# Patient Record
Sex: Male | Born: 1945 | Race: White | Hispanic: No | Marital: Married | Smoking: Former smoker
Health system: Southern US, Community
[De-identification: ages and names within clinical notes are randomized; demographics above are authoritative.]

## PROBLEM LIST (undated history)

## (undated) DIAGNOSIS — I639 Cerebral infarction, unspecified: Secondary | ICD-10-CM

## (undated) DIAGNOSIS — T8859XA Other complications of anesthesia, initial encounter: Secondary | ICD-10-CM

## (undated) DIAGNOSIS — E785 Hyperlipidemia, unspecified: Secondary | ICD-10-CM

## (undated) DIAGNOSIS — Z87898 Personal history of other specified conditions: Secondary | ICD-10-CM

## (undated) DIAGNOSIS — C801 Malignant (primary) neoplasm, unspecified: Secondary | ICD-10-CM

## (undated) DIAGNOSIS — G610 Guillain-Barre syndrome: Secondary | ICD-10-CM

## (undated) DIAGNOSIS — I10 Essential (primary) hypertension: Secondary | ICD-10-CM

## (undated) DIAGNOSIS — G4730 Sleep apnea, unspecified: Secondary | ICD-10-CM

## (undated) DIAGNOSIS — N401 Enlarged prostate with lower urinary tract symptoms: Secondary | ICD-10-CM

## (undated) DIAGNOSIS — N138 Other obstructive and reflux uropathy: Secondary | ICD-10-CM

## (undated) DIAGNOSIS — T4145XA Adverse effect of unspecified anesthetic, initial encounter: Secondary | ICD-10-CM

## (undated) DIAGNOSIS — R569 Unspecified convulsions: Secondary | ICD-10-CM

## (undated) DIAGNOSIS — M1990 Unspecified osteoarthritis, unspecified site: Secondary | ICD-10-CM

## (undated) HISTORY — PX: KNEE SURGERY: SHX244

## (undated) HISTORY — PX: HERNIA REPAIR: SHX51

## (undated) HISTORY — DX: Hyperlipidemia, unspecified: E78.5

## (undated) HISTORY — DX: Benign prostatic hyperplasia with lower urinary tract symptoms: N40.1

## (undated) HISTORY — DX: Cerebral infarction, unspecified: I63.9

## (undated) HISTORY — PX: TONSILLECTOMY: SUR1361

## (undated) HISTORY — DX: Essential (primary) hypertension: I10

## (undated) HISTORY — PX: BRAIN SURGERY: SHX531

## (undated) HISTORY — PX: MOLE REMOVAL: SHX2046

## (undated) HISTORY — PX: PILONIDAL CYST EXCISION: SHX744

## (undated) HISTORY — PX: INGUINAL HERNIA REPAIR: SUR1180

## (undated) HISTORY — DX: Other obstructive and reflux uropathy: N13.8

## (undated) HISTORY — DX: Guillain-Barre syndrome: G61.0

## (undated) HISTORY — DX: Unspecified convulsions: R56.9

---

## 1998-08-12 ENCOUNTER — Ambulatory Visit (HOSPITAL_COMMUNITY): Admission: RE | Admit: 1998-08-12 | Discharge: 1998-08-12 | Payer: Self-pay | Admitting: Family Medicine

## 1998-08-12 ENCOUNTER — Encounter: Payer: Self-pay | Admitting: Family Medicine

## 1998-09-01 ENCOUNTER — Ambulatory Visit (HOSPITAL_COMMUNITY): Admission: RE | Admit: 1998-09-01 | Discharge: 1998-09-01 | Payer: Self-pay | Admitting: Family Medicine

## 1998-09-01 ENCOUNTER — Encounter: Payer: Self-pay | Admitting: Family Medicine

## 2000-03-28 DIAGNOSIS — G610 Guillain-Barre syndrome: Secondary | ICD-10-CM

## 2000-03-28 HISTORY — DX: Guillain-Barre syndrome: G61.0

## 2004-03-28 HISTORY — PX: COLONOSCOPY: SHX174

## 2004-04-05 ENCOUNTER — Ambulatory Visit (HOSPITAL_COMMUNITY): Admission: RE | Admit: 2004-04-05 | Discharge: 2004-04-05 | Payer: Self-pay | Admitting: Internal Medicine

## 2004-09-21 ENCOUNTER — Ambulatory Visit (HOSPITAL_COMMUNITY): Admission: RE | Admit: 2004-09-21 | Discharge: 2004-09-21 | Payer: Self-pay | Admitting: Gastroenterology

## 2005-03-28 HISTORY — PX: CERVICAL FUSION: SHX112

## 2005-04-13 ENCOUNTER — Ambulatory Visit (HOSPITAL_COMMUNITY): Admission: RE | Admit: 2005-04-13 | Discharge: 2005-04-13 | Payer: Self-pay | Admitting: Neurosurgery

## 2005-05-24 ENCOUNTER — Inpatient Hospital Stay (HOSPITAL_COMMUNITY): Admission: RE | Admit: 2005-05-24 | Discharge: 2005-05-27 | Payer: Self-pay | Admitting: Neurosurgery

## 2008-03-28 HISTORY — PX: OTHER SURGICAL HISTORY: SHX169

## 2008-03-28 HISTORY — PX: BRAIN HEMATOMA EVACUATION: SHX573

## 2009-01-11 ENCOUNTER — Ambulatory Visit: Payer: Self-pay | Admitting: Cardiovascular Disease

## 2009-01-11 ENCOUNTER — Inpatient Hospital Stay (HOSPITAL_COMMUNITY): Admission: EM | Admit: 2009-01-11 | Discharge: 2009-01-15 | Payer: Self-pay | Admitting: Emergency Medicine

## 2009-01-13 ENCOUNTER — Ambulatory Visit: Payer: Self-pay | Admitting: Physical Medicine & Rehabilitation

## 2009-01-14 ENCOUNTER — Ambulatory Visit: Payer: Self-pay | Admitting: Surgery

## 2009-01-14 ENCOUNTER — Encounter (INDEPENDENT_AMBULATORY_CARE_PROVIDER_SITE_OTHER): Payer: Self-pay | Admitting: Neurology

## 2009-01-15 ENCOUNTER — Inpatient Hospital Stay (HOSPITAL_COMMUNITY)
Admission: RE | Admit: 2009-01-15 | Discharge: 2009-01-16 | Payer: Self-pay | Admitting: Physical Medicine & Rehabilitation

## 2009-01-16 ENCOUNTER — Inpatient Hospital Stay (HOSPITAL_COMMUNITY): Admission: AD | Admit: 2009-01-16 | Discharge: 2009-01-23 | Payer: Self-pay | Admitting: Neurosurgery

## 2009-01-16 ENCOUNTER — Ambulatory Visit: Payer: Self-pay | Admitting: Critical Care Medicine

## 2009-01-23 ENCOUNTER — Inpatient Hospital Stay (HOSPITAL_COMMUNITY)
Admission: RE | Admit: 2009-01-23 | Discharge: 2009-02-05 | Payer: Self-pay | Admitting: Physical Medicine & Rehabilitation

## 2009-02-10 ENCOUNTER — Encounter
Admission: RE | Admit: 2009-02-10 | Discharge: 2009-03-27 | Payer: Self-pay | Admitting: Physical Medicine & Rehabilitation

## 2009-02-25 ENCOUNTER — Ambulatory Visit (HOSPITAL_COMMUNITY): Admission: RE | Admit: 2009-02-25 | Discharge: 2009-02-25 | Payer: Self-pay | Admitting: Neurosurgery

## 2009-03-13 ENCOUNTER — Encounter
Admission: RE | Admit: 2009-03-13 | Discharge: 2009-03-19 | Payer: Self-pay | Admitting: Physical Medicine & Rehabilitation

## 2009-03-17 ENCOUNTER — Ambulatory Visit: Payer: Self-pay | Admitting: Physical Medicine & Rehabilitation

## 2009-03-29 ENCOUNTER — Emergency Department (HOSPITAL_COMMUNITY): Admission: EM | Admit: 2009-03-29 | Discharge: 2009-03-29 | Payer: Self-pay | Admitting: Emergency Medicine

## 2009-03-30 ENCOUNTER — Encounter
Admission: RE | Admit: 2009-03-30 | Discharge: 2009-04-07 | Payer: Self-pay | Admitting: Physical Medicine & Rehabilitation

## 2010-04-18 ENCOUNTER — Encounter: Payer: Self-pay | Admitting: Physical Medicine & Rehabilitation

## 2010-06-13 LAB — POCT I-STAT, CHEM 8
Calcium, Ion: 1.15 mmol/L (ref 1.12–1.32)
Chloride: 109 mEq/L (ref 96–112)
Creatinine, Ser: 1 mg/dL (ref 0.4–1.5)
Glucose, Bld: 124 mg/dL — ABNORMAL HIGH (ref 70–99)
Hemoglobin: 14.6 g/dL (ref 13.0–17.0)
Sodium: 142 mEq/L (ref 135–145)
TCO2: 25 mmol/L (ref 0–100)

## 2010-06-30 LAB — URINALYSIS, ROUTINE W REFLEX MICROSCOPIC
Bilirubin Urine: NEGATIVE
Glucose, UA: NEGATIVE mg/dL
Ketones, ur: NEGATIVE mg/dL
Specific Gravity, Urine: 1.015 (ref 1.005–1.030)
pH: 7 (ref 5.0–8.0)

## 2010-06-30 LAB — BASIC METABOLIC PANEL
BUN: 11 mg/dL (ref 6–23)
CO2: 26 mEq/L (ref 19–32)
CO2: 27 mEq/L (ref 19–32)
Calcium: 8.2 mg/dL — ABNORMAL LOW (ref 8.4–10.5)
Calcium: 8.8 mg/dL (ref 8.4–10.5)
Chloride: 101 mEq/L (ref 96–112)
Creatinine, Ser: 0.78 mg/dL (ref 0.4–1.5)
Creatinine, Ser: 0.94 mg/dL (ref 0.4–1.5)
GFR calc Af Amer: >60 mL/min (ref >60)
GFR calc non Af Amer: >60 mL/min (ref >60)
Glucose, Bld: 98 mg/dL (ref 70–99)
Sodium: 138 mEq/L (ref 135–145)

## 2010-06-30 LAB — URINE CULTURE: Colony Count: >=100000

## 2010-06-30 LAB — COMPREHENSIVE METABOLIC PANEL
Albumin: 3.1 g/dL — ABNORMAL LOW (ref 3.5–5.2)
CO2: 30 mEq/L (ref 19–32)
Chloride: 98 mEq/L (ref 96–112)
GFR calc non Af Amer: >60 mL/min (ref >60)
Glucose, Bld: 105 mg/dL — ABNORMAL HIGH (ref 70–99)
Sodium: 134 mEq/L — ABNORMAL LOW (ref 135–145)

## 2010-06-30 LAB — CBC
MCHC: 34.2 g/dL (ref 30.0–36.0)
Platelets: 475 10*3/uL — ABNORMAL HIGH (ref 150–400)
RDW: 12.1 % (ref 11.5–15.5)
WBC: 9.2 10*3/uL (ref 4.0–10.5)

## 2010-06-30 LAB — DIFFERENTIAL
Eosinophils Absolute: 0.2 10*3/uL (ref 0.0–0.7)
Eosinophils Relative: 2 % (ref 0–5)
Lymphocytes Relative: 15 % (ref 12–46)
Lymphs Abs: 1.4 10*3/uL (ref 0.7–4.0)
Monocytes Absolute: 0.7 10*3/uL (ref 0.1–1.0)
Monocytes Relative: 7 % (ref 3–12)

## 2010-07-01 LAB — URINE MICROSCOPIC-ADD ON

## 2010-07-01 LAB — CBC
HCT: 42.1 % (ref 39.0–52.0)
HCT: 42.8 % (ref 39.0–52.0)
HCT: 44 % (ref 39.0–52.0)
MCHC: 34.9 g/dL (ref 30.0–36.0)
MCHC: 35 g/dL (ref 30.0–36.0)
MCHC: 35.5 g/dL (ref 30.0–36.0)
MCV: 91 fL (ref 78.0–100.0)
MCV: 91.5 fL (ref 78.0–100.0)
MCV: 92.2 fL (ref 78.0–100.0)
Platelets: 180 10*3/uL (ref 150–400)
Platelets: 189 10*3/uL (ref 150–400)
Platelets: 197 10*3/uL (ref 150–400)
Platelets: 199 10*3/uL (ref 150–400)
Platelets: 217 10*3/uL (ref 150–400)
RDW: 11.7 % (ref 11.5–15.5)
RDW: 12.4 % (ref 11.5–15.5)
RDW: 12.5 % (ref 11.5–15.5)
WBC: 11.3 10*3/uL — ABNORMAL HIGH (ref 4.0–10.5)
WBC: 14.9 10*3/uL — ABNORMAL HIGH (ref 4.0–10.5)
WBC: 15.3 10*3/uL — ABNORMAL HIGH (ref 4.0–10.5)
WBC: 17.2 10*3/uL — ABNORMAL HIGH (ref 4.0–10.5)
WBC: 19.2 10*3/uL — ABNORMAL HIGH (ref 4.0–10.5)

## 2010-07-01 LAB — GLUCOSE, CAPILLARY
Glucose-Capillary: 116 mg/dL — ABNORMAL HIGH (ref 70–99)
Glucose-Capillary: 131 mg/dL — ABNORMAL HIGH (ref 70–99)
Glucose-Capillary: 145 mg/dL — ABNORMAL HIGH (ref 70–99)
Glucose-Capillary: 148 mg/dL — ABNORMAL HIGH (ref 70–99)
Glucose-Capillary: 167 mg/dL — ABNORMAL HIGH (ref 70–99)
Glucose-Capillary: 172 mg/dL — ABNORMAL HIGH (ref 70–99)

## 2010-07-01 LAB — DIFFERENTIAL
Basophils Absolute: 0 10*3/uL (ref 0.0–0.1)
Basophils Absolute: 0.1 10*3/uL (ref 0.0–0.1)
Basophils Relative: 0 % (ref 0–1)
Basophils Relative: 0 % (ref 0–1)
Eosinophils Absolute: 0 10*3/uL (ref 0.0–0.7)
Eosinophils Absolute: 0 10*3/uL (ref 0.0–0.7)
Eosinophils Relative: 0 % (ref 0–5)
Eosinophils Relative: 0 % (ref 0–5)
Monocytes Absolute: 1.6 10*3/uL — ABNORMAL HIGH (ref 0.1–1.0)
Monocytes Relative: 11 % (ref 3–12)
Neutrophils Relative %: 93 % — ABNORMAL HIGH (ref 43–77)

## 2010-07-01 LAB — BASIC METABOLIC PANEL
BUN: 14 mg/dL (ref 6–23)
BUN: 16 mg/dL (ref 6–23)
CO2: 21 mEq/L (ref 19–32)
CO2: 25 mEq/L (ref 19–32)
Chloride: 100 mEq/L (ref 96–112)
Chloride: 100 mEq/L (ref 96–112)
Creatinine, Ser: 0.91 mg/dL (ref 0.4–1.5)
Creatinine, Ser: 0.98 mg/dL (ref 0.4–1.5)
GFR calc Af Amer: >60 mL/min (ref >60)
Potassium: 3.2 mEq/L — ABNORMAL LOW (ref 3.5–5.1)

## 2010-07-01 LAB — URINALYSIS, ROUTINE W REFLEX MICROSCOPIC
Bilirubin Urine: NEGATIVE
Bilirubin Urine: NEGATIVE
Glucose, UA: NEGATIVE mg/dL
Ketones, ur: 15 mg/dL — AB
Ketones, ur: 15 mg/dL — AB
Leukocytes, UA: NEGATIVE
Nitrite: NEGATIVE
Protein, ur: 100 mg/dL — AB
Protein, ur: NEGATIVE mg/dL
Specific Gravity, Urine: 1.035 — ABNORMAL HIGH (ref 1.005–1.030)
Urobilinogen, UA: 1 mg/dL (ref 0.0–1.0)
pH: 5.5 (ref 5.0–8.0)

## 2010-07-01 LAB — POCT I-STAT 7, (LYTES, BLD GAS, ICA,H+H)
O2 Saturation: 100 %
Potassium: 4.2 mEq/L (ref 3.5–5.1)
Sodium: 127 mEq/L — ABNORMAL LOW (ref 135–145)
TCO2: 28 mmol/L (ref 0–100)
pCO2 arterial: 32.1 mmHg — ABNORMAL LOW (ref 35.0–45.0)

## 2010-07-01 LAB — COMPREHENSIVE METABOLIC PANEL
ALT: 28 U/L (ref 0–53)
AST: 27 U/L (ref 0–37)
AST: 33 U/L (ref 0–37)
Albumin: 2.5 g/dL — ABNORMAL LOW (ref 3.5–5.2)
Albumin: 3.5 g/dL (ref 3.5–5.2)
Albumin: 4.3 g/dL (ref 3.5–5.2)
Alkaline Phosphatase: 39 U/L (ref 39–117)
Alkaline Phosphatase: 48 U/L (ref 39–117)
BUN: 15 mg/dL (ref 6–23)
BUN: 21 mg/dL (ref 6–23)
BUN: 21 mg/dL (ref 6–23)
CO2: 25 mEq/L (ref 19–32)
Calcium: 8.7 mg/dL (ref 8.4–10.5)
Calcium: 9.1 mg/dL (ref 8.4–10.5)
Chloride: 100 mEq/L (ref 96–112)
Chloride: 107 mEq/L (ref 96–112)
Chloride: 90 mEq/L — ABNORMAL LOW (ref 96–112)
Chloride: 92 mEq/L — ABNORMAL LOW (ref 96–112)
Creatinine, Ser: 0.92 mg/dL (ref 0.4–1.5)
Creatinine, Ser: 1.1 mg/dL (ref 0.4–1.5)
GFR calc Af Amer: >60 mL/min (ref >60)
GFR calc non Af Amer: >60 mL/min (ref >60)
GFR calc non Af Amer: >60 mL/min (ref >60)
Potassium: 2.8 mEq/L — ABNORMAL LOW (ref 3.5–5.1)
Potassium: 3 mEq/L — ABNORMAL LOW (ref 3.5–5.1)
Sodium: 131 mEq/L — ABNORMAL LOW (ref 135–145)
Total Bilirubin: 0.6 mg/dL (ref 0.3–1.2)
Total Bilirubin: 0.8 mg/dL (ref 0.3–1.2)
Total Bilirubin: 1.1 mg/dL (ref 0.3–1.2)
Total Bilirubin: 1.3 mg/dL — ABNORMAL HIGH (ref 0.3–1.2)
Total Protein: 6.6 g/dL (ref 6.0–8.3)

## 2010-07-01 LAB — CROSSMATCH

## 2010-07-01 LAB — APTT
aPTT: 24 seconds (ref 24–37)
aPTT: 28 seconds (ref 24–37)

## 2010-07-01 LAB — BLOOD GAS, ARTERIAL
MECHVT: 550 mL
Patient temperature: 100.9
TCO2: 23.6 mmol/L (ref 0–100)
pH, Arterial: 7.449 (ref 7.350–7.450)

## 2010-07-01 LAB — PROTIME-INR
INR: 1 (ref 0.00–1.49)
INR: 1.03 (ref 0.00–1.49)
INR: 1.04 (ref 0.00–1.49)
Prothrombin Time: 13.1 seconds (ref 11.6–15.2)

## 2010-07-01 LAB — URINE CULTURE: Colony Count: NO GROWTH

## 2010-07-01 LAB — CARDIAC PANEL(CRET KIN+CKTOT+MB+TROPI)
Relative Index: 0.5 (ref 0.0–2.5)
Total CK: 951 U/L — ABNORMAL HIGH (ref 7–232)
Troponin I: 0.02 ng/mL (ref 0.00–0.06)

## 2010-07-01 LAB — MRSA PCR SCREENING
MRSA by PCR: NEGATIVE
MRSA by PCR: NEGATIVE

## 2010-08-13 NOTE — Op Note (Signed)
NAME:  LANGDON, CROSSON NO.:  [id number]   MEDICAL RECORD NO.:  [id number]          PATIENT TYPE:  INP   LOCATION:  3023                         FACILITY:  [facility]   PHYSICIAN:  Payton Doughty, M.D.      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   OPERATION PERFORMED:  C5-6 and C6-7 anterior cervical decompression and  fusion with a Reflex Hybrid plate.   SURGEON:  Payton Doughty, M.D.   PREP:  Sterile Betadine prep and scrub with alcohol wipe.   ASSISTANT:  Cristi Loron, M.D.   ANESTHESIA:  General endotracheal.   COMPLICATIONS:  None.   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  The  patient was taken to the operating room, smoothly anesthetized, intubated,  and placed supine on the operating table in the halter head traction.  Following shave, prep and drape in the usual sterile fashion, skin was  incised from the midline to the medial border of the sternocleidomastoid  muscle one fingerbreadth below the level of the carotid tubercle.  The  platysma was identified, elevated, divided and undermined.  Sternocleidomastoid was identified and medial dissection revealed the  carotid artery retracted laterally to the left and tracheal and esophagus  retracted laterally to the right.  This exposed the bones of the anterior  cervical spine.  Marker was placed and intraoperative x-ray was obtained to  confirm correctness of level.  Having confirmed correctness of the level,  the longus colli was taken down bilaterally and the Shadow Line self-  retaining retractor was placed.  The diskectomy was then carried out under  gross observation at 5-6 and 6-7.  The  operating microscope was then brought  in and we used microdissection technique to remove the remaining disk,  divide the posterior annulus, divide the posterior longitudinal ligament and  remove the bone spurs that were compressing the neural foramina much worse  on the right than on the left.  At both 5-6 and 6-7 there was significantly  more right-sided pathology than left and 6-7 was worse than 5-6.  Following  complete decompression, exploration of neural foramen, 7 mm bone grafts were  fashioned from patellar allograft and tapped into place.  A 34 mm Reflex  Hybrid plate was then placed with 12 mm screws, two in C5, two in C6 and two  in C7.  Intraoperative x-ray showed good placement of bone graft, plate and  screws.  The wound was irrigated, hemostasis assured, platysma and  subcutaneous tissues  reapproximated with 3-0 Vicryl in interrupted fashion.  Skin was closed with  4-0 Vicryl in running subcuticular fashion.  Benzoin and Steri-Strips were  placed and made occlusive with Telfa and OpSite.  The patient was then  transferred to the recovery room  in good condition.           ______________________________  Payton Doughty, M.D.     MWR/MEDQ  D:  05/24/2005  T:  05/25/2005  Job:  [id number]

## 2010-08-13 NOTE — H&P (Signed)
NAME:  CLEE, PANDIT NO.:  [id number]   MEDICAL RECORD NO.:  [id number]          PATIENT TYPE:  INP   LOCATION:  2899                         FACILITY:  [facility]   PHYSICIAN:  Payton Doughty, M.D.      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   SERVICE:  Neurosurgery.   A very nice now 65 year old right-handed white gentleman, had pain in his  neck out toward the right scapula. I saw him about 6 months ago but he had  developed Guillain-Barre and was looked after by Dr. Sandria Manly. His shoulder  feels better. He had dysesthesia in the right C6 and C7 distribution.  Starting in January, he had increased pain. MRI was obtained and it shows  spondylosis at 5-6 and 6-7 and he is now admitted for an anterior  decompression and fusion.   MEDICAL HISTORY:  Aside from the Guillain-Barre, his medical history is  benign. He uses an aspirin a day. Had a pilonidal cyst, a hernia operation,  knee operation in the remote past. He has no allergies.   SOCIAL HISTORY:  He does not smoke, is a social drinker, and self-employed  in the [employer].   FAMILY HISTORY:  Mom is 40 in fair health. Daddy is deceased and history is  not given.   REVIEW OF SYSTEMS:  Remarkable for wearing glasses, neck and shoulder pain.   PHYSICAL EXAMINATION:  HEENT:  Within normal limits. He has a reasonable  range of motion of his neck.  CHEST:  Clear.  CARDIAC:  Regular rate and rhythm.  ABDOMEN:  Nontender with no hepatosplenomegaly.  EXTREMITIES:  Without clubbing or cyanosis.  GENITOURINARY:  Deferred.  PERIPHERAL PULSES:  Good.  NEUROLOGIC:  He is awake, alert, and oriented. Pupils equal, round, and  reactive to light. Extraocular movements are intact, facial movement intact.  Palate elevates symmetrically. Tongue has mild fasciculations. Motor exam  shows 5/5 strength throughout the upper extremities save for slight weakness  in the right triceps. There is no reflex in the biceps or triceps.  Brachioradialis on the left is flicker. Lower extremities are  nonmyelopathic.   MRI shows significant spondylytic disease at 5-6, slightly less so at 6-7,  with neural foraminal involvement of the right side at both these levels.   CLINICAL IMPRESSION:  Cervical spondylosis with early myelopathy.   The plan is for an anterior decompression and fusion at C5-6, C6-7. The  risks and benefits of this approach have been discussed with him and he  wished to proceed.           ______________________________  Payton Doughty, M.D.     MWR/MEDQ  D:  05/24/2005  T:  05/24/2005  Job:  [id number]

## 2010-08-13 NOTE — Discharge Summary (Signed)
NAME:  Derek, Cox NO.:  [id number]   MEDICAL RECORD NO.:  [id number]           PATIENT TYPE:   LOCATION:                                 FACILITY:   PHYSICIAN:  Payton Doughty, M.D.           DATE OF BIRTH:   DATE OF ADMISSION:  05/24/2005  DATE OF DISCHARGE:  05/27/2005                                 DISCHARGE SUMMARY   ADMISSION DIAGNOSIS:  Spondylosis, C5-6, C6-7.   DISCHARGE DIAGNOSIS:  Spondylosis, C5-6, C6-7.   OPERATIVE PROCEDURE:  C5-6, C6-7 anterior cervical diskectomy and fusion  with Reflex hybrid plate.   COMPLICATIONS:  None.   DISCHARGE STATUS:  Alive and well.   OTHER DIAGNOSES:  1.  Urinary retention.  2.  Hypertension.   BODY OF TEXT:  A 65 year old right-handed white gentleman whose history and  physical is recounted on the chart.  He has cervical spondylosis, has a  neurologic exam compatible with a right C7 radiculopathy and is admitted.  He was admitted after ascertaining normal laboratory values and underwent an  anterior decompression and fusion at C5-6 and C6-7.  Postoperatively he did  reasonably well.  He developed some urinary retention.  A Foley catheter was  placed and after one day, he is now voiding normally.  His blood pressure  has also increased.  He was placed on Norvasc, initially 5 mg, then 10 mg.  Blood pressure is reasonable at this time.  On exam currently, his strength  is full, his incision is dry and well-healing.  He is being discharged home  in the care of his family, and follow-up will be in the [facility] office in two weeks for x-ray.           ______________________________  Payton Doughty, M.D.     MWR/MEDQ  D:  05/27/2005  T:  05/27/2005  Job:  [id number]

## 2010-11-16 ENCOUNTER — Other Ambulatory Visit: Payer: Self-pay | Admitting: Internal Medicine

## 2010-11-16 DIAGNOSIS — M5412 Radiculopathy, cervical region: Secondary | ICD-10-CM

## 2010-11-18 ENCOUNTER — Ambulatory Visit
Admission: RE | Admit: 2010-11-18 | Discharge: 2010-11-18 | Disposition: A | Discharge status: Home / Self Care | Payer: BC Managed Care – PPO | Source: Ambulatory Visit | Attending: Internal Medicine | Admitting: Internal Medicine

## 2010-11-18 DIAGNOSIS — M5412 Radiculopathy, cervical region: Secondary | ICD-10-CM

## 2010-11-19 ENCOUNTER — Other Ambulatory Visit: Payer: Self-pay | Admitting: Internal Medicine

## 2010-11-19 DIAGNOSIS — M542 Cervicalgia: Secondary | ICD-10-CM

## 2010-11-23 ENCOUNTER — Ambulatory Visit
Admission: RE | Admit: 2010-11-23 | Discharge: 2010-11-23 | Disposition: A | Discharge status: Home / Self Care | Payer: Medicare Other | Source: Ambulatory Visit | Attending: Internal Medicine | Admitting: Internal Medicine

## 2010-11-23 VITALS — BP 116/75 | HR 69

## 2010-11-23 DIAGNOSIS — M542 Cervicalgia: Secondary | ICD-10-CM

## 2010-11-23 MED ORDER — TRIAMCINOLONE ACETONIDE 40 MG/ML IJ SUSP (RADIOLOGY)
60.0000 mg | Freq: Once | INTRAMUSCULAR | Status: AC
  Administered 2010-11-23: 60 mg via EPIDURAL

## 2010-11-23 MED ORDER — IOHEXOL 300 MG/ML  SOLN
1.0000 mL | Freq: Once | INTRAMUSCULAR | Status: AC | PRN
  Administered 2010-11-23: 1 mL via EPIDURAL

## 2011-01-25 IMAGING — CT CT HEAD W/O CM
1 of 2 series · 13 of 30 positions shown, 17 images · non-contrast
Comparison: No similar prior study is available for comparison.

CLINICAL DATA: Right-sided weakness, lethargy

CT HEAD WITHOUT CONTRAST
TECHNIQUE: Contiguous axial images were obtained from the base of
the skull through the vertex without contrast.

[Series 2: brain · axial · 0.49mm/px · z∈[+122,+264]mm · 13 of 32 slices shown, 17 images]
[im 3/32  brain]
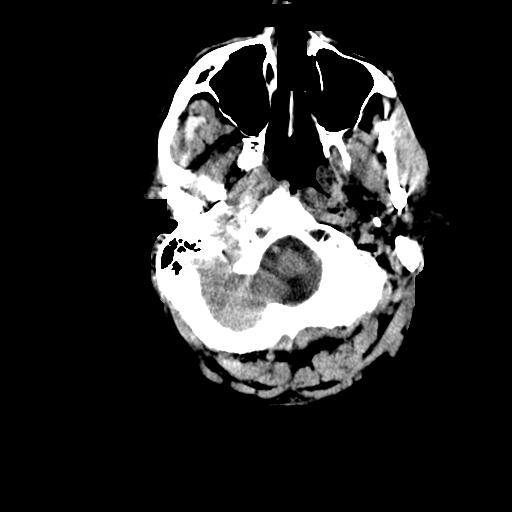
[im 3/32  bone]
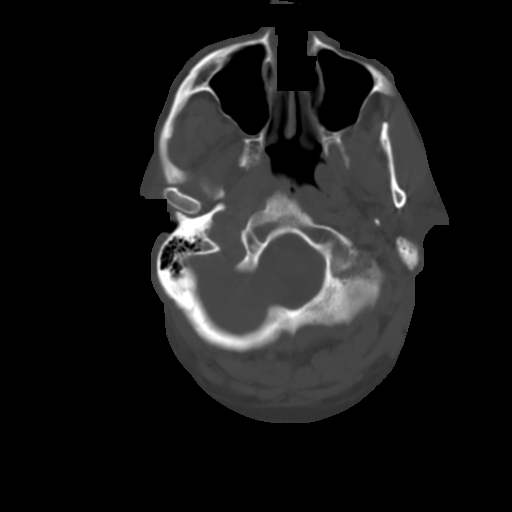
[im 5/32  brain]
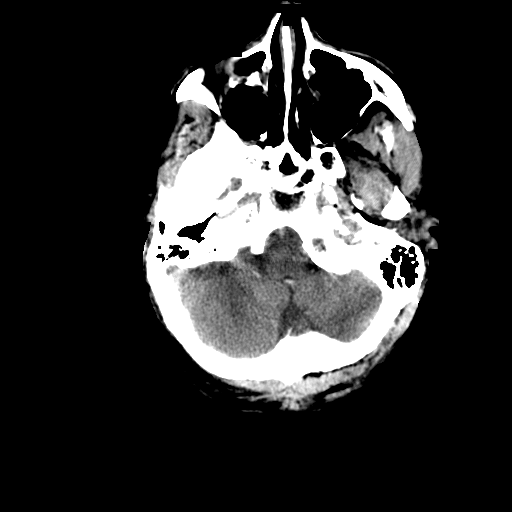
[im 7/32  brain]
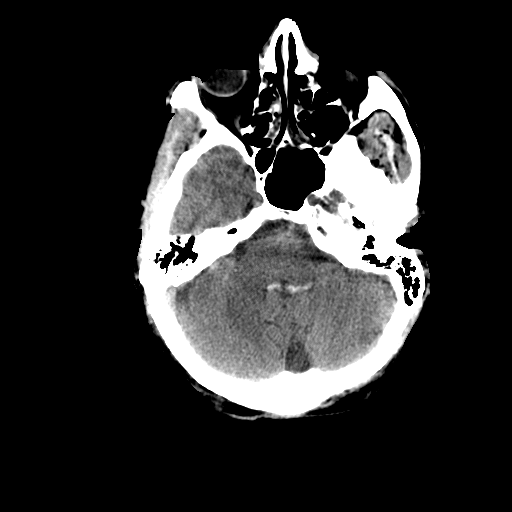
[im 9/32  brain]
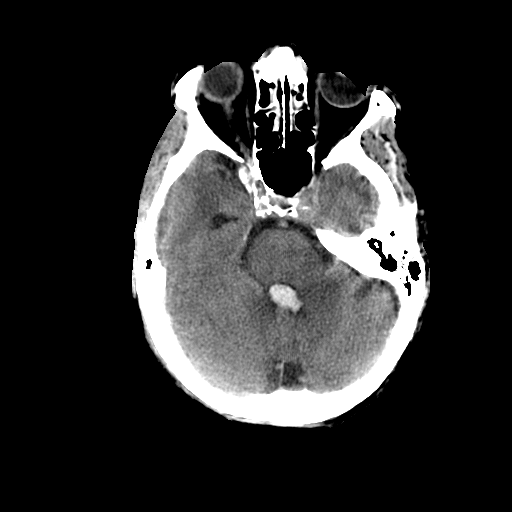
[im 12/32  brain]
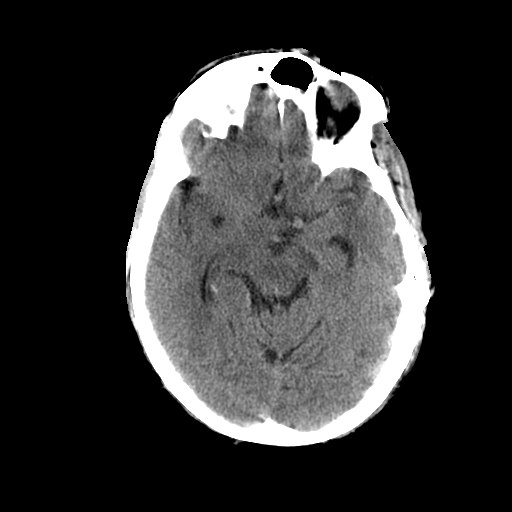
[im 12/32  bone]
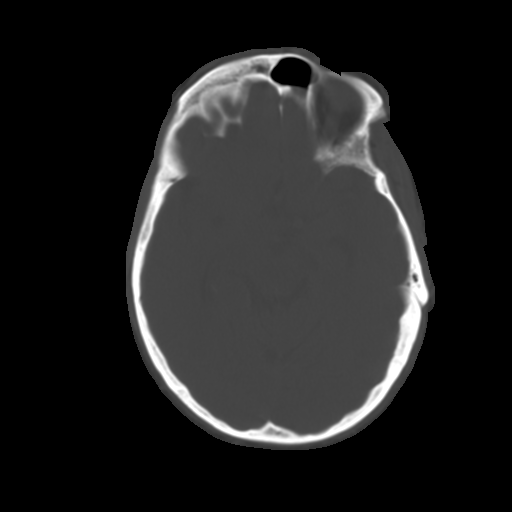
[im 14/32  brain]
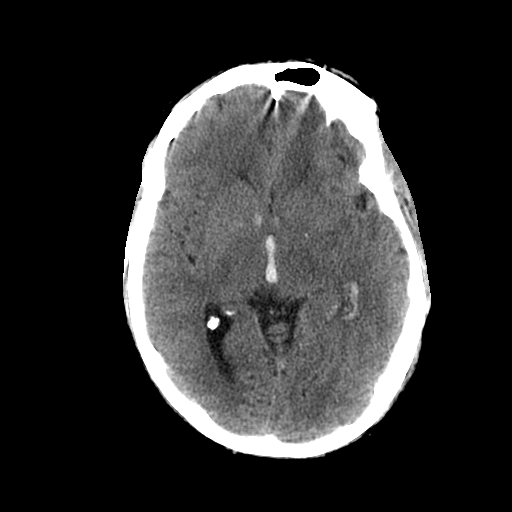
[im 16/32  brain]
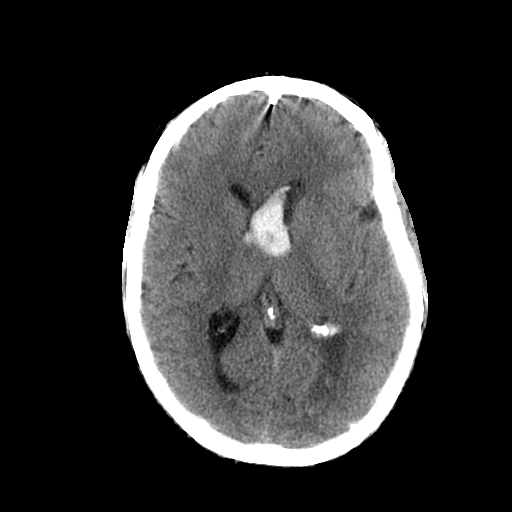
[im 18/32  brain]
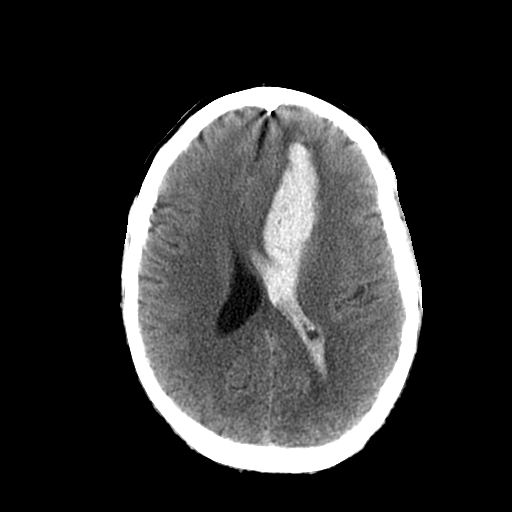
[im 20/32  brain]
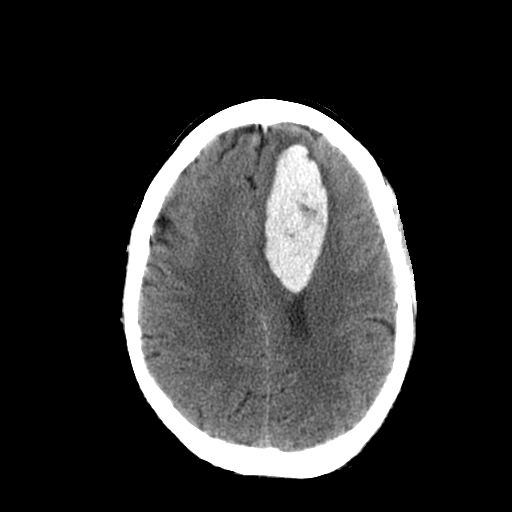
[im 20/32  bone]
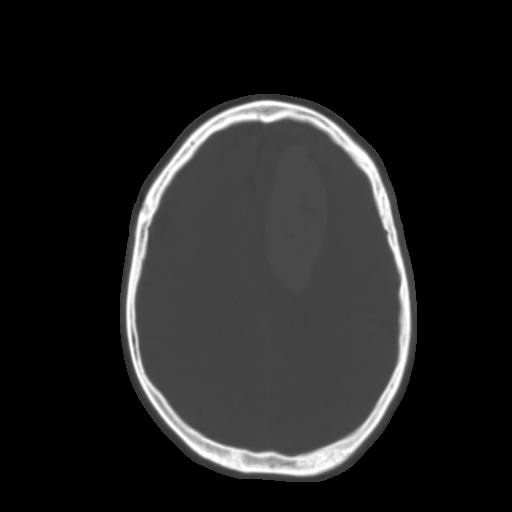
[im 23/32  brain]
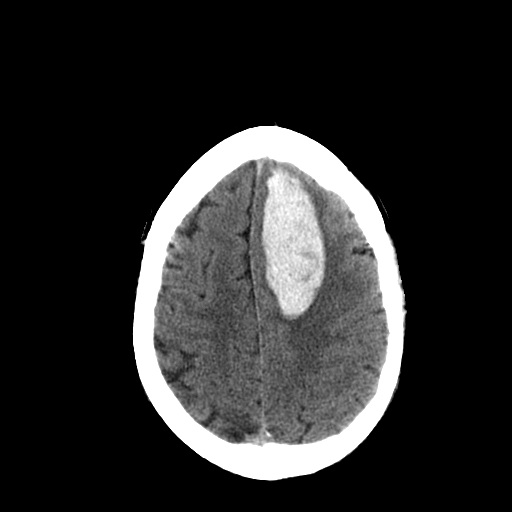
[im 25/32  brain]
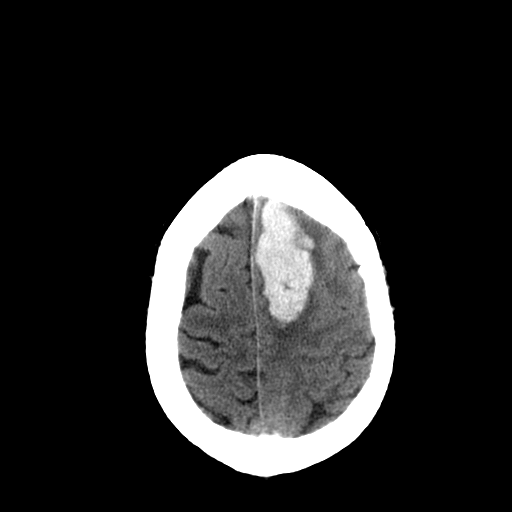
[im 27/32  brain]
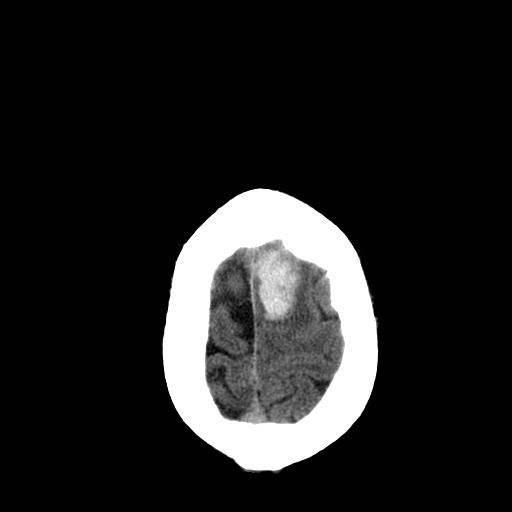
[im 29/32  brain]
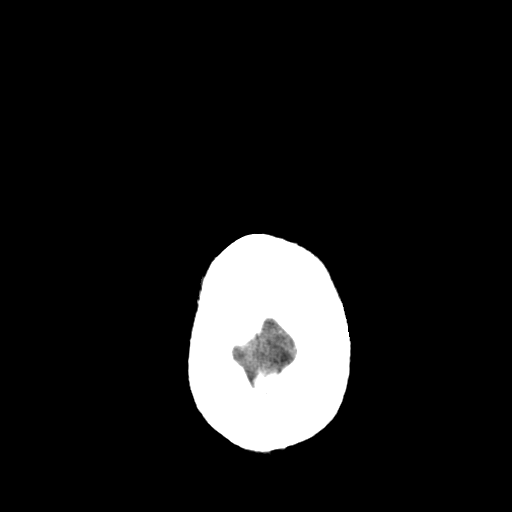
[im 29/32  bone]
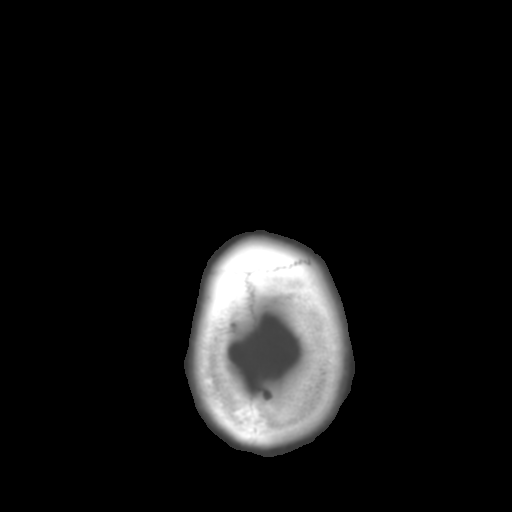

[13 of 30 positions shown; findings below may reference images not displayed]

FINDINGS: Large acute left intraparenchymal frontal hemorrhage
noted with intraventricular extension and 9 mm of rightward midline
shift.  Hemorrhage tracks to the level of the fourth ventricle.
Ethmoid sinusitis noted.  No skull fracture.
IMPRESSION: Large acute left frontal intraparenchymal and intraventricular
hemorrhage. Critical test results telephoned to Dr. Hersain by Dr.
Floky at the time of interpretation on 01/11/2009 at [DATE] p.m.

## 2011-01-25 IMAGING — CR DG CHEST 2V
2 series · 2 of 2 positions shown · non-contrast
Comparison: Chest radiograph performed 05/20/2005

CLINICAL DATA: Intracerebral hemorrhage; pain.

CHEST - 2 VIEW

[w chest pa]
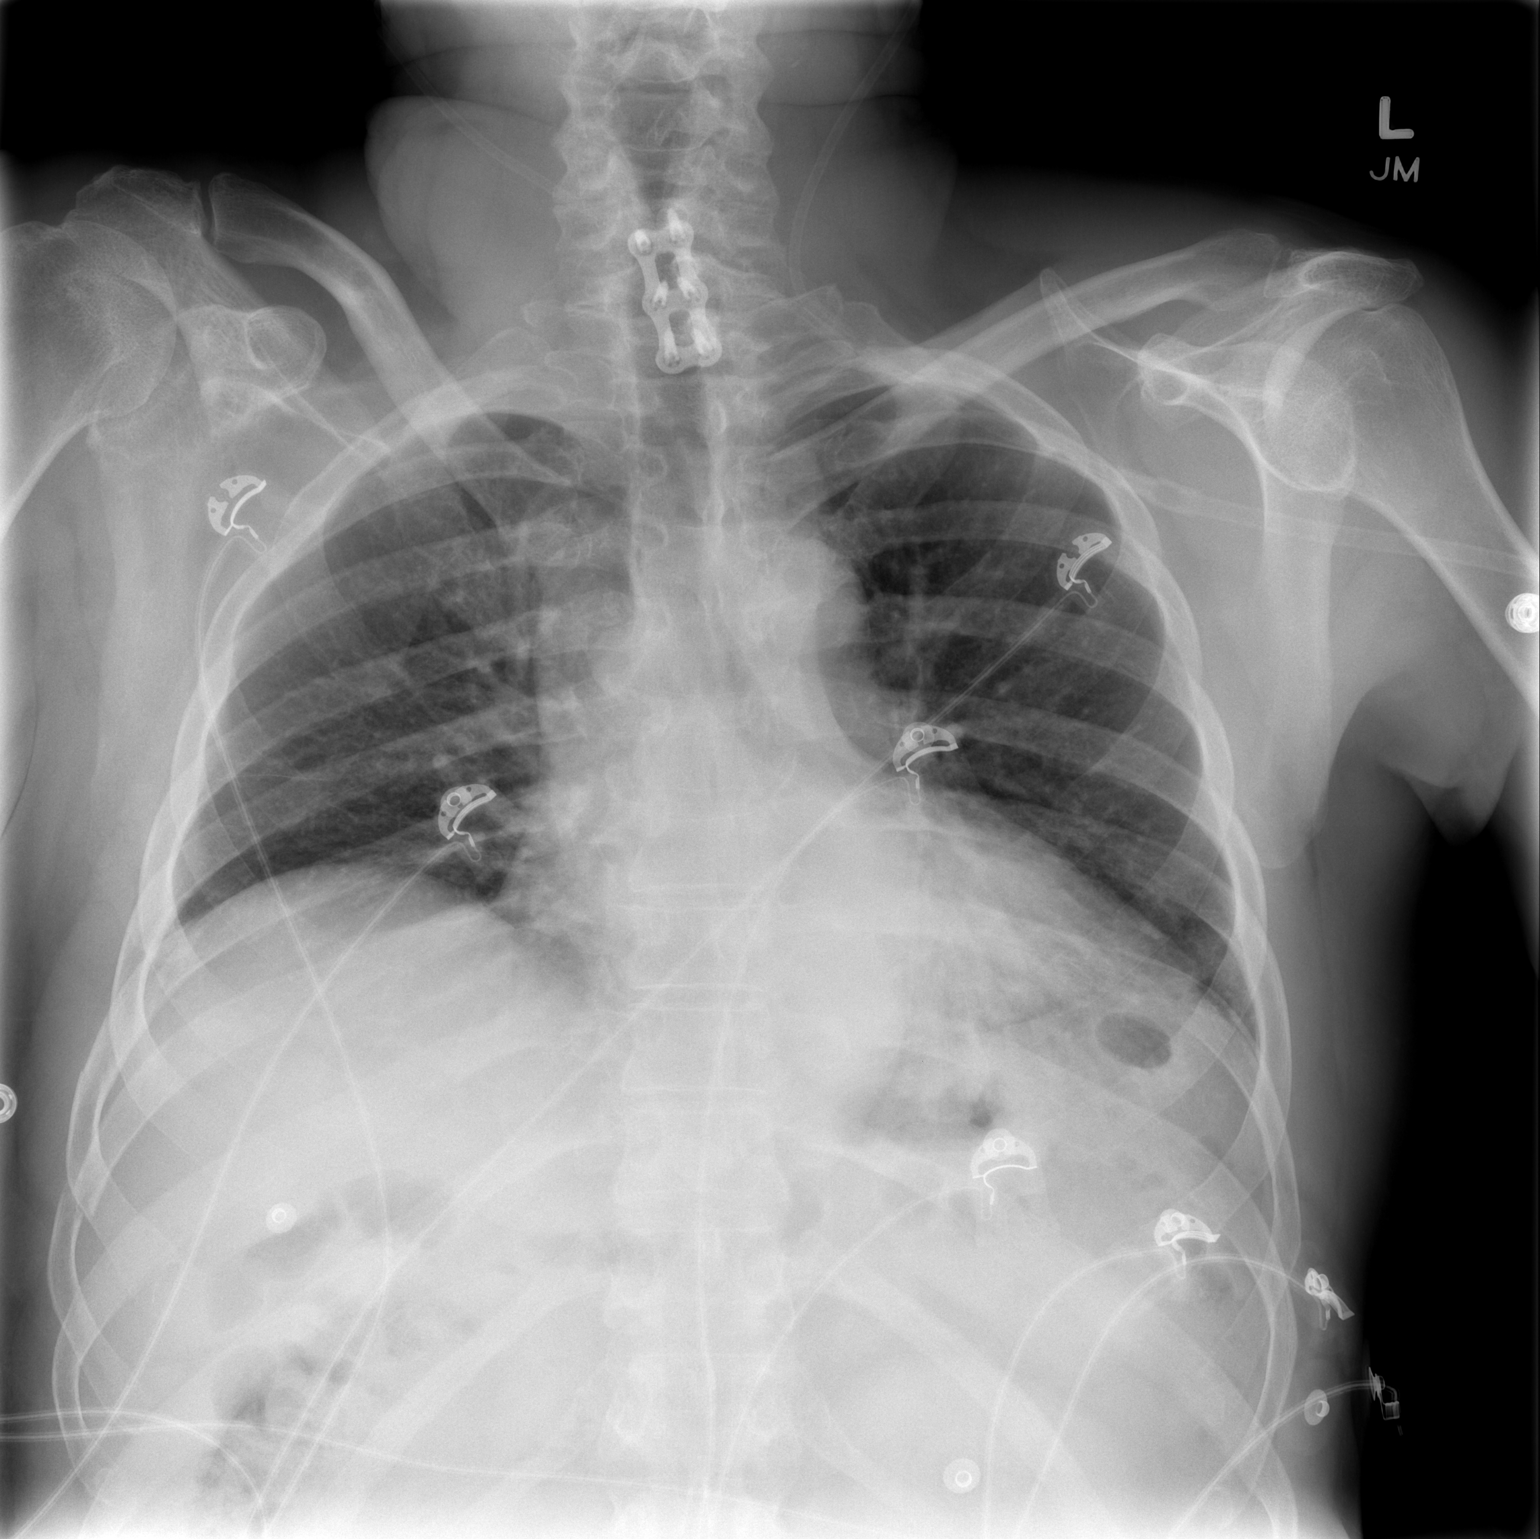

[w chest lat]
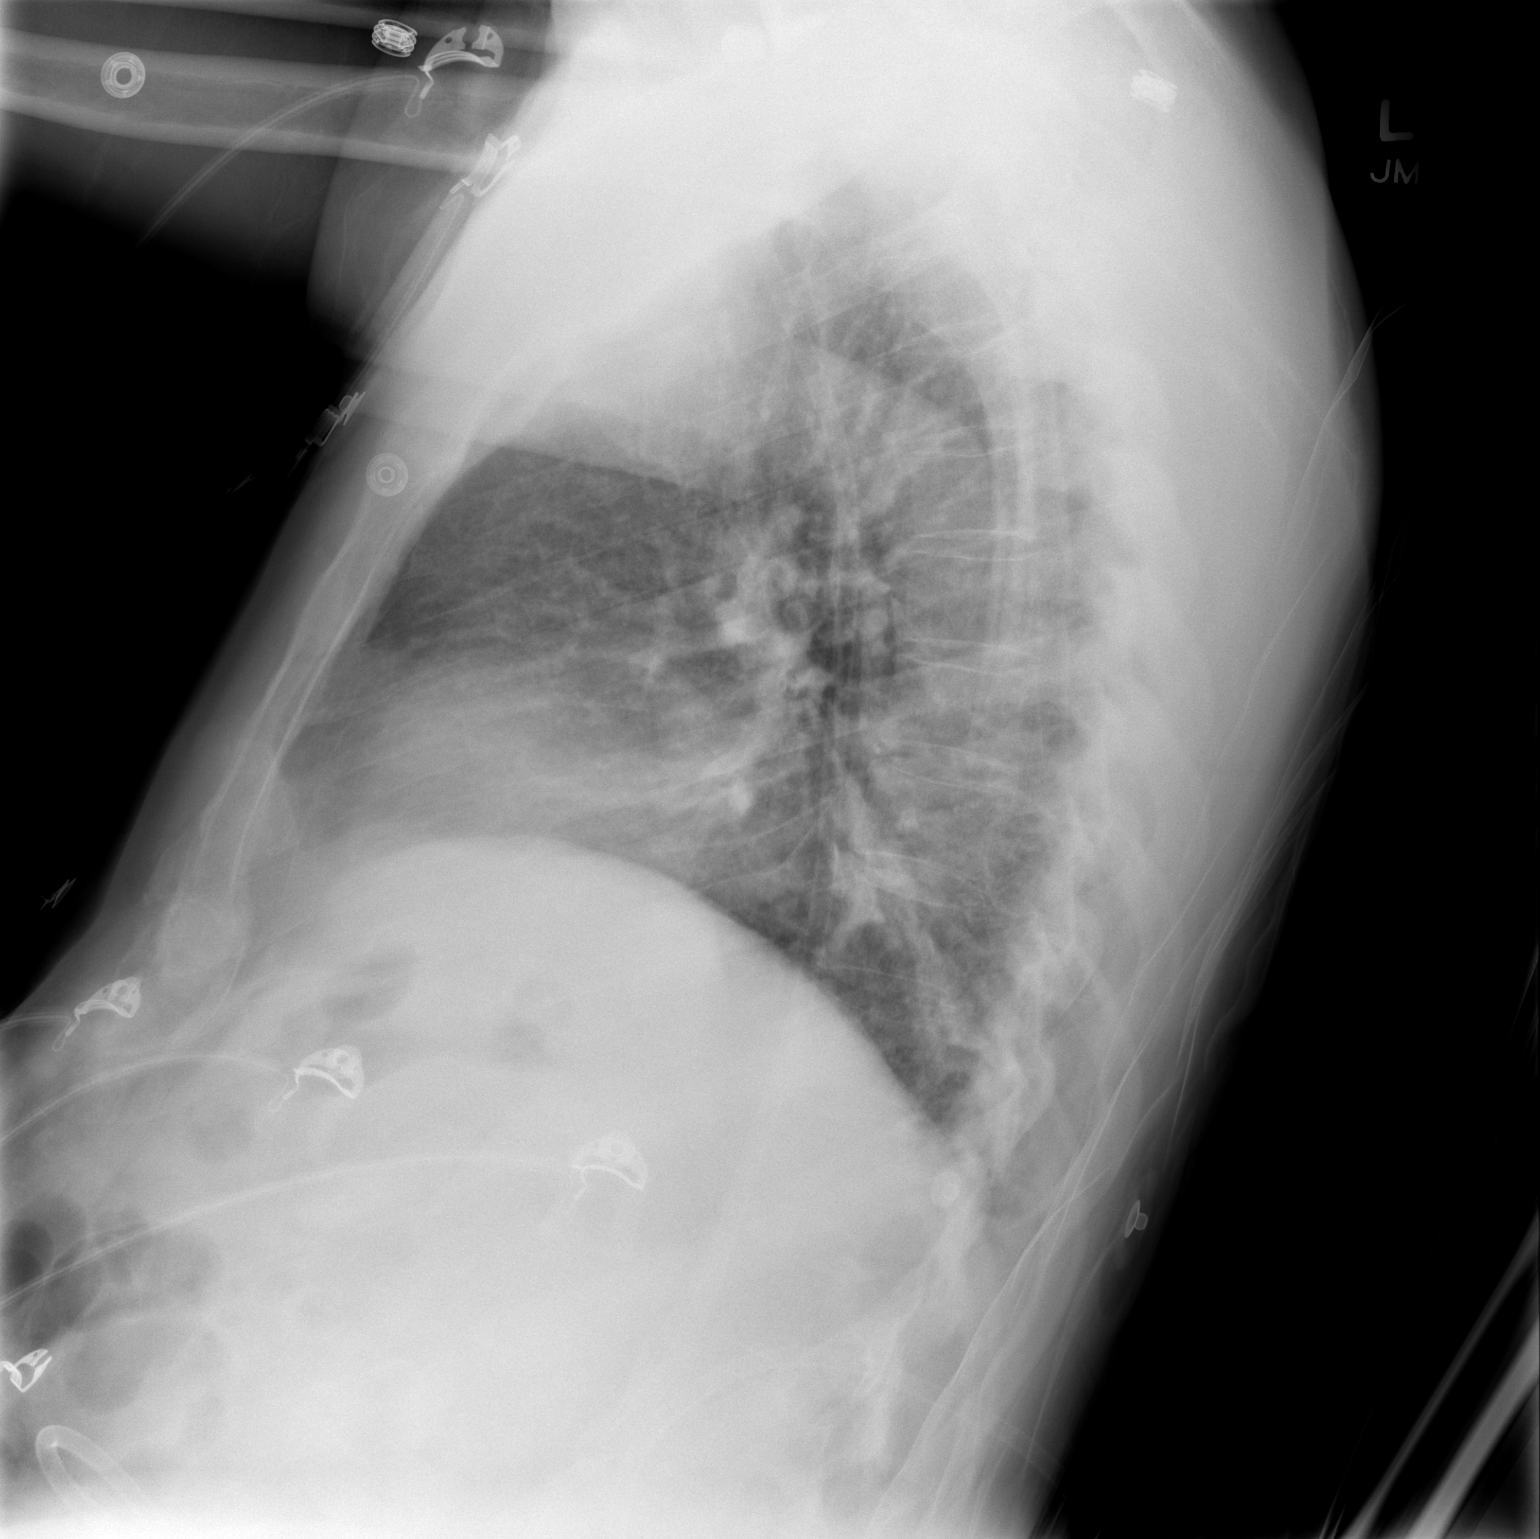

[2 of 2 positions shown; findings below may reference images not displayed]

FINDINGS: The lungs are hypoexpanded; mild bilateral atelectasis is
noted.  There is no evidence of focal opacification, pleural
effusion or pneumothorax.

The heart is mildly enlarged; the mediastinal contour remains
normal in appearance.  Note is made of mildly displaced right
lateral seventh and eighth rib fractures, new from the prior study.
The patient is status post anterior cervical spinal fusion along
the lower cervical spine.
IMPRESSION: 1.  Mildly displaced right lateral seventh and eighth rib
fractures, new from the prior study.
2.  Hypoexpanded lungs with mild bilateral atelectasis.

## 2011-04-25 DIAGNOSIS — D0460 Carcinoma in situ of skin of unspecified upper limb, including shoulder: Secondary | ICD-10-CM | POA: Diagnosis not present

## 2011-04-25 DIAGNOSIS — L259 Unspecified contact dermatitis, unspecified cause: Secondary | ICD-10-CM | POA: Diagnosis not present

## 2011-04-25 DIAGNOSIS — L918 Other hypertrophic disorders of the skin: Secondary | ICD-10-CM | POA: Diagnosis not present

## 2011-04-25 DIAGNOSIS — D692 Other nonthrombocytopenic purpura: Secondary | ICD-10-CM | POA: Diagnosis not present

## 2011-05-18 DIAGNOSIS — I10 Essential (primary) hypertension: Secondary | ICD-10-CM | POA: Diagnosis not present

## 2011-06-01 ENCOUNTER — Other Ambulatory Visit: Payer: Self-pay | Admitting: Internal Medicine

## 2011-06-01 DIAGNOSIS — M549 Dorsalgia, unspecified: Secondary | ICD-10-CM

## 2011-06-02 ENCOUNTER — Ambulatory Visit
Admission: RE | Admit: 2011-06-02 | Discharge: 2011-06-02 | Disposition: A | Discharge status: Home / Self Care | Payer: Medicare Other | Source: Ambulatory Visit | Attending: Internal Medicine | Admitting: Internal Medicine

## 2011-06-02 DIAGNOSIS — M549 Dorsalgia, unspecified: Secondary | ICD-10-CM

## 2011-06-02 DIAGNOSIS — M25519 Pain in unspecified shoulder: Secondary | ICD-10-CM | POA: Diagnosis not present

## 2011-06-02 DIAGNOSIS — M47812 Spondylosis without myelopathy or radiculopathy, cervical region: Secondary | ICD-10-CM | POA: Diagnosis not present

## 2011-06-02 DIAGNOSIS — M5020 Other cervical disc displacement, unspecified cervical region: Secondary | ICD-10-CM

## 2011-06-02 MED ORDER — TRIAMCINOLONE ACETONIDE 40 MG/ML IJ SUSP (RADIOLOGY)
60.0000 mg | Freq: Once | INTRAMUSCULAR | Status: AC
  Administered 2011-06-02: 60 mg via EPIDURAL

## 2011-06-02 MED ORDER — IOHEXOL 300 MG/ML  SOLN
1.0000 mL | Freq: Once | INTRAMUSCULAR | Status: AC | PRN
  Administered 2011-06-02: 1 mL via EPIDURAL

## 2011-06-02 NOTE — Discharge Instructions (Signed)

## 2011-06-28 ENCOUNTER — Other Ambulatory Visit: Payer: Self-pay | Admitting: Internal Medicine

## 2011-06-28 DIAGNOSIS — M542 Cervicalgia: Secondary | ICD-10-CM

## 2011-06-30 ENCOUNTER — Ambulatory Visit
Admission: RE | Admit: 2011-06-30 | Discharge: 2011-06-30 | Disposition: A | Discharge status: Home / Self Care | Payer: Medicare Other | Source: Ambulatory Visit | Attending: Internal Medicine | Admitting: Internal Medicine

## 2011-06-30 VITALS — BP 134/84 | HR 69

## 2011-06-30 DIAGNOSIS — M47812 Spondylosis without myelopathy or radiculopathy, cervical region: Secondary | ICD-10-CM | POA: Diagnosis not present

## 2011-06-30 DIAGNOSIS — M542 Cervicalgia: Secondary | ICD-10-CM

## 2011-06-30 MED ORDER — TRIAMCINOLONE ACETONIDE 40 MG/ML IJ SUSP (RADIOLOGY)
60.0000 mg | Freq: Once | INTRAMUSCULAR | Status: AC
  Administered 2011-06-30: 60 mg via EPIDURAL

## 2011-06-30 MED ORDER — IOHEXOL 300 MG/ML  SOLN
1.0000 mL | Freq: Once | INTRAMUSCULAR | Status: AC | PRN
  Administered 2011-06-30: 1 mL via EPIDURAL

## 2011-07-07 ENCOUNTER — Ambulatory Visit (INDEPENDENT_AMBULATORY_CARE_PROVIDER_SITE_OTHER): Payer: Medicare Other | Admitting: General Surgery

## 2011-07-07 ENCOUNTER — Ambulatory Visit (INDEPENDENT_AMBULATORY_CARE_PROVIDER_SITE_OTHER): Payer: Self-pay | Admitting: General Surgery

## 2011-07-07 ENCOUNTER — Encounter (INDEPENDENT_AMBULATORY_CARE_PROVIDER_SITE_OTHER): Payer: Self-pay | Admitting: General Surgery

## 2011-07-07 VITALS — BP 134/86 | HR 87 | Temp 97.7°F | Ht 69.0 in | Wt 168.8 lb

## 2011-07-07 DIAGNOSIS — K4090 Unilateral inguinal hernia, without obstruction or gangrene, not specified as recurrent: Secondary | ICD-10-CM | POA: Diagnosis not present

## 2011-07-07 NOTE — Progress Notes (Signed)
Patient ID: Derek Amon., male   DOB: [birth year], 66 y.o.   MRN: [id number]  Chief Complaint  Patient presents with  . Pre-op Exam    eval RIH    HPI Derek L Riven Mabile. is a 66 y.o. male.   HPI This patient is referred by Dr. Valentina Lucks for evaluation of a newly diagnosed right inguinal hernia. He first noticed this bulge approximately one month ago and noticed that it was around a golf ball size initially. Over the last few days he says that this has decreased in size but he does have some discomfort with palpation. Otherwise, he states that he has minimal discomfort and describes this as a "nagging" he denies any obstructive symptoms such as abdominal pain, nausea, vomiting, or obstipation. He does state that he had a prior hernia repair approximately 20 years ago and he thinks that mesh was included but he does not remember which side and the particular details.  Past Medical History  Diagnosis Date  . Hypertension   . Hyperlipidemia   . Seizures   . Stroke     Past Surgical History  Procedure Date  . Pilonidal cyst excision   . Knee surgery   . Tonsillectomy   . Mole removal     non melanoma  . Ich evacuation 2010  . Cervical fusion 2007    History reviewed. No pertinent family history.  Social History History  Substance Use Topics  . Smoking status: Former Smoker -- 0.5 packs/day for 20 years    Types: Cigarettes  . Smokeless tobacco: Former Neurosurgeon    Quit date: 07/07/1998  . Alcohol Use: 0.0 oz/week    1-2 Cans of beer per week     per week    Allergies  Allergen Reactions  . Oxycodone Itching    Current Outpatient Prescriptions  Medication Sig Dispense Refill  . amLODipine (NORVASC) 10 MG tablet Take 10 mg by mouth daily.      Marland Kitchen aspirin 81 MG tablet Take 81 mg by mouth daily.      Marland Kitchen levETIRAcetam (KEPPRA) 500 MG tablet Take 500 mg by mouth every 12 (twelve) hours.      Marland Kitchen lisinopril (PRINIVIL,ZESTRIL) 10 MG tablet Take 10 mg by mouth daily.      .  simvastatin (ZOCOR) 20 MG tablet Take 20 mg by mouth every evening.      . zolpidem (AMBIEN) 5 MG tablet Take 5 mg by mouth at bedtime as needed.        Review of Systems Review of Systems All other review of systems negative or noncontributory except as stated in the HPI  Blood pressure 134/86, pulse 87, temperature 97.7 F (36.5 C), temperature source Temporal, height 5\' 9"  (1.753 m), weight 168 lb 12.8 oz (76.567 kg), SpO2 97.00%.  Physical Exam Physical Exam Physical Exam  Vitals reviewed. Constitutional: He is oriented to person, place, and time. He appears well-developed and well-nourished. No distress.  HENT:  Head: Normocephalic and atraumatic.  Mouth/Throat: No oropharyngeal exudate.  Eyes: Conjunctivae and EOM are normal. Pupils are equal, round, and reactive to light. Right eye exhibits no discharge. Left eye exhibits no discharge. No scleral icterus.  Neck: Normal range of motion. No tracheal deviation present.  Cardiovascular: Normal rate, regular rhythm and normal heart sounds.   Pulmonary/Chest: Effort normal and breath sounds normal. No stridor. No respiratory distress. He has no wheezes. He has no rales. He exhibits no tenderness.  Abdominal: Soft. Bowel sounds are normal.  He exhibits no distension and no mass. There is no tenderness. There is no rebound and no guarding. Reducible RIH but tender with reduction.  No LIH or skin changes.  I do not see a scar from any prior procedures. Musculoskeletal: Normal range of motion. He exhibits no edema and no tenderness.  Neurological: He is alert and oriented to person, place, and time.  Skin: Skin is warm and dry. No rash noted. He is not diaphoretic. No erythema. No pallor.  Psychiatric: He has a normal mood and affect. His behavior is normal. Judgment and thought content normal.    Data Reviewed   Assessment    Reducible right inguinal hernia I do not see any evidence of strangulation or incarceration. The hernia is  reducible on exam today although he does have some tenderness with this. I discussed with him the options for watchful waiting versus laparoscopic or open repair and he would like to proceed with surgical repair of his right inguinal hernia. Given the fact that he has had a prior open inguinal hernia repair with mesh we have decided to proceed with laparoscopic repair although I'm not sure which side this repair was on it I could not see an obvious scar today in the clinic. I discussed with him the risks of the procedure including infection, bleeding, pain, scarring, recurrence, nerve injury, injury to testicle or vas deferens and chronic pain and expressed understanding and would like to proceed with laparoscopic right inguinal hernia repair with mesh    Plan    Will proceed with lap RIH with mesh possible open.       Lodema Pilot DAVID 07/07/2011, 12:54 PM

## 2011-08-01 ENCOUNTER — Encounter (HOSPITAL_COMMUNITY): Payer: Self-pay | Admitting: Respiratory Therapy

## 2011-08-09 ENCOUNTER — Encounter (HOSPITAL_COMMUNITY)
Admission: RE | Admit: 2011-08-09 | Discharge: 2011-08-09 | Disposition: A | Discharge status: Home / Self Care | Payer: Medicare Other | Source: Ambulatory Visit | Attending: General Surgery | Admitting: General Surgery

## 2011-08-09 ENCOUNTER — Encounter (HOSPITAL_COMMUNITY): Payer: Self-pay

## 2011-08-09 ENCOUNTER — Encounter (HOSPITAL_COMMUNITY)
Admission: RE | Admit: 2011-08-09 | Discharge: 2011-08-09 | Disposition: A | Discharge status: Home / Self Care | Payer: Medicare Other | Source: Ambulatory Visit | Attending: Anesthesiology | Admitting: Anesthesiology

## 2011-08-09 VITALS — BP 124/85 | HR 78 | Temp 97.0°F | Resp 20 | Ht 69.0 in | Wt 171.0 lb

## 2011-08-09 DIAGNOSIS — Z01818 Encounter for other preprocedural examination: Secondary | ICD-10-CM | POA: Diagnosis not present

## 2011-08-09 DIAGNOSIS — Z01812 Encounter for preprocedural laboratory examination: Secondary | ICD-10-CM | POA: Diagnosis not present

## 2011-08-09 DIAGNOSIS — Z01810 Encounter for preprocedural cardiovascular examination: Secondary | ICD-10-CM | POA: Diagnosis not present

## 2011-08-09 DIAGNOSIS — I10 Essential (primary) hypertension: Secondary | ICD-10-CM | POA: Diagnosis not present

## 2011-08-09 DIAGNOSIS — Z8673 Personal history of transient ischemic attack (TIA), and cerebral infarction without residual deficits: Secondary | ICD-10-CM | POA: Diagnosis not present

## 2011-08-09 DIAGNOSIS — Z01811 Encounter for preprocedural respiratory examination: Secondary | ICD-10-CM | POA: Diagnosis not present

## 2011-08-09 DIAGNOSIS — K4090 Unilateral inguinal hernia, without obstruction or gangrene, not specified as recurrent: Secondary | ICD-10-CM

## 2011-08-09 HISTORY — DX: Sleep apnea, unspecified: G47.30

## 2011-08-09 LAB — SURGICAL PCR SCREEN: MRSA, PCR: NEGATIVE

## 2011-08-09 LAB — CBC
Hemoglobin: 16.3 g/dL (ref 13.0–17.0)
MCH: 31.9 pg (ref 26.0–34.0)
MCHC: 35.7 g/dL (ref 30.0–36.0)
MCV: 89.4 fL (ref 78.0–100.0)
RBC: 5.11 MIL/uL (ref 4.22–5.81)

## 2011-08-09 LAB — BASIC METABOLIC PANEL
BUN: 13 mg/dL (ref 6–23)
CO2: 26 mEq/L (ref 19–32)
GFR calc non Af Amer: 75 mL/min — ABNORMAL LOW (ref >90)
Glucose, Bld: 98 mg/dL (ref 70–99)
Potassium: 4.1 mEq/L (ref 3.5–5.1)
Sodium: 141 mEq/L (ref 135–145)

## 2011-08-09 NOTE — Pre-Procedure Instructions (Signed)
20 Dione L Kourtney Montesinos.  08/09/2011   Your procedure is scheduled on:  08/11/2011  Report to [facility] at 8:55 AM.  Call this number if you have problems the morning of surgery: [phone]   Remember:   Do not eat food:After Midnight. [facility]  May have clear liquids: up to 4 Hours before arrival.  Nothing after 4:55a.m.   Clear liquids include soda, tea, black coffee, apple or grape juice, broth.  Take these medicines the morning of surgery with A SIP OF WATER: Norvasc, Keppra    Do not wear jewelry, make-up or nail polish.  Do not wear lotions, powders, or perfumes. You may wear deodorant.  Do not shave 48 hours prior to surgery. Men may shave face and neck.  Do not bring valuables to the hospital.  Contacts, dentures or bridgework may not be worn into surgery.  Leave suitcase in the car. After surgery it may be brought to your room.  For patients admitted to the hospital, checkout time is 11:00 AM the day of discharge.   Patients discharged the day of surgery will not be allowed to drive home.  Name and phone number of your driver: with WIFE  Special Instructions: CHG Shower Use Special Wash: 1/2 bottle night before surgery and 1/2 bottle morning of surgery.   Please read over the following fact sheets that you were given: Pain Booklet, Coughing and Deep Breathing, MRSA Information and Surgical Site Infection Prevention

## 2011-08-09 NOTE — Progress Notes (Signed)
Call to A. Zelenak,PAC, reviewed pt. History in reference to MD order for anesth. Review.  Will leave chart for A. Zelenak , PAC to review chart .

## 2011-08-10 MED ORDER — CEFAZOLIN SODIUM-DEXTROSE 2-3 GM-% IV SOLR
2.0000 g | INTRAVENOUS | Status: DC
  Filled 2011-08-10: qty 50

## 2011-08-10 NOTE — Consult Note (Signed)
Anesthesia Chart Review:  Patient is a 66 year old male scheduled for laparoscopic right IHR with mesh on 08/11/11.  History includes former smoker, OSA, HTN, seizures, hemorrhagic CVA with right sided weakness (left frontal intracranial hemorrhage 12/2008 s/p evacuation) s/p rehab, C5-7 fusion '07. PCP is Dr. Valentina Lucks.  EKG from 08/09/11 showed NSR.  Echo from 01/14/09 showed: 1. Left ventricle: The cavity size was normal. Systolic function was normal. The estimated ejection fraction was in the range of 55% to 60%. Wall motion was normal; there were no regional wall motion abnormalities. 2. Impressions: Chordal SAM (systolic anterior motion) but no LVOT gradient.  CXR from 08/09/11 showed: The lungs are clear. Mild cardiomegaly is stable. No  acute bony abnormality is seen. A lower anterior cervical spine fusion plate is present.   Labs acceptable.  Anticipate he can proceed as planned.  Shonna Chock, PA-C

## 2011-08-11 ENCOUNTER — Encounter (HOSPITAL_COMMUNITY): Payer: Self-pay | Admitting: *Deleted

## 2011-08-11 ENCOUNTER — Ambulatory Visit (HOSPITAL_COMMUNITY): Payer: Medicare Other | Admitting: Vascular Surgery

## 2011-08-11 ENCOUNTER — Encounter (HOSPITAL_COMMUNITY): Payer: Self-pay | Admitting: Vascular Surgery

## 2011-08-11 ENCOUNTER — Encounter (HOSPITAL_COMMUNITY)
Admission: RE | Disposition: A | Discharge status: Home / Self Care | Payer: Self-pay | Source: Ambulatory Visit | Attending: General Surgery

## 2011-08-11 ENCOUNTER — Ambulatory Visit (HOSPITAL_COMMUNITY)
Admission: RE | Admit: 2011-08-11 | Discharge: 2011-08-11 | Disposition: A | Discharge status: Home / Self Care | Payer: Medicare Other | Source: Ambulatory Visit | Attending: General Surgery | Admitting: General Surgery

## 2011-08-11 ENCOUNTER — Telehealth (INDEPENDENT_AMBULATORY_CARE_PROVIDER_SITE_OTHER): Payer: Self-pay | Admitting: Surgery

## 2011-08-11 DIAGNOSIS — K4090 Unilateral inguinal hernia, without obstruction or gangrene, not specified as recurrent: Secondary | ICD-10-CM

## 2011-08-11 DIAGNOSIS — I10 Essential (primary) hypertension: Secondary | ICD-10-CM | POA: Insufficient documentation

## 2011-08-11 DIAGNOSIS — Z01810 Encounter for preprocedural cardiovascular examination: Secondary | ICD-10-CM | POA: Diagnosis not present

## 2011-08-11 DIAGNOSIS — Z01812 Encounter for preprocedural laboratory examination: Secondary | ICD-10-CM | POA: Insufficient documentation

## 2011-08-11 DIAGNOSIS — Z01818 Encounter for other preprocedural examination: Secondary | ICD-10-CM | POA: Diagnosis not present

## 2011-08-11 DIAGNOSIS — Z8673 Personal history of transient ischemic attack (TIA), and cerebral infarction without residual deficits: Secondary | ICD-10-CM | POA: Insufficient documentation

## 2011-08-11 HISTORY — PX: INGUINAL HERNIA REPAIR: SHX194

## 2011-08-11 SURGERY — REPAIR, HERNIA, INGUINAL, LAPAROSCOPIC
Anesthesia: General | Site: Groin | Laterality: Right | Wound class: Clean

## 2011-08-11 MED ORDER — HYDROMORPHONE HCL PF 1 MG/ML IJ SOLN
0.2500 mg | INTRAMUSCULAR | Status: DC | PRN
  Administered 2011-08-11: 0.5 mg via INTRAVENOUS

## 2011-08-11 MED ORDER — OXYCODONE-ACETAMINOPHEN 7.5-325 MG PO TABS: 1.0000 | ORAL_TABLET | Freq: Four times a day (QID) | ORAL | Status: AC | PRN

## 2011-08-11 MED ORDER — KETOROLAC TROMETHAMINE 30 MG/ML IJ SOLN
INTRAMUSCULAR | Status: DC | PRN
  Administered 2011-08-11: 15 mg via INTRAVENOUS

## 2011-08-11 MED ORDER — ROCURONIUM BROMIDE 100 MG/10ML IV SOLN
INTRAVENOUS | Status: DC | PRN
  Administered 2011-08-11: 50 mg via INTRAVENOUS
  Administered 2011-08-11 (×2): 10 mg via INTRAVENOUS

## 2011-08-11 MED ORDER — ONDANSETRON HCL 4 MG/2ML IJ SOLN
INTRAMUSCULAR | Status: DC | PRN
  Administered 2011-08-11: 4 mg via INTRAVENOUS

## 2011-08-11 MED ORDER — KETOROLAC TROMETHAMINE 30 MG/ML IJ SOLN: 15.0000 mg | Freq: Once | INTRAMUSCULAR | Status: DC | PRN

## 2011-08-11 MED ORDER — BUPIVACAINE HCL (PF) 0.25 % IJ SOLN
INTRAMUSCULAR | Status: DC | PRN
  Administered 2011-08-11: 30 mL

## 2011-08-11 MED ORDER — LIDOCAINE HCL (CARDIAC) 20 MG/ML IV SOLN
INTRAVENOUS | Status: DC | PRN
  Administered 2011-08-11: 80 mg via INTRAVENOUS

## 2011-08-11 MED ORDER — PROMETHAZINE HCL 25 MG/ML IJ SOLN
6.2500 mg | INTRAMUSCULAR | Status: DC | PRN
Start: 2011-08-11 — End: 2011-08-11

## 2011-08-11 MED ORDER — 0.9 % SODIUM CHLORIDE (POUR BTL) OPTIME
TOPICAL | Status: DC | PRN
  Administered 2011-08-11: 1000 mL

## 2011-08-11 MED ORDER — NEOSTIGMINE METHYLSULFATE 1 MG/ML IJ SOLN
INTRAMUSCULAR | Status: DC | PRN
  Administered 2011-08-11: 3.5 mg via INTRAVENOUS

## 2011-08-11 MED ORDER — MIDAZOLAM HCL 5 MG/5ML IJ SOLN
INTRAMUSCULAR | Status: DC | PRN
  Administered 2011-08-11: 2 mg via INTRAVENOUS

## 2011-08-11 MED ORDER — LIDOCAINE-EPINEPHRINE 1 %-1:100000 IJ SOLN
INTRAMUSCULAR | Status: DC | PRN
  Administered 2011-08-11: 30 mL

## 2011-08-11 MED ORDER — FENTANYL CITRATE 0.05 MG/ML IJ SOLN
INTRAMUSCULAR | Status: DC | PRN
  Administered 2011-08-11 (×2): 50 ug via INTRAVENOUS
  Administered 2011-08-11: 100 ug via INTRAVENOUS
  Administered 2011-08-11: 50 ug via INTRAVENOUS

## 2011-08-11 MED ORDER — FIBRIN SEALANT COMPONENT 5 ML EX KIT
PACK | CUTANEOUS | Status: DC | PRN
  Administered 2011-08-11: 5 mL

## 2011-08-11 MED ORDER — DEXAMETHASONE SODIUM PHOSPHATE 4 MG/ML IJ SOLN
INTRAMUSCULAR | Status: DC | PRN
  Administered 2011-08-11: 4 mg via INTRAVENOUS

## 2011-08-11 MED ORDER — LACTATED RINGERS IV SOLN
INTRAVENOUS | Status: DC
  Administered 2011-08-11 (×2): via INTRAVENOUS

## 2011-08-11 MED ORDER — PROPOFOL 10 MG/ML IV EMUL
INTRAVENOUS | Status: DC | PRN
  Administered 2011-08-11: 150 mg via INTRAVENOUS

## 2011-08-11 MED ORDER — GLYCOPYRROLATE 0.2 MG/ML IJ SOLN
INTRAMUSCULAR | Status: DC | PRN
  Administered 2011-08-11: .7 mg via INTRAVENOUS

## 2011-08-11 MED ORDER — SODIUM CHLORIDE 0.9 % IR SOLN
Status: DC | PRN
  Administered 2011-08-11: 1000 mL

## 2011-08-11 SURGICAL SUPPLY — 50 items
APPLICATOR COTTON TIP 6IN STRL (MISCELLANEOUS) ×2 IMPLANT
APPLIER CLIP 5 13 M/L LIGAMAX5 (MISCELLANEOUS)
APPLIER CLIP ROT 10 11.4 M/L (STAPLE)
BENZOIN TINCTURE PRP APPL 2/3 (GAUZE/BANDAGES/DRESSINGS) IMPLANT
BLADE SURG 12 STRL SS (BLADE) ×2 IMPLANT
BLADE SURG ROTATE 9660 (MISCELLANEOUS) ×2 IMPLANT
CANISTER SUCTION 2500CC (MISCELLANEOUS) ×2 IMPLANT
CHLORAPREP W/TINT 26ML (MISCELLANEOUS) ×2 IMPLANT
CLIP APPLIE 5 13 M/L LIGAMAX5 (MISCELLANEOUS) IMPLANT
CLIP APPLIE ROT 10 11.4 M/L (STAPLE) IMPLANT
CLOTH BEACON ORANGE TIMEOUT ST (SAFETY) ×2 IMPLANT
COVER SURGICAL LIGHT HANDLE (MISCELLANEOUS) ×2 IMPLANT
DECANTER SPIKE VIAL GLASS SM (MISCELLANEOUS) ×2 IMPLANT
DERMABOND ADVANCED (GAUZE/BANDAGES/DRESSINGS) ×1
DERMABOND ADVANCED .7 DNX12 (GAUZE/BANDAGES/DRESSINGS) ×1 IMPLANT
DEVICE SECURE STRAP 25 ABSORB (INSTRUMENTS) ×2 IMPLANT
DISSECT BALLN SPACEMKR + OVL (BALLOONS) ×2
DISSECTOR BALLN SPACEMKR + OVL (BALLOONS) ×1 IMPLANT
DRAPE INCISE IOBAN 66X45 STRL (DRAPES) ×2 IMPLANT
DRAPE UTILITY 15X26 W/TAPE STR (DRAPE) ×4 IMPLANT
DRSG TEGADERM 4X4.75 (GAUZE/BANDAGES/DRESSINGS) IMPLANT
ELECT CAUTERY BLADE 6.4 (BLADE) ×2 IMPLANT
ELECT REM PT RETURN 9FT ADLT (ELECTROSURGICAL) ×2
ELECTRODE REM PT RTRN 9FT ADLT (ELECTROSURGICAL) ×1 IMPLANT
GAUZE SPONGE 2X2 8PLY STRL LF (GAUZE/BANDAGES/DRESSINGS) IMPLANT
GLOVE BIO SURGEON STRL SZ7.5 (GLOVE) ×6 IMPLANT
GLOVE BIOGEL PI IND STRL 7.5 (GLOVE) ×2 IMPLANT
GLOVE BIOGEL PI INDICATOR 7.5 (GLOVE) ×2
GLOVE SURG SS PI 7.5 STRL IVOR (GLOVE) ×4 IMPLANT
GOWN PREVENTION PLUS XLARGE (GOWN DISPOSABLE) ×4 IMPLANT
GOWN STRL NON-REIN LRG LVL3 (GOWN DISPOSABLE) ×2 IMPLANT
KIT BASIN OR (CUSTOM PROCEDURE TRAY) ×2 IMPLANT
KIT ROOM TURNOVER OR (KITS) ×2 IMPLANT
MARKER SKIN DUAL TIP RULER LAB (MISCELLANEOUS) ×2 IMPLANT
MESH ULTRAPRO 6X6 15CM15CM (Mesh General) ×2 IMPLANT
NS IRRIG 1000ML POUR BTL (IV SOLUTION) ×2 IMPLANT
PAD ARMBOARD 7.5X6 YLW CONV (MISCELLANEOUS) ×4 IMPLANT
PENCIL BUTTON HOLSTER BLD 10FT (ELECTRODE) ×2 IMPLANT
SCISSORS LAP 5X35 DISP (ENDOMECHANICALS) IMPLANT
SET IRRIG TUBING LAPAROSCOPIC (IRRIGATION / IRRIGATOR) ×2 IMPLANT
SPONGE GAUZE 2X2 STER 10/PKG (GAUZE/BANDAGES/DRESSINGS)
SUT MNCRL AB 4-0 PS2 18 (SUTURE) ×2 IMPLANT
TIP RIGID 35CM EVICEL (HEMOSTASIS) ×2 IMPLANT
TOWEL OR 17X24 6PK STRL BLUE (TOWEL DISPOSABLE) ×2 IMPLANT
TOWEL OR 17X26 10 PK STRL BLUE (TOWEL DISPOSABLE) ×2 IMPLANT
TRAY FOLEY CATH 14FR (SET/KITS/TRAYS/PACK) ×2 IMPLANT
TRAY LAPAROSCOPIC (CUSTOM PROCEDURE TRAY) ×2 IMPLANT
TROCAR XCEL BLADELESS 5X75MML (TROCAR) ×4 IMPLANT
TROCAR XCEL BLUNT TIP 100MML (ENDOMECHANICALS) IMPLANT
WATER STERILE IRR 1000ML POUR (IV SOLUTION) IMPLANT

## 2011-08-11 NOTE — Brief Op Note (Signed)
08/11/2011  12:10 PM  PATIENT:  Derek Cox.  66 y.o. male  PRE-OPERATIVE DIAGNOSIS:  right inguinal hernia   POST-OPERATIVE DIAGNOSIS:  right inguinal hernia   PROCEDURE:  Procedure(s) (LRB): LAPAROSCOPIC INGUINAL HERNIA (Right) INSERTION OF MESH (Right)  SURGEON:  Surgeon(s) and Role:    * Lodema Pilot, DO - Primary  PHYSICIAN ASSISTANT:   ASSISTANTS: none   ANESTHESIA:   general  EBL:  Total I/O In: 1000 [I.V.:1000] Out: -   BLOOD ADMINISTERED:none  DRAINS: none   LOCAL MEDICATIONS USED:  MARCAINE    and LIDOCAINE   SPECIMEN:  No Specimen  DISPOSITION OF SPECIMEN:  N/A  COUNTS:  YES  TOURNIQUET:  * No tourniquets in log *  DICTATION: .Other Dictation: Dictation Number [id number]  PLAN OF CARE: Discharge to home after PACU  PATIENT DISPOSITION:  PACU - hemodynamically stable.   Delay start of Pharmacological VTE agent (>24hrs) due to surgical blood loss or risk of bleeding: no

## 2011-08-11 NOTE — Anesthesia Postprocedure Evaluation (Signed)
  Anesthesia Post-op Note  Patient: Derek Cox.  Procedure(s) Performed: Procedure(s) (LRB): LAPAROSCOPIC INGUINAL HERNIA (Right) INSERTION OF MESH (Right)  Patient Location: PACU  Anesthesia Type: General  Level of Consciousness: awake and alert   Airway and Oxygen Therapy: Patient Spontanous Breathing  Post-op Pain: mild  Post-op Assessment: Post-op Vital signs reviewed, Patient's Cardiovascular Status Stable, Respiratory Function Stable, Patent Airway and No signs of Nausea or vomiting  Post-op Vital Signs: stable  Complications: No apparent anesthesia complications

## 2011-08-11 NOTE — Op Note (Signed)
NAME:  Derek Cox, Derek Cox NO.:  [id number]  MEDICAL RECORD NO.:  [id number]  LOCATION:  [facility]                         FACILITY:  [facility]  PHYSICIAN:  Lodema Pilot, MD       DATE OF BIRTH:  24-Jul-1945  DATE OF PROCEDURE:  08/11/2011 DATE OF DISCHARGE:  08/11/2011                              OPERATIVE REPORT   PROCEDURE:  Laparoscopic repair of right inguinal hernia with mesh.  PREOPERATIVE DIAGNOSIS:  Right inguinal hernia.  POSTOPERATIVE DIAGNOSIS:  Right inguinal hernia.  SURGEON:  Lodema Pilot, MD  ASSISTANT:  None.  ANESTHESIA:  General endotracheal tube anesthesia with 40 mL of 1% lidocaine with epinephrine and 0.25% Marcaine in a 50:50 mixture.  FLUIDS:  Please see anesthesia record.  ESTIMATED BLOOD LOSS:  Minimal.  DRAINS:  None.  SPECIMENS:  None.  COMPLICATIONS:  None apparent.  FINDINGS:  Cord lipoma laterally with moderate-sized direct inguinal hernia as well as indirect hernia repaired with a 6 inch x 6 inch UltraPro mesh.  COMPLICATIONS:  None apparent.  INDICATIONS FOR PROCEDURE:  Derek Cox is a 66 year old male with prior open left inguinal hernia repair and recently developed a bulge in his right groin, this is mildly symptomatic, but does cause some nagging discomfort in any of the reducible right inguinal hernia on exam.  OPERATIVE DETAILS:  Derek Cox was seen and evaluated in the preoperative area and risks and benefits of the procedure were again discussed in lay terms.  Informed consent was obtained.  Surgical site was marked prior to anesthetic administration.  Prophylactic antibiotics were given and he was taken to the operating room, placed on table in supine position, and general endotracheal tube anesthesia was obtained. A Foley catheter was placed and his left arm was tucked.  His abdomen was prepped and draped in the standard surgical fashion.  An infraumbilical incision was made in the skin and dissection carried  down to the abdominal wall fascia.  The fascia was sharply incised and the rectus muscle was identified.  The muscle was retracted anteriorly and this preperitoneal space was developed bluntly.  The Spacemaker balloon dissector was placed in this preperitoneal space and placed to the pubic tubercle and insufflated with about 27 pumps to the balloon.  Balloon was left in place for hemostasis and for dissection and the balloon was removed and the preperitoneal space was insufflated with 12 mmHg carbon dioxide gas.  The 2.5 mm midline trocars were placed under direct visualization taking care to avoid injury to the bladder and then the preperitoneal space was dissected starting laterally and dissected out to the anterior superior iliac spine.  This was carried medially to the cord structures.  Medial dissection was performed at the pubic tubercle, down Cooper's ligament.  At this point, he was noted to have a moderate- sized direct hernia defect.  The hernia sac was taken down and laid down posteriorly and Cooper's ligament was cleared approaching the iliac vessels and the spermatic cord was skeletonized and inspected for indirect hernia.  He had a cord lipoma laterally which was taken down, and he also had an indirect hernia sac which was bluntly dissected away from the cord  structures.  The hernia sac was taken down posteriorly towards the vas deferens trans medially and after both hernia sacs in the direct and indirect space were dissected.  A 6 inch x 6 inch UltraPro mesh was tailored to fit the space and placed in the preperitoneal space, was tacked medially to the pubic tubercle, and anteriorly in the midline.  It was stretched laterally and passed the spermatic cord structures and was tacked to the anterior abdominal wall with absorbable tack as well.  The mesh appeared to cover the indirect and direct hernia spaces. I placed a few additional tacks down Cooper's ligament since he had  a direct hernia, and the area of the hernia defect.  A few additional tacks were placed on the anterior abdominal wall as well, and exited the epigastric vessels and the mesh was laid flat over the floor and Evicel fibrin glue was used to secure the mesh posteriorly.  The hernia sac was identified and a cord lipoma.  These were noted to be posterior to the mesh and the preperitoneal space was partially entered.  There was a small rent in the peritoneum, dissecting the indirect hernia sac and the space was closed with single hemoclip. Then the preperitoneum was let down under direct visualization, and the preperitoneal space was filled with 20 mL of 1% lidocaine with epinephrine and 0.25% Marcaine in a 50:50 mixture.  The fascial incision was at the umbilicus was approximated with interrupted 0 Vicryl sutures in open fashion.  The wounds were injected with another 20 mL of 1% lidocaine with epinephrine and 0.25% Marcaine in a 50:50 mixture and the skin edges were approximated with 4-0 Monocryl subcuticular suture. Skin was washed and dried and Dermabond was applied.  All sponge, needle, and instrument counts were correct at the end of the case.  The patient tolerated the procedure well without apparent complication. After the drapes were let down, I noticed that he did have some pneumoperitoneum from the carbon dioxide gas and likely from the hole in the peritoneum, but this was not extensive and at this point, the drapes already been taken down and did not feel the need to reopen the incision to let out the carbon dioxide gas.  He was hemodynamically stable and ready for transfer to recovery room in stable condition.          ______________________________ Lodema Pilot, MD     BL/MEDQ  D:  08/11/2011  T:  08/11/2011  Job:  [id number]

## 2011-08-11 NOTE — H&P (Signed)
HPI This patient is referred by Dr. Valentina Lucks for evaluation of a newly diagnosed right inguinal hernia. He first noticed this bulge approximately one month ago and noticed that it was around a golf ball size initially. Over the last few days he says that this has decreased in size but he does have some discomfort with palpation. Otherwise, he states that he has minimal discomfort and describes this as a "nagging" he denies any obstructive symptoms such as abdominal pain, nausea, vomiting, or obstipation. He does state that he had a prior hernia repair approximately 20 years ago and he thinks that mesh was included but he does not remember which side and the particular details.  Past Medical History   Diagnosis  Date   .  Hypertension    .  Hyperlipidemia    .  Seizures    .  Stroke     Past Surgical History   Procedure  Date   .  Pilonidal cyst excision    .  Knee surgery    .  Tonsillectomy    .  Mole removal      non melanoma   .  Ich evacuation  2010   .  Cervical fusion  2007    History reviewed. No pertinent family history.  Social History  History   Substance Use Topics   .  Smoking status:  Former Smoker -- 0.5 packs/day for 20 years     Types:  Cigarettes   .  Smokeless tobacco:  Former Neurosurgeon     Quit date:  07/07/1998   .  Alcohol Use:  0.0 oz/week     1-2 Cans of beer per week      per week    Allergies   Allergen  Reactions   .  Oxycodone  Itching    Current Outpatient Prescriptions   Medication  Sig  Dispense  Refill   .  amLODipine (NORVASC) 10 MG tablet  Take 10 mg by mouth daily.     Marland Kitchen  aspirin 81 MG tablet  Take 81 mg by mouth daily.     Marland Kitchen  levETIRAcetam (KEPPRA) 500 MG tablet  Take 500 mg by mouth every 12 (twelve) hours.     Marland Kitchen  lisinopril (PRINIVIL,ZESTRIL) 10 MG tablet  Take 10 mg by mouth daily.     .  simvastatin (ZOCOR) 20 MG tablet  Take 20 mg by mouth every evening.     .  zolpidem (AMBIEN) 5 MG tablet  Take 5 mg by mouth at bedtime as needed.       Review of Systems  Review of Systems  All other review of systems negative or noncontributory except as stated in the HPI   Physical Exam  Physical Exam  Physical Exam  Vitals reviewed.  Constitutional: He is oriented to person, place, and time. He appears well-developed and well-nourished. No distress.  HENT:  Head: Normocephalic and atraumatic.  Mouth/Throat: No oropharyngeal exudate.  Eyes: Conjunctivae and EOM are normal. Pupils are equal, round, and reactive to light. Right eye exhibits no discharge. Left eye exhibits no discharge. No scleral icterus.  Neck: Normal range of motion. No tracheal deviation present.  Cardiovascular: Normal rate, regular rhythm and normal heart sounds.  Pulmonary/Chest: Effort normal and breath sounds normal. No stridor. No respiratory distress. He has no wheezes. He has no rales. He exhibits no tenderness.  Abdominal: Soft. Bowel sounds are normal. He exhibits no distension and no mass. There is no tenderness. There  is no rebound and no guarding. Reducible RIH but tender with reduction. No LIH or skin changes. I do not see a scar from any prior procedures. Musculoskeletal: Normal range of motion. He exhibits no edema and no tenderness.  Neurological: He is alert and oriented to person, place, and time.  Skin: Skin is warm and dry. No rash noted. He is not diaphoretic. No erythema. No pallor.  Psychiatric: He has a normal mood and affect. His behavior is normal. Judgment and thought content normal.  Data Reviewed  Assessment   Reducible right inguinal hernia  I do not see any evidence of strangulation or incarceration. The hernia is reducible on exam today although he does have some tenderness with this. I discussed with him the options for watchful waiting versus laparoscopic or open repair and he would like to proceed with surgical repair of his right inguinal hernia. Given the fact that he has had a prior open inguinal hernia repair with mesh we have  decided to proceed with laparoscopic repair although I'm not sure which side this repair was on it I could not see an obvious scar today in the clinic. I discussed with him the risks of the procedure including infection, bleeding, pain, scarring, recurrence, nerve injury, injury to testicle or vas deferens and chronic pain and expressed understanding and would like to proceed with laparoscopic right inguinal hernia repair with mesh. He denies any changes from prior history and exam.  We again discussed the above risks and he expressed understanding and desires to proceed with lap RIH with mesh.  Site marked with the patient prior to anesthesia.

## 2011-08-11 NOTE — Progress Notes (Signed)
Pt. Unable to void more than a few drops of urine.  Dr. Lindie Spruce notified and order for Straight cath. Given. In and Out Cath done with 750 clear urine obtained.  Pt. States he feels much better.  Dr. Lindie Spruce ok with pt. Going home after cath.  Instructed pt. To contact MD should trouble urine continue and made sure wife had number to call.  Wife and pt. Feels comfortable going home at this time.

## 2011-08-11 NOTE — Anesthesia Procedure Notes (Signed)
Procedure Name: Intubation Date/Time: 08/11/2011 10:11 AM Performed by: Maris Berger T Pre-anesthesia Checklist: Patient identified, Emergency Drugs available, Suction available and Patient being monitored Patient Re-evaluated:Patient Re-evaluated prior to inductionOxygen Delivery Method: Circle System Utilized Preoxygenation: Pre-oxygenation with 100% oxygen Intubation Type: IV induction Ventilation: Mask ventilation without difficulty and Oral airway inserted - appropriate to patient size Laryngoscope Size: Mac and 3 Grade View: Grade I Tube type: Oral Tube size: 7.5 mm Number of attempts: 1 Airway Equipment and Method: stylet and oral airway Placement Confirmation: ETT inserted through vocal cords under direct vision,  positive ETCO2 and breath sounds checked- equal and bilateral Secured at: 21 cm Tube secured with: Tape Dental Injury: Teeth and Oropharynx as per pre-operative assessment

## 2011-08-11 NOTE — Transfer of Care (Signed)
Immediate Anesthesia Transfer of Care Note  Patient: Derek Cox.  Procedure(s) Performed: Procedure(s) (LRB): LAPAROSCOPIC INGUINAL HERNIA (Right) INSERTION OF MESH (Right)  Patient Location: PACU  Anesthesia Type: General  Level of Consciousness: alert  and oriented  Airway & Oxygen Therapy: Patient Spontanous Breathing and Patient connected to face mask oxygen  Post-op Assessment: Report given to PACU RN  Post vital signs: Reviewed and stable  Complications: No apparent anesthesia complications

## 2011-08-11 NOTE — Telephone Encounter (Signed)
The patient's wife called. Patient apparently had difficulty voiding in recovery room. Had I of "a wine bottles worth of urine". Was sent home.  Now 6-1/2 hours later, patient is a very uncomfortable and cannot urinate. I recommended that they've have a catheter placed. I called the charge nurse at the [facility] long emergency department. They are anticipating the patient coming in for a Foley leg bag. I would assume that we would keep the catheter in over the weekend and then remove it on Monday, May 20

## 2011-08-11 NOTE — Anesthesia Preprocedure Evaluation (Addendum)
Anesthesia Evaluation  Patient identified by MRN, date of birth, ID band Patient awake    Reviewed: Allergy & Precautions, H&P , NPO status , Patient's Chart, lab work & pertinent test results  Airway Mallampati: II TM Distance: >3 FB Neck ROM: Full    Dental No notable dental hx.    Pulmonary sleep apnea , former smoker breath sounds clear to auscultation  Pulmonary exam normal       Cardiovascular hypertension, Pt. on medications Rhythm:Regular Rate:Normal     Neuro/Psych Seizures -, Well Controlled,  CVA, No Residual Symptoms negative psych ROS   GI/Hepatic negative GI ROS, Neg liver ROS,   Endo/Other  negative endocrine ROS  Renal/GU negative Renal ROS  negative genitourinary   Musculoskeletal negative musculoskeletal ROS (+)   Abdominal   Peds negative pediatric ROS (+)  Hematology negative hematology ROS (+)   Anesthesia Other Findings   Reproductive/Obstetrics negative OB ROS                           Anesthesia Physical Anesthesia Plan  ASA: III  Anesthesia Plan: General   Post-op Pain Management:    Induction: Intravenous  Airway Management Planned: Oral ETT  Additional Equipment:   Intra-op Plan:   Post-operative Plan: Extubation in OR  Informed Consent: I have reviewed the patients History and Physical, chart, labs and discussed the procedure including the risks, benefits and alternatives for the proposed anesthesia with the patient or authorized representative who has indicated his/her understanding and acceptance.   Dental advisory given  Plan Discussed with: CRNA  Anesthesia Plan Comments:         Anesthesia Quick Evaluation

## 2011-08-12 ENCOUNTER — Encounter (HOSPITAL_COMMUNITY): Payer: Self-pay | Admitting: Emergency Medicine

## 2011-08-12 ENCOUNTER — Emergency Department (HOSPITAL_COMMUNITY)
Admission: EM | Admit: 2011-08-12 | Discharge: 2011-08-12 | Disposition: A | Discharge status: Home / Self Care | Payer: Medicare Other | Attending: Emergency Medicine | Admitting: Emergency Medicine

## 2011-08-12 DIAGNOSIS — Z79899 Other long term (current) drug therapy: Secondary | ICD-10-CM | POA: Insufficient documentation

## 2011-08-12 DIAGNOSIS — I10 Essential (primary) hypertension: Secondary | ICD-10-CM | POA: Insufficient documentation

## 2011-08-12 DIAGNOSIS — R339 Retention of urine, unspecified: Secondary | ICD-10-CM | POA: Diagnosis not present

## 2011-08-12 DIAGNOSIS — Z8673 Personal history of transient ischemic attack (TIA), and cerebral infarction without residual deficits: Secondary | ICD-10-CM | POA: Diagnosis not present

## 2011-08-12 DIAGNOSIS — E785 Hyperlipidemia, unspecified: Secondary | ICD-10-CM | POA: Diagnosis not present

## 2011-08-12 DIAGNOSIS — G4730 Sleep apnea, unspecified: Secondary | ICD-10-CM | POA: Insufficient documentation

## 2011-08-12 LAB — POCT I-STAT, CHEM 8
Calcium, Ion: 1.12 mmol/L (ref 1.12–1.32)
Chloride: 101 mEq/L (ref 96–112)
Glucose, Bld: 171 mg/dL — ABNORMAL HIGH (ref 70–99)
HCT: 39 % (ref 39.0–52.0)
TCO2: 24 mmol/L (ref 0–100)

## 2011-08-12 MED ORDER — TAMSULOSIN HCL 0.4 MG PO CAPS: 0.4000 mg | ORAL_CAPSULE | Freq: Every day | ORAL | Status: DC

## 2011-08-12 NOTE — Discharge Instructions (Signed)
Acute Urinary Retention, Male You have been seen by a caregiver today because of your inability to urinate (pass your water). This is a common problem in elderly males. As men age their prostates become larger and block the flow of urine from the bladder. This is usually a problem that has come on gradually. It is often first noticed by having to get up at night to urinate. This is because as the prostate enlarges it is more difficult to empty the bladder completely. Treatment may involve a one time catheterization to empty the bladder. This is putting in a tube to drain your urine. Then you and your personal caregiver can decide at your earliest convenience how to handle this problem in the future. It may also be a problem that may not recur for years. Sometimes this problem can be caused by medications. In this case, all that is often necessary is to discontinue the offending agent. If you are to leave the foley catheter (a long, narrow, hollow tube) in and go home with a drainage system, you will need to discuss the best course of action with your caregiver. While the catheter is in, maintain a good intake of fluids. Keep the drainage bag emptied and lower than your catheter. This is so contaminated (infected) urine will not be flowing back into your bladder. This could lead to a urinary tract infection. Only take over-the-counter or prescription medicines for pain, discomfort, or fever as directed by your caregiver.  SEEK IMMEDIATE MEDICAL CARE IF:  You develop chills, fever, or show signs of generalized illness that occurs prior to seeing your caregiver. Document Released: 06/20/2000 Document Revised: 03/03/2011 Document Reviewed: 03/05/2008 ExitCare Patient Information 2012 [employer].  RESOURCE GUIDE  Dental Problems  Patients with Medicaid: [facility]                     [facility] [address]                                           [address] Phone:  [phone]                                                   Phone:  [phone]  If unable to pay or uninsured, contact:  [facility] or [facility] to become qualified for the adult dental clinic.  Chronic Pain Problems Contact [facility]  [phone] Patients need to be referred by their primary care doctor.  Insufficient Money for Medicine Contact [employer]:  call "211" or [facility] 271-5999.  No Primary Care Doctor Call [facility]  832-8000 Other agencies that provide inexpensive medical care    [facility]  [phone]    [facility]  [phone]    [facility]  [phone]    [facility] Clinic  [phone]    [facility]  [phone]    [facility]  [phone]  Psychological Services [facility]  [phone] [facility]  [phone] [facility]   800 853-5163 (emergency services 641-4993)  Abuse/Neglect [place] Child Abuse Hotline (336) 641-3795 [place] Child Abuse Hotline 800-378-5315 (After   Hours)  Emergency Shelter [facility] (336) 271-5985  [facility] (336) 275-9566 [facility] (704) 372-4663  MRSA Hotline #:   832-7006    [place] [facility]  [facility] [place]  [employer]                           [facility] [address] [place]                     [address]         [address]  [place]                                               [place]                              [place] Phone:  [phone]                                  Phone:  [phone]                   Phone:  [phone]  [facility] Phone:  [phone]  [place] Child Abuse Hotline (336) 342-1394 (336) 342-3537 (After Hours)  

## 2011-08-12 NOTE — ED Notes (Signed)
PT. REPORTS URINARY RETENTION , RIGHT INGUINAL HERNIA SURGERY YESTERDAY MORNING DY DR. Emilee Hero , LAST URINARY CATHETERIZATION 5 PM YESTERDAY AFTERNOON .

## 2011-08-12 NOTE — ED Provider Notes (Addendum)
History     CSN: [id number]  Arrival date & time 08/12/11  0001   First MD Initiated Contact with Patient 08/12/11 0041      Chief Complaint  Patient presents with  . Urinary Retention    (Consider location/radiation/quality/duration/timing/severity/associated sxs/prior treatment) HPI  65yoM pw urinary retention. Pt underwent general anesthesia yesterday for R groin hernia repair. States that he experienced urinary retention immediately after surgery requiring cath with > 700cc of urine obtained. Only urinated small drops after that time. Has not urinated since 530PM yesterday. C/O mounting lower abdominal discomfort, relieved after foley placement in ED. Denies N/V/f/c. H/O similar after anesthesia in the past. Denies h/o prostate enlargement.   ED Notes, ED Provider Notes from 08/12/11 0000 to 08/12/11 00:08:28       Nada Libman, RN 08/12/2011 00:06      PT. REPORTS URINARY RETENTION , RIGHT INGUINAL HERNIA SURGERY YESTERDAY MORNING DY DR. Emilee Hero , LAST URINARY CATHETERIZATION 5 PM YESTERDAY AFTERNOON      Past Medical History  Diagnosis Date  . Hyperlipidemia   . Hypertension     echo done - 10/10  . 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 Seizures     after stroke , upon self d/c'ing Dilantin, since on Keppra, no repeat of seizure   . Stroke     2010, treated at [facility] for evacuation of ICH, then rehabed at [facility], no residual deficits   . Inguinal hernia     Past Surgical History  Procedure Date  . Pilonidal cyst excision   . Knee surgery   . Tonsillectomy   . Mole removal     non melanoma  . Ich evacuation 2010  . Cervical fusion 2007  . Hernia repair     inguinal hernia - repair, [facility]- 20 yrs ago    . Inguinal hernia repair     Family History  Problem Relation Age of Onset  . Anesthesia problems Neg Hx     History  Substance Use Topics  . Smoking status: Former Smoker -- 0.5 packs/day for 20 years    Types: Cigarettes      Quit date: 08/08/1996  . Smokeless tobacco: Former Neurosurgeon    Quit date: 07/07/1998  . Alcohol Use: 0.0 oz/week    6-7 Glasses of wine, 1-2 Cans of beer per week     one glass of wine (or +) every night       Review of Systems  All other systems reviewed and are negative.   except as noted HPI   Allergies  Dilantin and Oxycodone  Home Medications   Current Outpatient Rx  Name Route Sig Dispense Refill  . AMLODIPINE BESYLATE 10 MG PO TABS Oral Take 10 mg by mouth every morning.     . ASPIRIN 81 MG PO TABS Oral Take 81 mg by mouth daily.    Marland Kitchen VITAMIN D3 2000 UNITS PO TABS Oral Take 2,000 Units by mouth daily.    Marland Kitchen LEVETIRACETAM 500 MG PO TABS Oral Take 500 mg by mouth every 12 (twelve) hours.    Marland Kitchen LISINOPRIL 10 MG PO TABS Oral Take 10 mg by mouth daily.    Marland Kitchen OVER THE COUNTER MEDICATION Oral Take 1 capsule by mouth daily. Mega Red    . OXYCODONE-ACETAMINOPHEN 7.5-325 MG PO TABS Oral Take 1 tablet by mouth every 6 (six) hours as needed for pain. 40 tablet 0  . SIMVASTATIN 20  MG PO TABS Oral Take 20 mg by mouth every morning.     Marland Kitchen TAMSULOSIN HCL 0.4 MG PO CAPS Oral Take 1 capsule (0.4 mg total) by mouth daily. 10 capsule 0    BP 149/86  Pulse 110  Temp(Src) 98 F (36.7 C) (Oral)  Resp 22  SpO2 94%  Physical Exam  Nursing note and vitals reviewed. Constitutional: He is oriented to person, place, and time. He appears well-developed and well-nourished. No distress.  HENT:  Head: Atraumatic.  Mouth/Throat: Oropharynx is clear and moist.  Eyes: Conjunctivae are normal. Pupils are equal, round, and reactive to light.  Neck: Neck supple.  Cardiovascular: Normal rate, regular rhythm, normal heart sounds and intact distal pulses.  Exam reveals no gallop and no friction rub.   No murmur heard. Pulmonary/Chest: Effort normal. No respiratory distress. He has no wheezes. He has no rales.  Abdominal: Soft. Bowel sounds are normal. There is tenderness. There is no rebound and no  guarding.       Min lower abd ttp Surgical wounds c/d/i  Genitourinary:       Foley in place 700cc clear urine in bag  Musculoskeletal: Normal range of motion. He exhibits no edema and no tenderness.  Neurological: He is alert and oriented to person, place, and time.  Skin: Skin is warm and dry.  Psychiatric: He has a normal mood and affect.    ED Course  Procedures (including critical care time)  Labs Reviewed  POCT I-STAT, CHEM 8 - Abnormal; Notable for the following:    Glucose, Bld 171 (*)    All other components within normal limits   No results found.   1. Urinary retention      MDM  Urinary retention after anesthesia. Improved with foley placement. Cr approx baseline compared to 5/14. I do not suspect UTI. No h/o BPH but will send home with foley/leg bag, flomax, and urology follow up for voiding trial. Given precautions for return.   HR improved to 90 after foley placement.       Forbes Cellar, MD 08/12/11 2956  Forbes Cellar, MD 08/12/11 2130

## 2011-08-12 NOTE — ED Notes (Signed)
14 French Foley Catheter inserted using sterile technique by J. Kerryann Allaire, EMT. j immediate return of urine and still running.

## 2011-08-12 NOTE — ED Notes (Signed)
NO IV OR URINARY CATHETER AT ARRIVAL.

## 2011-08-12 NOTE — ED Notes (Signed)
Leg bag applied and instructions given to pt and wife.

## 2011-08-15 DIAGNOSIS — R339 Retention of urine, unspecified: Secondary | ICD-10-CM | POA: Diagnosis not present

## 2011-08-26 ENCOUNTER — Telehealth (INDEPENDENT_AMBULATORY_CARE_PROVIDER_SITE_OTHER): Payer: Self-pay

## 2011-08-26 NOTE — Telephone Encounter (Signed)
Called patient - no answer left voice mail to call our office to discuss referral to [facility] for Urinary Retention.

## 2011-09-02 ENCOUNTER — Telehealth (INDEPENDENT_AMBULATORY_CARE_PROVIDER_SITE_OTHER): Payer: Self-pay

## 2011-09-02 NOTE — Telephone Encounter (Signed)
Called patient for reminder follow up appointment on 09/09/11 w/Dr. Biagio Quint.  No answer, left voicemail to call our office.

## 2011-09-05 ENCOUNTER — Telehealth (INDEPENDENT_AMBULATORY_CARE_PROVIDER_SITE_OTHER): Payer: Self-pay

## 2011-09-05 NOTE — Telephone Encounter (Signed)
Called patient-- no answer, left message on voicemail to call our office and ask for Derek Cox.

## 2011-09-09 ENCOUNTER — Encounter (INDEPENDENT_AMBULATORY_CARE_PROVIDER_SITE_OTHER): Payer: Self-pay | Admitting: General Surgery

## 2011-09-09 ENCOUNTER — Ambulatory Visit (INDEPENDENT_AMBULATORY_CARE_PROVIDER_SITE_OTHER): Payer: Medicare Other | Admitting: General Surgery

## 2011-09-09 VITALS — BP 124/76 | HR 71 | Temp 97.8°F | Resp 12 | Ht 70.0 in | Wt 171.4 lb

## 2011-09-09 DIAGNOSIS — Z5189 Encounter for other specified aftercare: Secondary | ICD-10-CM

## 2011-09-09 DIAGNOSIS — Z4889 Encounter for other specified surgical aftercare: Secondary | ICD-10-CM

## 2011-09-09 NOTE — Progress Notes (Signed)
Subjective:     Patient ID: Derek Conger., male   DOB: [birth year], 66 y.o.   MRN: [id number]  HPI This patient follows up in approximately one month status post laparoscopic right inguinal hernia repair with mesh. He had some postoperative urinary retention and has followed up with his urologist for this and this has since resolved. He is doing very well and has no residual bulge or discomfort in the groin. He has no complaints. His bowels are functioning normal his appetite is normal. He is returning to regular activities.  Review of Systems     Objective:   Physical Exam No distress and nontoxic-appearing His incisions are well-healed without signs of infection. He is no evidence of recurrent hernia and no bulge with Valsalva. He does have a small umbilical hernia which is not appreciated previously but there is no tenderness on exam and this is easily reducible.    Assessment:     Status post laparoscopic right inguinal hernia repair with mesh-doing well Small umbilical hernia-reducible  He is doing very well from this procedure. There is no evidence of recurrence or postoperative complications. His urinary retention has improved. He does have a small reducible umbilical hernia which is asymptomatic and his reducible and is not interested in any repair at this time.    Plan:     He can increase his activity as tolerated and he can followup with me in appearance basis. If he is interested in repair as a local hernia than I would be happy to address this issue.

## 2011-11-25 DIAGNOSIS — Z1331 Encounter for screening for depression: Secondary | ICD-10-CM | POA: Diagnosis not present

## 2011-11-25 DIAGNOSIS — R7301 Impaired fasting glucose: Secondary | ICD-10-CM | POA: Diagnosis not present

## 2011-11-25 DIAGNOSIS — R569 Unspecified convulsions: Secondary | ICD-10-CM | POA: Diagnosis not present

## 2011-11-25 DIAGNOSIS — G479 Sleep disorder, unspecified: Secondary | ICD-10-CM | POA: Diagnosis not present

## 2011-11-25 DIAGNOSIS — E7800 Pure hypercholesterolemia, unspecified: Secondary | ICD-10-CM | POA: Diagnosis not present

## 2011-11-25 DIAGNOSIS — I10 Essential (primary) hypertension: Secondary | ICD-10-CM | POA: Diagnosis not present

## 2011-11-25 DIAGNOSIS — M5412 Radiculopathy, cervical region: Secondary | ICD-10-CM | POA: Diagnosis not present

## 2011-11-25 DIAGNOSIS — Z0000 Encounter for general adult medical examination without abnormal findings: Secondary | ICD-10-CM | POA: Diagnosis not present

## 2012-03-05 DIAGNOSIS — L821 Other seborrheic keratosis: Secondary | ICD-10-CM | POA: Diagnosis not present

## 2012-03-05 DIAGNOSIS — Z85828 Personal history of other malignant neoplasm of skin: Secondary | ICD-10-CM | POA: Diagnosis not present

## 2012-03-05 DIAGNOSIS — L719 Rosacea, unspecified: Secondary | ICD-10-CM | POA: Diagnosis not present

## 2012-03-05 DIAGNOSIS — D239 Other benign neoplasm of skin, unspecified: Secondary | ICD-10-CM | POA: Diagnosis not present

## 2012-03-05 DIAGNOSIS — L570 Actinic keratosis: Secondary | ICD-10-CM | POA: Diagnosis not present

## 2012-05-28 DIAGNOSIS — G479 Sleep disorder, unspecified: Secondary | ICD-10-CM | POA: Diagnosis not present

## 2012-05-28 DIAGNOSIS — E7800 Pure hypercholesterolemia, unspecified: Secondary | ICD-10-CM | POA: Diagnosis not present

## 2012-05-28 DIAGNOSIS — I10 Essential (primary) hypertension: Secondary | ICD-10-CM | POA: Diagnosis not present

## 2012-05-28 DIAGNOSIS — R7301 Impaired fasting glucose: Secondary | ICD-10-CM | POA: Diagnosis not present

## 2012-05-29 DIAGNOSIS — M5412 Radiculopathy, cervical region: Secondary | ICD-10-CM | POA: Diagnosis not present

## 2012-05-29 DIAGNOSIS — M542 Cervicalgia: Secondary | ICD-10-CM | POA: Diagnosis not present

## 2012-06-05 ENCOUNTER — Other Ambulatory Visit: Payer: Self-pay | Admitting: Neurosurgery

## 2012-06-05 DIAGNOSIS — M542 Cervicalgia: Secondary | ICD-10-CM

## 2012-06-10 ENCOUNTER — Ambulatory Visit
Admission: RE | Admit: 2012-06-10 | Discharge: 2012-06-10 | Disposition: A | Discharge status: Home / Self Care | Payer: Medicare Other | Source: Ambulatory Visit | Attending: Neurosurgery | Admitting: Neurosurgery

## 2012-06-10 DIAGNOSIS — M5020 Other cervical disc displacement, unspecified cervical region: Secondary | ICD-10-CM | POA: Diagnosis not present

## 2012-06-10 DIAGNOSIS — M4310 Spondylolisthesis, site unspecified: Secondary | ICD-10-CM | POA: Diagnosis not present

## 2012-06-10 DIAGNOSIS — M542 Cervicalgia: Secondary | ICD-10-CM

## 2012-06-10 DIAGNOSIS — M4802 Spinal stenosis, cervical region: Secondary | ICD-10-CM | POA: Diagnosis not present

## 2012-06-10 DIAGNOSIS — M47812 Spondylosis without myelopathy or radiculopathy, cervical region: Secondary | ICD-10-CM | POA: Diagnosis not present

## 2012-06-27 DIAGNOSIS — M542 Cervicalgia: Secondary | ICD-10-CM | POA: Diagnosis not present

## 2012-07-12 DIAGNOSIS — M5412 Radiculopathy, cervical region: Secondary | ICD-10-CM | POA: Diagnosis not present

## 2012-07-12 DIAGNOSIS — M542 Cervicalgia: Secondary | ICD-10-CM | POA: Diagnosis not present

## 2012-08-08 ENCOUNTER — Other Ambulatory Visit: Payer: Self-pay | Admitting: Neurosurgery

## 2012-08-08 DIAGNOSIS — M542 Cervicalgia: Secondary | ICD-10-CM

## 2012-08-10 ENCOUNTER — Ambulatory Visit
Admission: RE | Admit: 2012-08-10 | Discharge: 2012-08-10 | Disposition: A | Discharge status: Home / Self Care | Payer: Medicare Other | Source: Ambulatory Visit | Attending: Neurosurgery | Admitting: Neurosurgery

## 2012-08-10 VITALS — BP 155/92 | HR 65

## 2012-08-10 DIAGNOSIS — M47812 Spondylosis without myelopathy or radiculopathy, cervical region: Secondary | ICD-10-CM | POA: Diagnosis not present

## 2012-08-10 DIAGNOSIS — M5020 Other cervical disc displacement, unspecified cervical region: Secondary | ICD-10-CM | POA: Diagnosis not present

## 2012-08-10 DIAGNOSIS — M542 Cervicalgia: Secondary | ICD-10-CM

## 2012-08-10 MED ORDER — METHYLPREDNISOLONE ACETATE 40 MG/ML INJ SUSP (RADIOLOG
120.0000 mg | Freq: Once | INTRAMUSCULAR | Status: AC
  Administered 2012-08-10: 120 mg via EPIDURAL

## 2012-08-10 MED ORDER — IOHEXOL 180 MG/ML  SOLN
1.0000 mL | Freq: Once | INTRAMUSCULAR | Status: AC | PRN
  Administered 2012-08-10: 1 mL via EPIDURAL

## 2012-10-30 DIAGNOSIS — M5412 Radiculopathy, cervical region: Secondary | ICD-10-CM | POA: Diagnosis not present

## 2012-10-30 DIAGNOSIS — M542 Cervicalgia: Secondary | ICD-10-CM | POA: Diagnosis not present

## 2012-10-31 ENCOUNTER — Other Ambulatory Visit: Payer: Self-pay | Admitting: Neurosurgery

## 2012-11-07 ENCOUNTER — Encounter (HOSPITAL_COMMUNITY): Payer: Self-pay | Admitting: Pharmacy Technician

## 2012-11-08 ENCOUNTER — Encounter (HOSPITAL_COMMUNITY)
Admission: RE | Admit: 2012-11-08 | Discharge: 2012-11-08 | Disposition: A | Discharge status: Home / Self Care | Payer: Medicare Other | Source: Ambulatory Visit | Attending: Anesthesiology | Admitting: Anesthesiology

## 2012-11-08 ENCOUNTER — Encounter (HOSPITAL_COMMUNITY): Payer: Self-pay

## 2012-11-08 ENCOUNTER — Encounter (HOSPITAL_COMMUNITY)
Admission: RE | Admit: 2012-11-08 | Discharge: 2012-11-08 | Disposition: A | Discharge status: Home / Self Care | Payer: Medicare Other | Source: Ambulatory Visit | Attending: Neurosurgery | Admitting: Neurosurgery

## 2012-11-08 DIAGNOSIS — Z01812 Encounter for preprocedural laboratory examination: Secondary | ICD-10-CM | POA: Insufficient documentation

## 2012-11-08 DIAGNOSIS — Z01818 Encounter for other preprocedural examination: Secondary | ICD-10-CM | POA: Insufficient documentation

## 2012-11-08 DIAGNOSIS — Z01810 Encounter for preprocedural cardiovascular examination: Secondary | ICD-10-CM | POA: Diagnosis not present

## 2012-11-08 HISTORY — DX: Personal history of other specified conditions: Z87.898

## 2012-11-08 HISTORY — DX: Adverse effect of unspecified anesthetic, initial encounter: T41.45XA

## 2012-11-08 HISTORY — DX: Other complications of anesthesia, initial encounter: T88.59XA

## 2012-11-08 LAB — BASIC METABOLIC PANEL
CO2: 25 mEq/L (ref 19–32)
Calcium: 9.8 mg/dL (ref 8.4–10.5)
Creatinine, Ser: 1.07 mg/dL (ref 0.50–1.35)
GFR calc non Af Amer: 69 mL/min — ABNORMAL LOW (ref >90)
Glucose, Bld: 102 mg/dL — ABNORMAL HIGH (ref 70–99)

## 2012-11-08 LAB — CBC
MCH: 31.4 pg (ref 26.0–34.0)
MCHC: 35 g/dL (ref 30.0–36.0)
MCV: 89.6 fL (ref 78.0–100.0)
Platelets: 215 10*3/uL (ref 150–400)
RDW: 12.5 % (ref 11.5–15.5)

## 2012-11-08 LAB — SURGICAL PCR SCREEN: MRSA, PCR: NEGATIVE

## 2012-11-08 NOTE — Pre-Procedure Instructions (Signed)
Derek Cox  11/08/2012   Your procedure is scheduled on:  Thursday, August 21  Report to [facility] at 0930 AM.  Call this number if you have problems the morning of surgery: [phone]   Remember:   Do not eat food or drink liquids after midnight.Wednesday night   Take these medicines the morning of surgery with A SIP OF WATER: Keppra, Norvasc   Do not wear jewelry, make-up or nail polish.  Do not wear lotions, powders, or perfumes. You may wear deodorant.  Do not shave 48 hours prior to surgery. Men may shave face and neck.  Do not bring valuables to the hospital.  [facility] is not responsible   for any belongings or valuables.  Contacts, dentures or bridgework may not be worn into surgery.  Leave suitcase in the car. After surgery it may be brought to your room.  For patients admitted to the hospital, checkout time is 11:00 AM the day of  discharge.   Patients discharged the day of surgery will not be allowed to drive  home.  Name and phone number of your driver: Spouse  Special Instructions: Shower using CHG 2 nights before surgery and the night before surgery.  If you shower the day of surgery use CHG.  Use special wash - you have one bottle of CHG for all showers.  You should use approximately 1/3 of the bottle for each shower.   Please read over the following fact sheets that you were given: Pain Booklet, Coughing and Deep Breathing and Surgical Site Infection Prevention

## 2012-11-12 DIAGNOSIS — L821 Other seborrheic keratosis: Secondary | ICD-10-CM | POA: Diagnosis not present

## 2012-11-12 DIAGNOSIS — L570 Actinic keratosis: Secondary | ICD-10-CM | POA: Diagnosis not present

## 2012-11-14 MED ORDER — CEFAZOLIN SODIUM-DEXTROSE 2-3 GM-% IV SOLR
2.0000 g | INTRAVENOUS | Status: AC
  Administered 2012-11-15: 2 g via INTRAVENOUS
  Filled 2012-11-14: qty 50

## 2012-11-15 ENCOUNTER — Inpatient Hospital Stay (HOSPITAL_COMMUNITY)
Admission: RE | Admit: 2012-11-15 | Discharge: 2012-11-16 | DRG: 472 | Disposition: A | Discharge status: Home / Self Care | Payer: Medicare Other | Source: Ambulatory Visit | Attending: Neurosurgery | Admitting: Neurosurgery

## 2012-11-15 ENCOUNTER — Encounter (HOSPITAL_COMMUNITY): Payer: Self-pay | Admitting: *Deleted

## 2012-11-15 ENCOUNTER — Inpatient Hospital Stay (HOSPITAL_COMMUNITY): Payer: Medicare Other | Admitting: Anesthesiology

## 2012-11-15 ENCOUNTER — Inpatient Hospital Stay (HOSPITAL_COMMUNITY): Payer: Medicare Other

## 2012-11-15 ENCOUNTER — Encounter (HOSPITAL_COMMUNITY): Payer: Self-pay | Admitting: Anesthesiology

## 2012-11-15 ENCOUNTER — Encounter (HOSPITAL_COMMUNITY)
Admission: RE | Disposition: A | Discharge status: Home / Self Care | Payer: Self-pay | Source: Ambulatory Visit | Attending: Neurosurgery

## 2012-11-15 DIAGNOSIS — M5412 Radiculopathy, cervical region: Secondary | ICD-10-CM | POA: Diagnosis not present

## 2012-11-15 DIAGNOSIS — G4730 Sleep apnea, unspecified: Secondary | ICD-10-CM | POA: Diagnosis present

## 2012-11-15 DIAGNOSIS — I10 Essential (primary) hypertension: Secondary | ICD-10-CM | POA: Diagnosis present

## 2012-11-15 DIAGNOSIS — R339 Retention of urine, unspecified: Secondary | ICD-10-CM | POA: Diagnosis not present

## 2012-11-15 DIAGNOSIS — Z79899 Other long term (current) drug therapy: Secondary | ICD-10-CM

## 2012-11-15 DIAGNOSIS — Z87891 Personal history of nicotine dependence: Secondary | ICD-10-CM

## 2012-11-15 DIAGNOSIS — E785 Hyperlipidemia, unspecified: Secondary | ICD-10-CM | POA: Diagnosis present

## 2012-11-15 DIAGNOSIS — Z8673 Personal history of transient ischemic attack (TIA), and cerebral infarction without residual deficits: Secondary | ICD-10-CM | POA: Diagnosis not present

## 2012-11-15 DIAGNOSIS — M542 Cervicalgia: Secondary | ICD-10-CM | POA: Diagnosis not present

## 2012-11-15 DIAGNOSIS — Z981 Arthrodesis status: Secondary | ICD-10-CM

## 2012-11-15 DIAGNOSIS — Z7982 Long term (current) use of aspirin: Secondary | ICD-10-CM | POA: Diagnosis not present

## 2012-11-15 DIAGNOSIS — M5090 Cervical disc disorder, unspecified, unspecified cervical region: Secondary | ICD-10-CM | POA: Diagnosis not present

## 2012-11-15 DIAGNOSIS — M4712 Other spondylosis with myelopathy, cervical region: Secondary | ICD-10-CM | POA: Diagnosis not present

## 2012-11-15 DIAGNOSIS — M5020 Other cervical disc displacement, unspecified cervical region: Secondary | ICD-10-CM | POA: Diagnosis not present

## 2012-11-15 DIAGNOSIS — M4802 Spinal stenosis, cervical region: Secondary | ICD-10-CM | POA: Diagnosis not present

## 2012-11-15 DIAGNOSIS — M5000 Cervical disc disorder with myelopathy, unspecified cervical region: Secondary | ICD-10-CM | POA: Diagnosis not present

## 2012-11-15 HISTORY — PX: ANTERIOR CERVICAL DECOMP/DISCECTOMY FUSION: SHX1161

## 2012-11-15 SURGERY — ANTERIOR CERVICAL DECOMPRESSION/DISCECTOMY FUSION 1 LEVEL/HARDWARE REMOVAL
Anesthesia: General | Site: Spine Cervical | Wound class: Clean

## 2012-11-15 MED ORDER — ALUM & MAG HYDROXIDE-SIMETH 200-200-20 MG/5ML PO SUSP: 30.0000 mL | Freq: Four times a day (QID) | ORAL | Status: DC | PRN

## 2012-11-15 MED ORDER — DEXAMETHASONE 4 MG PO TABS
4.0000 mg | ORAL_TABLET | Freq: Four times a day (QID) | ORAL | Status: AC
  Administered 2012-11-15 – 2012-11-16 (×3): 4 mg via ORAL
  Filled 2012-11-15 (×3): qty 1

## 2012-11-15 MED ORDER — OXYCODONE HCL 5 MG PO TABS: 5.0000 mg | ORAL_TABLET | Freq: Once | ORAL | Status: DC | PRN

## 2012-11-15 MED ORDER — ONDANSETRON HCL 4 MG/2ML IJ SOLN
INTRAMUSCULAR | Status: DC | PRN
  Administered 2012-11-15: 4 mg via INTRAVENOUS

## 2012-11-15 MED ORDER — MIDAZOLAM HCL 2 MG/2ML IJ SOLN: 0.5000 mg | Freq: Once | INTRAMUSCULAR | Status: DC | PRN

## 2012-11-15 MED ORDER — ACETAMINOPHEN 650 MG RE SUPP: 650.0000 mg | RECTAL | Status: DC | PRN

## 2012-11-15 MED ORDER — LACTATED RINGERS IV SOLN: INTRAVENOUS | Status: DC

## 2012-11-15 MED ORDER — OXYCODONE HCL 5 MG/5ML PO SOLN: 5.0000 mg | Freq: Once | ORAL | Status: DC | PRN

## 2012-11-15 MED ORDER — THROMBIN 5000 UNITS EX SOLR
CUTANEOUS | Status: DC | PRN
  Administered 2012-11-15 (×2): 5000 [IU] via TOPICAL

## 2012-11-15 MED ORDER — MENTHOL 3 MG MT LOZG: 1.0000 | LOZENGE | OROMUCOSAL | Status: DC | PRN

## 2012-11-15 MED ORDER — BACITRACIN ZINC 500 UNIT/GM EX OINT
TOPICAL_OINTMENT | CUTANEOUS | Status: DC | PRN
  Administered 2012-11-15: 1 via TOPICAL

## 2012-11-15 MED ORDER — ROCURONIUM BROMIDE 100 MG/10ML IV SOLN
INTRAVENOUS | Status: DC | PRN
  Administered 2012-11-15: 50 mg via INTRAVENOUS
  Administered 2012-11-15: 10 mg via INTRAVENOUS

## 2012-11-15 MED ORDER — HYDROMORPHONE HCL PF 1 MG/ML IJ SOLN
INTRAMUSCULAR | Status: AC
  Filled 2012-11-15: qty 1

## 2012-11-15 MED ORDER — MORPHINE SULFATE 2 MG/ML IJ SOLN
1.0000 mg | INTRAMUSCULAR | Status: DC | PRN
  Administered 2012-11-15: 2 mg via INTRAVENOUS
  Administered 2012-11-16: 4 mg via INTRAVENOUS
  Filled 2012-11-15: qty 1
  Filled 2012-11-15: qty 2

## 2012-11-15 MED ORDER — OXYCODONE-ACETAMINOPHEN 5-325 MG PO TABS: 1.0000 | ORAL_TABLET | ORAL | Status: DC | PRN

## 2012-11-15 MED ORDER — LEVETIRACETAM 500 MG PO TABS
500.0000 mg | ORAL_TABLET | Freq: Two times a day (BID) | ORAL | Status: DC
  Administered 2012-11-15: 500 mg via ORAL
  Filled 2012-11-15 (×3): qty 1

## 2012-11-15 MED ORDER — HYDROMORPHONE HCL PF 1 MG/ML IJ SOLN
0.2500 mg | INTRAMUSCULAR | Status: DC | PRN
  Administered 2012-11-15 (×2): 0.5 mg via INTRAVENOUS

## 2012-11-15 MED ORDER — PROPOFOL 10 MG/ML IV BOLUS
INTRAVENOUS | Status: DC | PRN
  Administered 2012-11-15: 160 mg via INTRAVENOUS

## 2012-11-15 MED ORDER — MEPERIDINE HCL 25 MG/ML IJ SOLN: 6.2500 mg | INTRAMUSCULAR | Status: DC | PRN

## 2012-11-15 MED ORDER — 0.9 % SODIUM CHLORIDE (POUR BTL) OPTIME
TOPICAL | Status: DC | PRN
  Administered 2012-11-15: 1000 mL

## 2012-11-15 MED ORDER — BUPIVACAINE-EPINEPHRINE 0.5% -1:200000 IJ SOLN
INTRAMUSCULAR | Status: DC | PRN
  Administered 2012-11-15: 10 mL

## 2012-11-15 MED ORDER — NEOSTIGMINE METHYLSULFATE 1 MG/ML IJ SOLN
INTRAMUSCULAR | Status: DC | PRN
  Administered 2012-11-15: 3 mg via INTRAVENOUS

## 2012-11-15 MED ORDER — DEXAMETHASONE SODIUM PHOSPHATE 4 MG/ML IJ SOLN: 4.0000 mg | Freq: Four times a day (QID) | INTRAMUSCULAR | Status: AC

## 2012-11-15 MED ORDER — MIDAZOLAM HCL 5 MG/5ML IJ SOLN
INTRAMUSCULAR | Status: DC | PRN
  Administered 2012-11-15: 2 mg via INTRAVENOUS

## 2012-11-15 MED ORDER — AMLODIPINE BESYLATE 10 MG PO TABS
10.0000 mg | ORAL_TABLET | Freq: Every morning | ORAL | Status: DC
  Filled 2012-11-15: qty 1

## 2012-11-15 MED ORDER — ACETAMINOPHEN 325 MG PO TABS: 650.0000 mg | ORAL_TABLET | ORAL | Status: DC | PRN

## 2012-11-15 MED ORDER — LACTATED RINGERS IV SOLN
INTRAVENOUS | Status: DC | PRN
  Administered 2012-11-15 (×2): via INTRAVENOUS

## 2012-11-15 MED ORDER — CEFAZOLIN SODIUM-DEXTROSE 2-3 GM-% IV SOLR
2.0000 g | Freq: Three times a day (TID) | INTRAVENOUS | Status: AC
  Administered 2012-11-15 – 2012-11-16 (×2): 2 g via INTRAVENOUS
  Filled 2012-11-15 (×3): qty 50

## 2012-11-15 MED ORDER — HYDROCODONE-ACETAMINOPHEN 5-325 MG PO TABS: 1.0000 | ORAL_TABLET | ORAL | Status: DC | PRN

## 2012-11-15 MED ORDER — ONDANSETRON HCL 4 MG/2ML IJ SOLN: 4.0000 mg | INTRAMUSCULAR | Status: DC | PRN

## 2012-11-15 MED ORDER — PHENOL 1.4 % MT LIQD: 1.0000 | OROMUCOSAL | Status: DC | PRN

## 2012-11-15 MED ORDER — DOCUSATE SODIUM 100 MG PO CAPS
100.0000 mg | ORAL_CAPSULE | Freq: Two times a day (BID) | ORAL | Status: DC
  Administered 2012-11-15: 100 mg via ORAL
  Filled 2012-11-15: qty 1

## 2012-11-15 MED ORDER — LISINOPRIL 10 MG PO TABS
10.0000 mg | ORAL_TABLET | Freq: Every day | ORAL | Status: DC
  Administered 2012-11-15: 10 mg via ORAL
  Filled 2012-11-15 (×2): qty 1

## 2012-11-15 MED ORDER — PANTOPRAZOLE SODIUM 40 MG IV SOLR
40.0000 mg | Freq: Every day | INTRAVENOUS | Status: DC
  Administered 2012-11-15: 40 mg via INTRAVENOUS
  Filled 2012-11-15 (×2): qty 40

## 2012-11-15 MED ORDER — TAMSULOSIN HCL 0.4 MG PO CAPS
0.4000 mg | ORAL_CAPSULE | Freq: Every day | ORAL | Status: DC
  Administered 2012-11-15: 0.4 mg via ORAL
  Filled 2012-11-15 (×2): qty 1

## 2012-11-15 MED ORDER — LIDOCAINE HCL (CARDIAC) 20 MG/ML IV SOLN
INTRAVENOUS | Status: DC | PRN
  Administered 2012-11-15: 70 mg via INTRAVENOUS

## 2012-11-15 MED ORDER — SIMVASTATIN 20 MG PO TABS
20.0000 mg | ORAL_TABLET | Freq: Every morning | ORAL | Status: DC
  Filled 2012-11-15: qty 1

## 2012-11-15 MED ORDER — GLYCOPYRROLATE 0.2 MG/ML IJ SOLN
INTRAMUSCULAR | Status: DC | PRN
  Administered 2012-11-15: .6 mg via INTRAVENOUS

## 2012-11-15 MED ORDER — DEXAMETHASONE SODIUM PHOSPHATE 4 MG/ML IJ SOLN
INTRAMUSCULAR | Status: DC | PRN
  Administered 2012-11-15: 10 mg via INTRAVENOUS

## 2012-11-15 MED ORDER — DIAZEPAM 5 MG PO TABS: 5.0000 mg | ORAL_TABLET | Freq: Four times a day (QID) | ORAL | Status: DC | PRN

## 2012-11-15 MED ORDER — HEMOSTATIC AGENTS (NO CHARGE) OPTIME
TOPICAL | Status: DC | PRN
  Administered 2012-11-15: 1 via TOPICAL

## 2012-11-15 MED ORDER — FENTANYL CITRATE 0.05 MG/ML IJ SOLN
INTRAMUSCULAR | Status: DC | PRN
  Administered 2012-11-15: 250 ug via INTRAVENOUS
  Administered 2012-11-15: 100 ug via INTRAVENOUS

## 2012-11-15 MED ORDER — PROMETHAZINE HCL 25 MG/ML IJ SOLN: 6.2500 mg | INTRAMUSCULAR | Status: DC | PRN

## 2012-11-15 MED ORDER — SODIUM CHLORIDE 0.9 % IR SOLN
Status: DC | PRN
  Administered 2012-11-15: 15:00:00

## 2012-11-15 SURGICAL SUPPLY — 60 items
BAG DECANTER FOR FLEXI CONT (MISCELLANEOUS) ×2 IMPLANT
BENZOIN TINCTURE PRP APPL 2/3 (GAUZE/BANDAGES/DRESSINGS) ×2 IMPLANT
BIT DRILL NEURO 2X3.1 SFT TUCH (MISCELLANEOUS) ×1 IMPLANT
BLADE SURG 15 STRL LF DISP TIS (BLADE) IMPLANT
BLADE SURG 15 STRL SS (BLADE)
BLADE ULTRA TIP 2M (BLADE) ×2 IMPLANT
BRUSH SCRUB EZ PLAIN DRY (MISCELLANEOUS) ×2 IMPLANT
BUR BARREL STRAIGHT FLUTE 4.0 (BURR) ×2 IMPLANT
BUR MATCHSTICK NEURO 3.0 LAGG (BURR) ×2 IMPLANT
CANISTER SUCTION 2500CC (MISCELLANEOUS) ×2 IMPLANT
CLOTH BEACON ORANGE TIMEOUT ST (SAFETY) ×2 IMPLANT
CONT SPEC 4OZ CLIKSEAL STRL BL (MISCELLANEOUS) ×2 IMPLANT
COVER MAYO STAND STRL (DRAPES) ×2 IMPLANT
DRAPE LAPAROTOMY 100X72 PEDS (DRAPES) ×2 IMPLANT
DRAPE MICROSCOPE LEICA (MISCELLANEOUS) IMPLANT
DRAPE POUCH INSTRU U-SHP 10X18 (DRAPES) ×2 IMPLANT
DRAPE SURG 17X23 STRL (DRAPES) ×4 IMPLANT
DRILL NEURO 2X3.1 SOFT TOUCH (MISCELLANEOUS) ×2
ELECT REM PT RETURN 9FT ADLT (ELECTROSURGICAL) ×2
ELECTRODE REM PT RTRN 9FT ADLT (ELECTROSURGICAL) ×1 IMPLANT
GAUZE SPONGE 4X4 16PLY XRAY LF (GAUZE/BANDAGES/DRESSINGS) IMPLANT
GLOVE BIO SURGEON STRL SZ8.5 (GLOVE) ×2 IMPLANT
GLOVE BIOGEL PI IND STRL 7.5 (GLOVE) ×1 IMPLANT
GLOVE BIOGEL PI INDICATOR 7.5 (GLOVE) ×1
GLOVE EXAM NITRILE LRG STRL (GLOVE) IMPLANT
GLOVE EXAM NITRILE MD LF STRL (GLOVE) IMPLANT
GLOVE EXAM NITRILE XL STR (GLOVE) IMPLANT
GLOVE EXAM NITRILE XS STR PU (GLOVE) IMPLANT
GLOVE SS BIOGEL STRL SZ 8 (GLOVE) ×1 IMPLANT
GLOVE SUPERSENSE BIOGEL SZ 8 (GLOVE) ×1
GLOVE SURG SS PI 7.0 STRL IVOR (GLOVE) ×6 IMPLANT
GOWN BRE IMP SLV AUR LG STRL (GOWN DISPOSABLE) IMPLANT
GOWN BRE IMP SLV AUR XL STRL (GOWN DISPOSABLE) ×4 IMPLANT
GRAFT CORT CANC 14X8.25X11 5D (Bone Implant) IMPLANT
KIT BASIN OR (CUSTOM PROCEDURE TRAY) ×2 IMPLANT
KIT ROOM TURNOVER OR (KITS) ×2 IMPLANT
MARKER SKIN DUAL TIP RULER LAB (MISCELLANEOUS) ×2 IMPLANT
NEEDLE HYPO 22GX1.5 SAFETY (NEEDLE) ×2 IMPLANT
NEEDLE SPNL 18GX3.5 QUINCKE PK (NEEDLE) ×2 IMPLANT
NS IRRIG 1000ML POUR BTL (IV SOLUTION) ×2 IMPLANT
PACK LAMINECTOMY NEURO (CUSTOM PROCEDURE TRAY) ×2 IMPLANT
PIN DISTRACTION 14MM (PIN) ×4 IMPLANT
PLATE ACP R HYBIRD 14MM LVL1 (Plate) ×2 IMPLANT
PUTTY ABX ACTIFUSE 1.5ML (Putty) ×2 IMPLANT
RUBBERBAND STERILE (MISCELLANEOUS) IMPLANT
SCREW R HYBIRD VA 4.0X14MM (Screw) ×8 IMPLANT
SPACER ADV ACF LORODTIC 8MM (Bone Implant) ×2 IMPLANT
SPONGE GAUZE 4X4 12PLY (GAUZE/BANDAGES/DRESSINGS) ×2 IMPLANT
SPONGE INTESTINAL PEANUT (DISPOSABLE) ×2 IMPLANT
SPONGE SURGIFOAM ABS GEL SZ50 (HEMOSTASIS) ×2 IMPLANT
STRIP CLOSURE SKIN 1/2X4 (GAUZE/BANDAGES/DRESSINGS) ×2 IMPLANT
SUT VIC AB 0 CT1 27 (SUTURE) ×1
SUT VIC AB 0 CT1 27XBRD ANTBC (SUTURE) ×1 IMPLANT
SUT VIC AB 3-0 SH 8-18 (SUTURE) ×2 IMPLANT
SYR 20ML ECCENTRIC (SYRINGE) ×2 IMPLANT
TAPE CLOTH SURG 4X10 WHT LF (GAUZE/BANDAGES/DRESSINGS) ×2 IMPLANT
TOWEL OR 17X24 6PK STRL BLUE (TOWEL DISPOSABLE) ×2 IMPLANT
TOWEL OR 17X26 10 PK STRL BLUE (TOWEL DISPOSABLE) ×2 IMPLANT
TUBE CONNECTING 12X1/4 (SUCTIONS) ×4 IMPLANT
WATER STERILE IRR 1000ML POUR (IV SOLUTION) ×2 IMPLANT

## 2012-11-15 NOTE — Anesthesia Postprocedure Evaluation (Signed)
  Anesthesia Post-op Note  Patient: Derek Cox  Procedure(s) Performed: Procedure(s): ANTERIOR CERVICAL DECOMPRESSION/DISCECTOMY FUSION 1 LEVEL,CERVICAL FOUR-FIVE (N/A)  Patient Location: PACU  Anesthesia Type:General  Level of Consciousness: awake, alert , oriented and patient cooperative  Airway and Oxygen Therapy: Patient Spontanous Breathing and Patient connected to nasal cannula oxygen  Post-op Pain: mild  Post-op Assessment: Post-op Vital signs reviewed, Patient's Cardiovascular Status Stable, Respiratory Function Stable, Patent Airway, No signs of Nausea or vomiting and Pain level controlled  Post-op Vital Signs: Reviewed and stable  Complications: No apparent anesthesia complications

## 2012-11-15 NOTE — Anesthesia Procedure Notes (Signed)
Procedure Name: Intubation Date/Time: 11/15/2012 2:00 PM Performed by: Gwenyth Allegra Pre-anesthesia Checklist: Patient identified, Timeout performed, Emergency Drugs available, Suction available and Patient being monitored Patient Re-evaluated:Patient Re-evaluated prior to inductionOxygen Delivery Method: Circle system utilized Preoxygenation: Pre-oxygenation with 100% oxygen Intubation Type: IV induction Ventilation: Mask ventilation without difficulty and Oral airway inserted - appropriate to patient size Laryngoscope Size: Mac and 4 Grade View: Grade I Number of attempts: 1 Airway Equipment and Method: Stylet Secured at: 22 cm Tube secured with: Tape Dental Injury: Teeth and Oropharynx as per pre-operative assessment

## 2012-11-15 NOTE — Anesthesia Preprocedure Evaluation (Addendum)
Anesthesia Evaluation  Patient identified by MRN, date of birth, ID band Patient awake    Reviewed: Allergy & Precautions, H&P , NPO status , Patient's Chart, lab work & pertinent test results  History of Anesthesia Complications Negative for: history of anesthetic complications  Airway Mallampati: II TM Distance: >3 FB Neck ROM: Full    Dental  (+) Dental Advisory Given   Pulmonary sleep apnea (very mild by sleep study, not requiring CPAP) , former smoker,  breath sounds clear to auscultation  Pulmonary exam normal       Cardiovascular hypertension, Pt. 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 CVA, No Residual Symptoms negative psych ROS   GI/Hepatic negative GI ROS, Neg liver ROS,   Endo/Other  negative endocrine ROS  Renal/GU negative Renal ROS     Musculoskeletal   Abdominal   Peds  Hematology   Anesthesia Other Findings   Reproductive/Obstetrics                          Anesthesia Physical Anesthesia Plan  ASA: III  Anesthesia Plan: General   Post-op Pain Management:    Induction: Intravenous  Airway Management Planned: Oral ETT  Additional Equipment:   Intra-op Plan:   Post-operative Plan: Extubation in OR  Informed Consent: I have reviewed the patients History and Physical, chart, labs and discussed the procedure including the risks, benefits and alternatives for the proposed anesthesia with the patient or authorized representative who has indicated his/her understanding and acceptance.   Dental advisory given  Plan Discussed with: CRNA and Surgeon  Anesthesia Plan Comments: (Plan routine monitors, GETA)        Anesthesia Quick Evaluation

## 2012-11-15 NOTE — Transfer of Care (Signed)
Immediate Anesthesia Transfer of Care Note  Patient: Derek Cox  Procedure(s) Performed: Procedure(s): ANTERIOR CERVICAL DECOMPRESSION/DISCECTOMY FUSION 1 LEVEL,CERVICAL FOUR-FIVE (N/A)  Patient Location: PACU  Anesthesia Type:General  Level of Consciousness: awake and alert   Airway & Oxygen Therapy: Patient Spontanous Breathing and Patient connected to nasal cannula oxygen  Post-op Assessment: Report given to PACU RN and Post -op Vital signs reviewed and stable  Post vital signs: Reviewed and stable  Complications: No apparent anesthesia complications

## 2012-11-15 NOTE — Op Note (Signed)
Brief history: The patient is a 67 year old white male who was previously had a C5-6 and C6-7 anterior cervicectomy fusion and plating by another physician. He has done well for years but has developed recurrent neck and right shoulder pain. He has failed medical management and was worked up with a cervical MRI. This demonstrated a herniated disc at C4-5. I discussed the various treatment options with the patient including surgery. She has weighed the risks, benefits, and alternatives surgery and decided proceed with a C45 anterior cervix discectomy fusion and plating.  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 Delma Officer  Asst.: Aliene Beams  Anesthesia: Gen. endotracheal  Estimated blood loss: 50 cc  Drains: None  Complications: None  Description of procedure: The patient was brought to the operating room by the anesthesia team. General endotracheal anesthesia was induced. A roll was placed under the patient's shoulders to keep the neck in the neutral position. The patient's anterior cervical region was then prepared with Betadine scrub and Betadine solution. Sterile drapes were applied.  The area to be incised was then injected with Marcaine with epinephrine solution. I then used a scalpel to make a transverse incision in the patient's left anterior neck. I used the Metzenbaum scissors to divide the platysmal muscle and then to dissect medial to the sternocleidomastoid muscle, jugular vein, and carotid artery, carefully through the scar tissue from the prior operation. I carefully  dissected down towards the anterior cervical spine identifying the esophagus and retracting it medially. Then using Kitner swabs to clear soft tissue from the anterior cervical spine, exposing old anterior cervical plate.  I then used electrocautery to detach the medial border of the longus colli muscle bilaterally from the C4-5 intervertebral disc spaces. I then inserted the Caspar self-retaining retractor underneath the longus colli muscle bilaterally to provide exposure.  We then incised the intervertebral disc at C4-5. We then performed a partial intervertebral discectomy with a pituitary forceps and the Karlin curettes. I then inserted distraction screws into the vertebral bodies at 4 and C5. We then distracted the interspace. We then used the high-speed drill to decorticate the vertebral endplates at C4-5, to drill away the remainder of the intervertebral disc, to drill away some posterior spondylosis, and to thin out the posterior longitudinal ligament. I then incised ligament with the arachnoid knife. We then removed the ligament with a Kerrison punches undercutting the vertebral endplates and decompressing the thecal sac. We then performed foraminotomies about the bilateral 5 nerve roots. This completed the decompression at this level.   We now turned our to attention to the interbody fusion. We used the trial spacers to determine the appropriate size for the interbody prosthesis. We then pre-filled prosthesis with a combination of local morcellized autograft bone that we obtained during decompression as well as Actifuse bone graft extender. We then inserted the prosthesis into the distracted interspace at C4-5. We then removed the distraction screws. There was a good snug fit of the prosthesis in the interspace.   Having completed the fusion we now turned attention to the anterior spinal instrumentation. We used the high-speed drill to drill away some anterior spondylosis at the disc spaces so  that the plate lay down  flat. We selected the appropriate length titanium anterior cervical plate. We laid it along the anterior aspect of the vertebral bodies from C4-5 abutting against the old plate. We then drilled 14 mm holes at C4 and C5. We then secured the plate to the vertebral bodies by placing two 14 mm self-tapping screws at C4 and C5. We then obtained intraoperative radiograph. The demonstrating good position of the instrumentation. We tightened the screws into the locking cam. This completed the instrumentation  We then obtained hemostasis using bipolar electrocautery. We irrigated the wound out with bacitracin solution. We then removed the retractor. We inspected the esophagus for any damage. There was none apparent. We then reapproximated patient's platysmal muscle with interrupted 3-0 Vicryl suture. We then reapproximated the subcutaneous tissue with interrupted 3-0 Vicryl suture. The skin was reapproximated with Steri-Strips and benzoin. The wound was then covered with bacitracin ointment. A sterile dressing was applied. The drapes were removed. Patient was subsequently extubated by the anesthesia team and transported to the post anesthesia care unit in stable condition. All sponge instrument and needle counts were reportedly correct at the end of this case.

## 2012-11-15 NOTE — H&P (Signed)
Subjective: The patient is a 67 year old white male who's had prior neck surgery by another physician. He has done well for years but has developed recurrent neck and shoulder pain consistent with a cervical radiculopathy. He has failed medical management and was worked up with a cervical MRI. This demonstrated a herniated disc at C4-5. I discussed the various treatment options with the patient including surgery. He has weighed the risks, benefits, and alternatives surgery and decided to proceed with an exploration of the cervical fusion, removal results cervical plate and X9-1 anterior cervical discectomy, fusion, and plating.   Past Medical History  Diagnosis Date  . Hyperlipidemia   . Hypertension     echo done - 10/10  . 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 Seizures     after stroke , upon self d/c'ing Dilantin, since on Keppra, no repeat of seizure   . Stroke     2010, treated at [facility] for evacuation of ICH, then rehabed at [facility], no residual deficits   . Inguinal hernia   . Complication of anesthesia     dysuria  . History of dysuria     post surgical    Past Surgical History  Procedure Laterality Date  . Pilonidal cyst excision    . Knee surgery    . Tonsillectomy    . Mole removal      non melanoma  . Ich evacuation  2010  . Cervical fusion  2007  . Hernia repair      inguinal hernia - repair, [facility]- 20 yrs ago    . Inguinal hernia repair    . Inguinal hernia repair  08/11/2011    Procedure: LAPAROSCOPIC INGUINAL HERNIA;  Surgeon: Lodema Pilot, DO;  Location: [facility] OR;  Service: General;  Laterality: Right;  . Brain surgery    . Brain hematoma evacutation Left 2010    Allergies  Allergen Reactions  . Dilantin [Phenytoin] Other (See Comments)    "felt weird"    History  Substance Use Topics  . Smoking status: Former Smoker -- 0.50 packs/day for 20 years    Types: Cigarettes    Quit date: 08/08/1996  . Smokeless tobacco: Former Neurosurgeon    Quit  date: 07/07/1998  . Alcohol Use: 0.0 oz/week    6-7 Glasses of wine, 1-2 Cans of beer per week     Comment: one glass of wine (or +) every night     Family History  Problem Relation Age of Onset  . Anesthesia problems Neg Hx    Prior to Admission medications   Medication Sig Start Date End Date Taking? Authorizing Provider  amLODipine (NORVASC) 10 MG tablet Take 10 mg by mouth every morning.    Yes Historical Provider, MD  aspirin 325 MG tablet Take 325 mg by mouth daily.   Yes Historical Provider, MD  Cholecalciferol (VITAMIN D3) 2000 UNITS TABS Take 2,000 Units by mouth daily.   Yes Historical Provider, MD  diazepam (VALIUM) 5 MG tablet Take 5 mg by mouth at bedtime as needed for anxiety or sleep.   Yes Historical Provider, MD  levETIRAcetam (KEPPRA) 500 MG tablet Take 500 mg by mouth every 12 (twelve) hours.   Yes Historical Provider, MD  lisinopril (PRINIVIL,ZESTRIL) 10 MG tablet Take 10 mg by mouth daily.   Yes Historical Provider, MD  simvastatin (ZOCOR) 20 MG tablet Take 20 mg by mouth every morning.    Yes Historical Provider, MD  Review of Systems  Positive ROS: As above  All other systems have been reviewed and were otherwise negative with the exception of those mentioned in the HPI and as above.  Objective: Vital signs in last 24 hours: Temp:  [98.2 F (36.8 C)] 98.2 F (36.8 C) (08/21 0943) Pulse Rate:  [76] 76 (08/21 0943) Resp:  [20] 20 (08/21 0943) BP: (137)/(84) 137/84 mmHg (08/21 0943) SpO2:  [98 %] 98 % (08/21 0943)  General Appearance: Alert, cooperative, no distress, appears stated age Head: Normocephalic, without obvious abnormality, atraumatic Eyes: PERRL, conjunctiva/corneas clear, EOM's intact, fundi benign, both eyes      Ears: Normal TM's and external ear canals, both ears Throat: Lips, mucosa, and tongue normal; teeth and gums normal Neck: Supple, symmetrical, trachea midline, no adenopathy; thyroid: No enlargement/tenderness/nodules; no carotid  bruit or JVD Back: Symmetric, no curvature, ROM normal, no CVA tenderness Lungs: Clear to auscultation bilaterally, respirations unlabored Heart: Regular rate and rhythm, S1 and S2 normal, no murmur, rub or gallop Abdomen: Soft, non-tender, bowel sounds active all four quadrants, no masses, no organomegaly Extremities: Extremities normal, atraumatic, no cyanosis or edema Pulses: 2+ and symmetric all extremities Skin: Skin color, texture, turgor normal, no rashes or lesions  NEUROLOGIC:   Mental status: alert and oriented, no aphasia, good attention span, Fund of knowledge/ memory ok Motor Exam - grossly normal Sensory Exam - grossly normal Reflexes:  Coordination - grossly normal Gait - grossly normal Balance - grossly normal Cranial Nerves: I: smell Not tested  II: visual acuity  OS: Normal    OD: Normal   II: visual fields Full to confrontation  II: pupils Equal, round, reactive to light  III,VII: ptosis None  III,IV,VI: extraocular muscles  Full ROM  V: mastication Normal  V: facial light touch sensation  Normal  V,VII: corneal reflex  Present  VII: facial muscle function - upper  Normal  VII: facial muscle function - lower Normal  VIII: hearing Not tested  IX: soft palate elevation  Normal  IX,X: gag reflex Present  XI: trapezius strength  5/5  XI: sternocleidomastoid strength 5/5  XI: neck flexion strength  5/5  XII: tongue strength  Normal    Data Review Lab Results  Component Value Date   WBC 6.6 11/08/2012   HGB 16.0 11/08/2012   HCT 45.7 11/08/2012   MCV 89.6 11/08/2012   PLT 215 11/08/2012   Lab Results  Component Value Date   NA 140 11/08/2012   K 4.4 11/08/2012   CL 103 11/08/2012   CO2 25 11/08/2012   BUN 16 11/08/2012   CREATININE 1.07 11/08/2012   GLUCOSE 102* 11/08/2012   Lab Results  Component Value Date   INR 1.04 01/16/2009    Assessment/Plan: 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 I reviewed his MR scan with them and pointed out the abnormalities. We have discussed the various treatment options including surgery. I've discussed the surgical treatment option of a C4-5 anterior cervical discectomy, fusion, plating with an expiration results cervical fusion and likely removal of the old cervical plate. I described the surgery to them. I've shown him surgical models. We have discussed the risks, benefits, alternatives, and likelihood of achieving our goals with surgery. I have answered all the patient, and his wife's, questions. He has decided proceed with surgery.   Cristi Loron 11/15/2012 12:59 PM

## 2012-11-15 NOTE — Progress Notes (Signed)
Patient ID: Derek Cox, male   DOB: 1946/03/06, 67 y.o.   MRN: [id number] Subjective:  The patient is somnolent but easily arousable. He looks well. He is in no apparent distress.  Objective: Vital signs in last 24 hours: Temp:  [98.2 F (36.8 C)-99.1 F (37.3 C)] 99.1 F (37.3 C) (08/21 1620) Pulse Rate:  [76-81] 81 (08/21 1622) Resp:  [17-20] 17 (08/21 1622) BP: (137-148)/(84-89) 148/89 mmHg (08/21 1621) SpO2:  [98 %] 98 % (08/21 0943)  Intake/Output from previous day:   Intake/Output this shift: Total I/O In: 1800 [I.V.:1800] Out: 100 [Blood:100]  Physical exam the patient is Glasgow Coma Scale 13. He is moving all 4 extremities well. His deltoid strength is 5 over 5 bilaterally. His dressing is clean and dry. There is no evidence of hematoma or shift.  Lab Results: No results found for this basename: WBC, HGB, HCT, PLT,  in the last 72 hours BMET No results found for this basename: NA, K, CL, CO2, GLUCOSE, BUN, CREATININE, CALCIUM,  in the last 72 hours  Studies/Results: Dg Cervical Spine 1 View  11/15/2012   *RADIOLOGY REPORT*  Clinical Data: Cervical fusion  DG CERVICAL SPINE - 1 VIEW  Comparison: MR 06/10/2012  Findings: Single lateral intraoperative radiograph demonstrates interval changes of instrumented ACDF C4-5.  Graft markers project in the C4-5 interspace.  Previous anterior fusion hardware C5-C7 is noted.  IMPRESSION:  1.  Interval ACDF C4-5.   Original Report Authenticated By: D. Andria Rhein, MD    Assessment/Plan: The patient is doing well.  LOS: 0 days     Bubba Vanbenschoten D 11/15/2012, 4:34 PM

## 2012-11-16 ENCOUNTER — Encounter (HOSPITAL_COMMUNITY): Payer: Self-pay | Admitting: Neurosurgery

## 2012-11-16 MED ORDER — DIAZEPAM 5 MG PO TABS: 5.0000 mg | ORAL_TABLET | Freq: Four times a day (QID) | ORAL | Status: DC | PRN

## 2012-11-16 MED ORDER — TAMSULOSIN HCL 0.4 MG PO CAPS: 0.4000 mg | ORAL_CAPSULE | Freq: Every day | ORAL | Status: DC

## 2012-11-16 MED ORDER — OXYCODONE-ACETAMINOPHEN 10-325 MG PO TABS: 1.0000 | ORAL_TABLET | ORAL | Status: DC | PRN

## 2012-11-16 MED ORDER — DSS 100 MG PO CAPS: 100.0000 mg | ORAL_CAPSULE | Freq: Two times a day (BID) | ORAL | Status: DC

## 2012-11-16 NOTE — Progress Notes (Signed)
Pt being D/C'd home with Foley cath per MD order. Pt and wife taught how to care for Foley. Pt will follow up with [facility] next week for Foley removal. Pt and wife given D/C instructions with Rx's, verbal understanding given. Pt D/C'd home via walking with wife @ 1035 per MD order. Rema Fendt, RN

## 2012-11-16 NOTE — Discharge Summary (Signed)
  Physician Discharge Summary  Patient ID: Derek Cox MRN: [id number] DOB/AGE: September 03, 1945 67 y.o.  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 *   Discharged Condition: good  Hospital Course: I performed a C4-5 anterior cervical discectomy, fusion, and plating on the patient on May 20 114. The surgery went well.  The patient's postoperative course was remarkable only for urinary retention. He has had the Foley catheter replaced. He has a history of urinary retention and has been discharged with a Foley catheter  With his last surgery. We are going to attempt another removal of the Foley catheter. If the patient cannot urinate fairly normally we will replace the catheter and have him followup with his urologist.  Regarding his neck surgery the patient is doing well his arm pain is gone. He wants to go home. The patient was given oral and written discharge instructions all his questions were answered.  Consults: None Significant Diagnostic Studies: None Treatments: C4-5 intracervical discectomy, fusion, and plating. Discharge Exam: Blood pressure 130/79, pulse 94, temperature 98.2 F (36.8 C), temperature source Oral, resp. rate 18, SpO2 96.00%. The patient is alert and pleasant. He looks well. He is in no apparent distress. His dressing is clean and dry. There is no evidence of hematoma or shift. His strength is normal in all 4 extremities.  Disposition: Home     Medication List         amLODipine 10 MG tablet  Commonly known as:  NORVASC  Take 10 mg by mouth every morning.     aspirin 325 MG tablet  Take 325 mg by mouth daily.     diazepam 5 MG tablet  Commonly known as:  VALIUM  Take 5 mg by mouth at bedtime as needed for anxiety or sleep.     diazepam 5 MG tablet  Commonly known  as:  VALIUM  Take 1 tablet (5 mg total) by mouth every 6 (six) hours as needed.     DSS 100 MG Caps  Take 100 mg by mouth 2 (two) times daily.     levETIRAcetam 500 MG tablet  Commonly known as:  KEPPRA  Take 500 mg by mouth every 12 (twelve) hours.     lisinopril 10 MG tablet  Commonly known as:  PRINIVIL,ZESTRIL  Take 10 mg by mouth daily.     oxyCODONE-acetaminophen 10-325 MG per tablet  Commonly known as:  PERCOCET  Take 1 tablet by mouth every 4 (four) hours as needed for pain.     simvastatin 20 MG tablet  Commonly known as:  ZOCOR  Take 20 mg by mouth every morning.     tamsulosin 0.4 MG Caps capsule  Commonly known as:  FLOMAX  Take 1 capsule (0.4 mg total) by mouth daily after breakfast.     Vitamin D3 2000 UNITS Tabs  Take 2,000 Units by mouth daily.         SignedCristi Loron 11/16/2012, 7:42 AM

## 2012-11-16 NOTE — Progress Notes (Signed)
UR COMPLETED  

## 2012-11-20 DIAGNOSIS — R339 Retention of urine, unspecified: Secondary | ICD-10-CM | POA: Diagnosis not present

## 2012-11-22 DIAGNOSIS — R339 Retention of urine, unspecified: Secondary | ICD-10-CM | POA: Diagnosis not present

## 2012-11-28 DIAGNOSIS — Z1331 Encounter for screening for depression: Secondary | ICD-10-CM | POA: Diagnosis not present

## 2012-11-28 DIAGNOSIS — Z0000 Encounter for general adult medical examination without abnormal findings: Secondary | ICD-10-CM | POA: Diagnosis not present

## 2012-11-28 DIAGNOSIS — I10 Essential (primary) hypertension: Secondary | ICD-10-CM | POA: Diagnosis not present

## 2012-11-28 DIAGNOSIS — M542 Cervicalgia: Secondary | ICD-10-CM | POA: Diagnosis not present

## 2012-12-20 DIAGNOSIS — R39198 Other difficulties with micturition: Secondary | ICD-10-CM | POA: Diagnosis not present

## 2012-12-20 DIAGNOSIS — R339 Retention of urine, unspecified: Secondary | ICD-10-CM | POA: Diagnosis not present

## 2012-12-20 DIAGNOSIS — R351 Nocturia: Secondary | ICD-10-CM | POA: Diagnosis not present

## 2012-12-20 DIAGNOSIS — N400 Enlarged prostate without lower urinary tract symptoms: Secondary | ICD-10-CM | POA: Diagnosis not present

## 2013-01-31 ENCOUNTER — Other Ambulatory Visit: Payer: Self-pay

## 2013-02-25 DIAGNOSIS — J40 Bronchitis, not specified as acute or chronic: Secondary | ICD-10-CM | POA: Diagnosis not present

## 2013-03-01 DIAGNOSIS — N400 Enlarged prostate without lower urinary tract symptoms: Secondary | ICD-10-CM | POA: Diagnosis not present

## 2013-03-01 DIAGNOSIS — R39198 Other difficulties with micturition: Secondary | ICD-10-CM | POA: Diagnosis not present

## 2013-03-01 DIAGNOSIS — N529 Male erectile dysfunction, unspecified: Secondary | ICD-10-CM | POA: Diagnosis not present

## 2013-03-25 DIAGNOSIS — L719 Rosacea, unspecified: Secondary | ICD-10-CM | POA: Diagnosis not present

## 2013-03-25 DIAGNOSIS — D239 Other benign neoplasm of skin, unspecified: Secondary | ICD-10-CM | POA: Diagnosis not present

## 2013-03-25 DIAGNOSIS — L570 Actinic keratosis: Secondary | ICD-10-CM | POA: Diagnosis not present

## 2013-03-25 DIAGNOSIS — Z85828 Personal history of other malignant neoplasm of skin: Secondary | ICD-10-CM | POA: Diagnosis not present

## 2013-03-25 DIAGNOSIS — L821 Other seborrheic keratosis: Secondary | ICD-10-CM | POA: Diagnosis not present

## 2013-03-25 DIAGNOSIS — C44611 Basal cell carcinoma of skin of unspecified upper limb, including shoulder: Secondary | ICD-10-CM | POA: Diagnosis not present

## 2013-03-25 DIAGNOSIS — D485 Neoplasm of uncertain behavior of skin: Secondary | ICD-10-CM | POA: Diagnosis not present

## 2013-04-01 DIAGNOSIS — C44611 Basal cell carcinoma of skin of unspecified upper limb, including shoulder: Secondary | ICD-10-CM | POA: Diagnosis not present

## 2013-04-10 DIAGNOSIS — L570 Actinic keratosis: Secondary | ICD-10-CM | POA: Diagnosis not present

## 2013-05-28 DIAGNOSIS — N529 Male erectile dysfunction, unspecified: Secondary | ICD-10-CM | POA: Diagnosis not present

## 2013-05-28 DIAGNOSIS — I10 Essential (primary) hypertension: Secondary | ICD-10-CM | POA: Diagnosis not present

## 2013-06-10 DIAGNOSIS — L570 Actinic keratosis: Secondary | ICD-10-CM | POA: Diagnosis not present

## 2013-06-10 DIAGNOSIS — L819 Disorder of pigmentation, unspecified: Secondary | ICD-10-CM | POA: Diagnosis not present

## 2013-06-10 DIAGNOSIS — D239 Other benign neoplasm of skin, unspecified: Secondary | ICD-10-CM | POA: Diagnosis not present

## 2013-06-10 DIAGNOSIS — D485 Neoplasm of uncertain behavior of skin: Secondary | ICD-10-CM | POA: Diagnosis not present

## 2013-06-10 DIAGNOSIS — Z85828 Personal history of other malignant neoplasm of skin: Secondary | ICD-10-CM | POA: Diagnosis not present

## 2013-06-11 DIAGNOSIS — L570 Actinic keratosis: Secondary | ICD-10-CM | POA: Diagnosis not present

## 2013-09-16 DIAGNOSIS — J209 Acute bronchitis, unspecified: Secondary | ICD-10-CM | POA: Diagnosis not present

## 2013-11-27 DIAGNOSIS — N529 Male erectile dysfunction, unspecified: Secondary | ICD-10-CM | POA: Diagnosis not present

## 2013-11-27 DIAGNOSIS — N400 Enlarged prostate without lower urinary tract symptoms: Secondary | ICD-10-CM | POA: Diagnosis not present

## 2013-12-05 DIAGNOSIS — Z0000 Encounter for general adult medical examination without abnormal findings: Secondary | ICD-10-CM | POA: Diagnosis not present

## 2013-12-05 DIAGNOSIS — I10 Essential (primary) hypertension: Secondary | ICD-10-CM | POA: Diagnosis not present

## 2013-12-05 DIAGNOSIS — E7800 Pure hypercholesterolemia, unspecified: Secondary | ICD-10-CM | POA: Diagnosis not present

## 2013-12-05 DIAGNOSIS — G4700 Insomnia, unspecified: Secondary | ICD-10-CM | POA: Diagnosis not present

## 2014-02-27 ENCOUNTER — Ambulatory Visit: Payer: Self-pay | Admitting: Podiatry

## 2014-03-12 DIAGNOSIS — M7989 Other specified soft tissue disorders: Secondary | ICD-10-CM | POA: Diagnosis not present

## 2014-04-28 DIAGNOSIS — N529 Male erectile dysfunction, unspecified: Secondary | ICD-10-CM | POA: Diagnosis not present

## 2014-04-28 DIAGNOSIS — N400 Enlarged prostate without lower urinary tract symptoms: Secondary | ICD-10-CM | POA: Diagnosis not present

## 2014-04-30 DIAGNOSIS — J069 Acute upper respiratory infection, unspecified: Secondary | ICD-10-CM | POA: Diagnosis not present

## 2014-05-07 DIAGNOSIS — J209 Acute bronchitis, unspecified: Secondary | ICD-10-CM | POA: Diagnosis not present

## 2014-05-07 DIAGNOSIS — R05 Cough: Secondary | ICD-10-CM | POA: Diagnosis not present

## 2014-06-09 DIAGNOSIS — Z125 Encounter for screening for malignant neoplasm of prostate: Secondary | ICD-10-CM | POA: Diagnosis not present

## 2014-06-09 DIAGNOSIS — Z1211 Encounter for screening for malignant neoplasm of colon: Secondary | ICD-10-CM | POA: Diagnosis not present

## 2014-06-09 DIAGNOSIS — I10 Essential (primary) hypertension: Secondary | ICD-10-CM | POA: Diagnosis not present

## 2014-06-19 DIAGNOSIS — Z85828 Personal history of other malignant neoplasm of skin: Secondary | ICD-10-CM | POA: Diagnosis not present

## 2014-06-19 DIAGNOSIS — D225 Melanocytic nevi of trunk: Secondary | ICD-10-CM | POA: Diagnosis not present

## 2014-06-19 DIAGNOSIS — L570 Actinic keratosis: Secondary | ICD-10-CM | POA: Diagnosis not present

## 2014-06-19 DIAGNOSIS — L821 Other seborrheic keratosis: Secondary | ICD-10-CM | POA: Diagnosis not present

## 2014-06-19 DIAGNOSIS — L719 Rosacea, unspecified: Secondary | ICD-10-CM | POA: Diagnosis not present

## 2014-07-09 DIAGNOSIS — L570 Actinic keratosis: Secondary | ICD-10-CM | POA: Diagnosis not present

## 2014-07-24 DIAGNOSIS — M65842 Other synovitis and tenosynovitis, left hand: Secondary | ICD-10-CM | POA: Diagnosis not present

## 2014-08-21 DIAGNOSIS — M65842 Other synovitis and tenosynovitis, left hand: Secondary | ICD-10-CM | POA: Diagnosis not present

## 2014-09-08 DIAGNOSIS — L570 Actinic keratosis: Secondary | ICD-10-CM | POA: Diagnosis not present

## 2014-11-04 ENCOUNTER — Ambulatory Visit: Payer: BLUE CROSS/BLUE SHIELD | Admitting: Family Medicine

## 2014-11-25 ENCOUNTER — Ambulatory Visit: Payer: BLUE CROSS/BLUE SHIELD | Admitting: Family Medicine

## 2014-12-02 ENCOUNTER — Encounter: Payer: Self-pay | Admitting: Family Medicine

## 2014-12-02 ENCOUNTER — Ambulatory Visit (INDEPENDENT_AMBULATORY_CARE_PROVIDER_SITE_OTHER): Payer: Medicare Other | Admitting: Family Medicine

## 2014-12-02 VITALS — BP 122/72 | HR 77 | Temp 98.0°F | Ht 69.0 in | Wt 173.0 lb

## 2014-12-02 DIAGNOSIS — I10 Essential (primary) hypertension: Secondary | ICD-10-CM | POA: Diagnosis not present

## 2014-12-02 DIAGNOSIS — Z8673 Personal history of transient ischemic attack (TIA), and cerebral infarction without residual deficits: Secondary | ICD-10-CM | POA: Insufficient documentation

## 2014-12-02 DIAGNOSIS — Z0000 Encounter for general adult medical examination without abnormal findings: Secondary | ICD-10-CM

## 2014-12-02 DIAGNOSIS — E785 Hyperlipidemia, unspecified: Secondary | ICD-10-CM | POA: Insufficient documentation

## 2014-12-02 DIAGNOSIS — G4700 Insomnia, unspecified: Secondary | ICD-10-CM | POA: Insufficient documentation

## 2014-12-02 DIAGNOSIS — N401 Enlarged prostate with lower urinary tract symptoms: Secondary | ICD-10-CM | POA: Diagnosis not present

## 2014-12-02 DIAGNOSIS — Z8669 Personal history of other diseases of the nervous system and sense organs: Secondary | ICD-10-CM | POA: Insufficient documentation

## 2014-12-02 DIAGNOSIS — N138 Other obstructive and reflux uropathy: Secondary | ICD-10-CM | POA: Insufficient documentation

## 2014-12-02 HISTORY — DX: Personal history of transient ischemic attack (TIA), and cerebral infarction without residual deficits: Z86.73

## 2014-12-02 MED ORDER — ALPRAZOLAM 0.5 MG PO TABS: 0.5000 mg | ORAL_TABLET | Freq: Every evening | ORAL | Status: DC | PRN

## 2014-12-02 NOTE — Progress Notes (Signed)
Pre visit review using our clinic review tool, if applicable. No additional management support is needed unless otherwise documented below in the visit History of Guillain-Barre Syndrome.

## 2014-12-02 NOTE — Progress Notes (Signed)
   Subjective:    Patient ID: Derek Cox, male    DOB: 18-Jan-1946, 69 y.o.   MRN: [id number]  HPI 69 yr old male with his wife to establish with [place] after transfering from Dr. Nehemiah Settle. Currently he is doing well and has no complaints. He has a significant hx of a hemorrhagic stroke in 2010, from which he has made a complete recovery. As a result of the stroke, he underwent a craniotomy to evacuate blood. He underwent extensive PT and now has no lasting effects. 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 He no longer sees a neurologist. He will be due for a cpx in December. His BP i stable and his lipids have been controlled. He does have some BPH sx of urinary frequency, a slow stream, and some nocturia. He sees Dr. Diona Fanti for this and he tried Flomax. This did not help. He has so far not wanted to try any hormonal agents for this. His last colonoscopy was in 2006.   Review of Systems  Constitutional: Negative.   Respiratory: Negative.   Cardiovascular: Negative.   Gastrointestinal: Negative.   Genitourinary: Positive for urgency, frequency and difficulty urinating. Negative for dysuria.  Neurological: Negative.   Psychiatric/Behavioral: Negative.        Objective:   Physical Exam  Constitutional: He is oriented to person, place, and time. He appears well-developed and well-nourished.  Neck: No thyromegaly present.  Cardiovascular: Normal rate, regular rhythm, normal heart sounds and intact distal pulses.   Pulmonary/Chest: Effort normal and breath sounds normal.  Abdominal: Soft. Bowel sounds are normal. He exhibits no distension and no mass. There is no tenderness. There is no rebound and no guarding.  Lymphadenopathy:    He has no cervical adenopathy.  Neurological: He is alert and oriented to person, place, and time. No cranial nerve deficit. He exhibits normal  muscle tone. Coordination normal.          Assessment & Plan:  Introductory visit for this man who is doing well despite a significant neurologic history. Get medical records sent over. We will refer him for a colonoscopy. I suggested he try saw palmetto OTC for the BPH issue.

## 2014-12-03 ENCOUNTER — Encounter: Payer: Self-pay | Admitting: Family Medicine

## 2014-12-03 DIAGNOSIS — Z8669 Personal history of other diseases of the nervous system and sense organs: Secondary | ICD-10-CM | POA: Insufficient documentation

## 2014-12-03 HISTORY — DX: Personal history of other diseases of the nervous system and sense organs: Z86.69

## 2014-12-03 NOTE — Progress Notes (Signed)
   Subjective:    Patient ID: Derek Cox, male    DOB: Jan 26, 1946, 69 y.o.   MRN: [id number]  HPI    Review of Systems     Objective:   Physical Exam        Assessment & Plan:

## 2015-01-12 DIAGNOSIS — Z411 Encounter for cosmetic surgery: Secondary | ICD-10-CM | POA: Diagnosis not present

## 2015-01-12 DIAGNOSIS — L821 Other seborrheic keratosis: Secondary | ICD-10-CM | POA: Diagnosis not present

## 2015-02-04 ENCOUNTER — Encounter: Payer: Medicare Other | Admitting: Family Medicine

## 2015-02-05 ENCOUNTER — Ambulatory Visit (INDEPENDENT_AMBULATORY_CARE_PROVIDER_SITE_OTHER): Payer: Medicare Other | Admitting: Adult Health

## 2015-02-05 ENCOUNTER — Encounter: Payer: Self-pay | Admitting: Adult Health

## 2015-02-05 VITALS — BP 124/84 | HR 83 | Temp 97.7°F | Ht 69.0 in | Wt 175.9 lb

## 2015-02-05 DIAGNOSIS — J018 Other acute sinusitis: Secondary | ICD-10-CM

## 2015-02-05 MED ORDER — HYDROCODONE-HOMATROPINE 5-1.5 MG/5ML PO SYRP: 5.0000 mL | ORAL_SOLUTION | Freq: Four times a day (QID) | ORAL | Status: DC | PRN

## 2015-02-05 MED ORDER — DOXYCYCLINE HYCLATE 100 MG PO CAPS: 100.0000 mg | ORAL_CAPSULE | Freq: Two times a day (BID) | ORAL | Status: DC

## 2015-02-05 NOTE — Progress Notes (Signed)
  Subjective:     Derek Cox is a 69 y.o. male who presents for evaluation of sinus pain. Symptoms include: congestion, cough, nasal congestion, sinus pressure and sore throat. Onset of symptoms was 3 day ago. Symptoms have been gradually worsening since that time. Past history is significant for no history of pneumonia or bronchitis. Patient is a non-smoker.  The following portions of the patient's history were reviewed and updated as appropriate: allergies, current medications, past family history, past medical history, past social history, past surgical history and problem list.  Review of Systems Pertinent items are noted in HPI.   Objective:    BP 124/84 mmHg  Pulse 83  Temp(Src) 97.7 F (36.5 C) (Oral)  Ht 5\' 9"  (1.753 m)  Wt 175 lb 14.4 oz (79.788 kg)  BMI 25.96 kg/m2  SpO2 95%  General Appearance:    Alert, cooperative, no distress, appears stated age  Head:    Normocephalic, without obvious abnormality, atraumatic  Eyes:    PERRL, conjunctiva/corneas clear, EOM's intact, fundi    benign, both eyes       Ears:    Normal TM's and external ear canals, both ears  Nose:   Nares normal, septum midline, mucosa normal, no drainage    or sinus tenderness  Throat:   Lips, mucosa, and tongue normal; teeth and gums normal  Neck:   Supple, symmetrical, trachea midline, no adenopathy;       thyroid:  No enlargement/tenderness/nodules; no carotid   bruit or JVD  Back:     Symmetric, no curvature, ROM normal, no CVA tenderness  Lungs:     Clear to auscultation bilaterally, respirations unlabored  Chest wall:    No tenderness or deformity  Heart:    Regular rate and rhythm, S1 and S2 normal, no murmur, rub   or gallop  Abdomen:     Soft, non-tender, bowel sounds active all four quadrants,    no masses, no organomegaly  Genitalia:    Normal male without lesion, discharge or tenderness  Rectal:    Normal tone, normal prostate, no masses or tenderness;   guaiac negative stool   Extremities:   Extremities normal, atraumatic, no cyanosis or edema  Pulses:   2+ and symmetric all extremities  Skin:   Skin color, texture, turgor normal, no rashes or lesions  Lymph nodes:   Cervical, supraclavicular, and axillary nodes normal  Neurologic:   CNII-XII intact. Normal strength, sensation and reflexes      throughout      Assessment:    Acute bacterial sinusitis.    Plan:    Nasal saline sprays. Doxycycline per medication orders.

## 2015-02-05 NOTE — Patient Instructions (Addendum)
It was great meeting you today!  I have sent in a prescription for doxycyline 100mg , take this twice a day for 7 days.   Use the cough syrup as needed at night time, as this will make you sleepy  Use Mucinex during the day  Flonase and/or normal saline nasal spray will help with the sinus congestion.   Follow up if no improvement in 2-3 days or if symptoms worsen  Sinusitis, Adult Sinusitis is redness, soreness, and inflammation of the paranasal sinuses. Paranasal sinuses are air pockets within the bones of your face. They are located beneath your eyes, in the middle of your forehead, and above your eyes. In healthy paranasal sinuses, mucus is able to drain out, and air is able to circulate through them by way of your nose. However, when your paranasal sinuses are inflamed, mucus and air can become trapped. This can allow bacteria and other germs to grow and cause infection. Sinusitis can develop quickly and last only a short time (acute) or continue over a long period (chronic). Sinusitis that lasts for more than 12 weeks is considered chronic. CAUSES Causes of sinusitis include:  Allergies.  Structural abnormalities, such as displacement of the cartilage that separates your nostrils (deviated septum), which can decrease the air flow through your nose and sinuses and affect sinus drainage.  Functional abnormalities, such as when the small hairs (cilia) that line your sinuses and help remove mucus do not work properly or are not present. SIGNS AND SYMPTOMS Symptoms of acute and chronic sinusitis are the same. The primary symptoms are pain and pressure around the affected sinuses. Other symptoms include:  Upper toothache.  Earache.  Headache.  Bad breath.  Decreased sense of smell and taste.  A cough, which worsens when you are lying flat.  Fatigue.  Fever.  Thick drainage from your nose, which often is green and may contain pus (purulent).  Swelling and warmth over the  affected sinuses. DIAGNOSIS Your health care provider will perform a physical exam. During your exam, your health care provider may perform any of the following to help determine if you have acute sinusitis or chronic sinusitis:  Look in your nose for signs of abnormal growths in your nostrils (nasal polyps).  Tap over the affected sinus to check for signs of infection.  View the inside of your sinuses using an imaging device that has a light attached (endoscope). If your health care provider suspects that you have chronic sinusitis, one or more of the following tests may be recommended:  Allergy tests.  Nasal culture. A sample of mucus is taken from your nose, sent to a lab, and screened for bacteria.  Nasal cytology. A sample of mucus is taken from your nose and examined by your health care provider to determine if your sinusitis is related to an allergy. TREATMENT Most cases of acute sinusitis are related to a viral infection and will resolve on their own within 10 days. Sometimes, medicines are prescribed to help relieve symptoms of both acute and chronic sinusitis. These may include pain medicines, decongestants, nasal steroid sprays, or saline sprays. However, for sinusitis related to a bacterial infection, your health care provider will prescribe antibiotic medicines. These are medicines that will help kill the bacteria causing the infection. Rarely, sinusitis is caused by a fungal infection. In these cases, your health care provider will prescribe antifungal medicine. For some cases of chronic sinusitis, surgery is needed. Generally, these are cases in which sinusitis recurs more than 3  times per year, despite other treatments. HOME CARE INSTRUCTIONS  Drink plenty of water. Water helps thin the mucus so your sinuses can drain more easily.  Use a humidifier.  Inhale steam 3-4 times a day (for example, sit in the bathroom with the shower running).  Apply a warm, moist washcloth to  your face 3-4 times a day, or as directed by your health care provider.  Use saline nasal sprays to help moisten and clean your sinuses.  Take medicines only as directed by your health care provider.  If you were prescribed either an antibiotic or antifungal medicine, finish it all even if you start to feel better. SEEK IMMEDIATE MEDICAL CARE IF:  You have increasing pain or severe headaches.  You have nausea, vomiting, or drowsiness.  You have swelling around your face.  You have vision problems.  You have a stiff neck.  You have difficulty breathing.   This information is not intended to replace advice given to you by your health care provider. Make sure you discuss any questions you have with your health care provider.   Document Released: 03/14/2005 Document Revised: 04/04/2014 Document Reviewed: 03/29/2011 Elsevier Interactive Patient Education 2016 [place] sinusitis patient instructions here.

## 2015-02-05 NOTE — Progress Notes (Signed)
Pre visit review using our clinic review tool, if applicable. No additional management support is needed unless otherwise documented below in the visit note. 

## 2015-02-10 ENCOUNTER — Encounter: Payer: Self-pay | Admitting: Family Medicine

## 2015-02-10 ENCOUNTER — Ambulatory Visit (INDEPENDENT_AMBULATORY_CARE_PROVIDER_SITE_OTHER): Payer: Medicare Other | Admitting: Family Medicine

## 2015-02-10 VITALS — BP 138/81 | HR 69 | Temp 98.1°F | Ht 69.0 in | Wt 174.0 lb

## 2015-02-10 DIAGNOSIS — N138 Other obstructive and reflux uropathy: Secondary | ICD-10-CM

## 2015-02-10 DIAGNOSIS — M109 Gout, unspecified: Secondary | ICD-10-CM

## 2015-02-10 DIAGNOSIS — N529 Male erectile dysfunction, unspecified: Secondary | ICD-10-CM

## 2015-02-10 DIAGNOSIS — N401 Enlarged prostate with lower urinary tract symptoms: Secondary | ICD-10-CM | POA: Diagnosis not present

## 2015-02-10 DIAGNOSIS — Z0000 Encounter for general adult medical examination without abnormal findings: Secondary | ICD-10-CM

## 2015-02-10 DIAGNOSIS — I10 Essential (primary) hypertension: Secondary | ICD-10-CM

## 2015-02-10 DIAGNOSIS — M10042 Idiopathic gout, left hand: Secondary | ICD-10-CM

## 2015-02-10 LAB — CBC WITH DIFFERENTIAL/PLATELET
BASOS ABS: 0 10*3/uL (ref 0.0–0.1)
Basophils Relative: 0.5 % (ref 0.0–3.0)
EOS ABS: 0.2 10*3/uL (ref 0.0–0.7)
Eosinophils Relative: 2.9 % (ref 0.0–5.0)
HEMATOCRIT: 45.4 % (ref 39.0–52.0)
Hemoglobin: 15.4 g/dL (ref 13.0–17.0)
LYMPHS ABS: 1.7 10*3/uL (ref 0.7–4.0)
LYMPHS PCT: 28 % (ref 12.0–46.0)
MCHC: 33.9 g/dL (ref 30.0–36.0)
MCV: 91.6 fl (ref 78.0–100.0)
MONO ABS: 0.6 10*3/uL (ref 0.1–1.0)
Monocytes Relative: 9.7 % (ref 3.0–12.0)
NEUTROS ABS: 3.6 10*3/uL (ref 1.4–7.7)
NEUTROS PCT: 58.9 % (ref 43.0–77.0)
PLATELETS: 196 10*3/uL (ref 150.0–400.0)
RBC: 4.96 Mil/uL (ref 4.22–5.81)
RDW: 12.6 % (ref 11.5–15.5)
WBC: 6.1 10*3/uL (ref 4.0–10.5)

## 2015-02-10 LAB — POCT URINALYSIS DIPSTICK
BILIRUBIN UA: NEGATIVE
GLUCOSE UA: NEGATIVE
KETONES UA: NEGATIVE
Leukocytes, UA: NEGATIVE
Nitrite, UA: NEGATIVE
PH UA: 6
Protein, UA: NEGATIVE
RBC UA: NEGATIVE
Spec Grav, UA: 1.02
Urobilinogen, UA: 0.2

## 2015-02-10 LAB — BASIC METABOLIC PANEL
BUN: 17 mg/dL (ref 6–23)
CO2: 28 mEq/L (ref 19–32)
CREATININE: 1.13 mg/dL (ref 0.40–1.50)
Calcium: 9.6 mg/dL (ref 8.4–10.5)
Chloride: 105 mEq/L (ref 96–112)
GFR: 67.99 mL/min (ref >60.00)
GLUCOSE: 86 mg/dL (ref 70–99)
Potassium: 4.4 mEq/L (ref 3.5–5.1)
Sodium: 143 mEq/L (ref 135–145)

## 2015-02-10 LAB — LIPID PANEL
CHOL/HDL RATIO: 4
CHOLESTEROL: 200 mg/dL (ref 0–200)
HDL: 48.4 mg/dL (ref >39.00)
LDL CALC: 115 mg/dL — AB (ref 0–99)
NonHDL: 151.13
TRIGLYCERIDES: 179 mg/dL — AB (ref 0.0–149.0)
VLDL: 35.8 mg/dL (ref 0.0–40.0)

## 2015-02-10 LAB — HEPATIC FUNCTION PANEL
ALT: 20 U/L (ref 0–53)
AST: 25 U/L (ref 0–37)
Albumin: 4.4 g/dL (ref 3.5–5.2)
Alkaline Phosphatase: 60 U/L (ref 39–117)
BILIRUBIN DIRECT: 0.1 mg/dL (ref 0.0–0.3)
TOTAL PROTEIN: 6.7 g/dL (ref 6.0–8.3)
Total Bilirubin: 0.8 mg/dL (ref 0.2–1.2)

## 2015-02-10 LAB — TSH: TSH: 1.44 u[IU]/mL (ref 0.35–4.50)

## 2015-02-10 LAB — TESTOSTERONE: TESTOSTERONE: 305.56 ng/dL (ref 300.00–890.00)

## 2015-02-10 LAB — URIC ACID: URIC ACID, SERUM: 7.7 mg/dL (ref 4.0–7.8)

## 2015-02-10 LAB — PSA: PSA: 1.07 ng/mL (ref 0.10–4.00)

## 2015-02-10 MED ORDER — INDOMETHACIN 50 MG PO CAPS: 50.0000 mg | ORAL_CAPSULE | Freq: Three times a day (TID) | ORAL | Status: DC | PRN

## 2015-02-10 MED ORDER — TADALAFIL 20 MG PO TABS: 20.0000 mg | ORAL_TABLET | Freq: Every day | ORAL | Status: DC | PRN

## 2015-02-10 NOTE — Progress Notes (Signed)
Subjective:    Patient ID: Derek Cox, male    DOB: April 03, 1945, 69 y.o.   MRN: [id number]  HPI 69 yr old male for a well exam. He has a few issues to discuss. First he has had swelling and pain at the base of his left index finger for about 4 months. He saw Dr. Iran Planas about this and he thought this was due to gout. He had a cortisone shot in the joint which hellped for a time, but then the pain returned. He took an oral taper of prednisone but this did not help at all. The last bout of gout he had was in a great toe about 4 years ago. Also he mentions trouble with erections for the past few months. He feels that his libido is normal, but he hesitates to have sex with his wife because she suffers from severe hip pain. In fact tomorrow she is scheduled for a hip replacement surgery. When he does try to have sex his erections are soft and brief. Otherwise he has felt well. We have already referred him to our GI office for a colonoscopy and this is being set up.    Review of Systems  Constitutional: Negative.   HENT: Negative.   Eyes: Negative.   Respiratory: Negative.   Cardiovascular: Negative.   Gastrointestinal: Negative.   Genitourinary: Negative.   Musculoskeletal: Positive for arthralgias. Negative for myalgias, back pain, joint swelling, gait problem, neck pain and neck stiffness.  Skin: Negative.   Neurological: Negative.   Psychiatric/Behavioral: Negative.        Objective:   Physical Exam  Constitutional: He is oriented to person, place, and time. He appears well-developed and well-nourished. No distress.  HENT:  Head: Normocephalic and atraumatic.  Right Ear: External ear normal.  Left Ear: External ear normal.  Nose: Nose normal.  Mouth/Throat: Oropharynx is clear and moist. No oropharyngeal exudate.  Eyes: Conjunctivae and EOM are normal. Pupils are equal, round, and reactive to light. Right eye exhibits no discharge. Left eye exhibits no discharge. No scleral  icterus.  Neck: Neck supple. No JVD present. No tracheal deviation present. No thyromegaly present.  Cardiovascular: Normal rate, regular rhythm, normal heart sounds and intact distal pulses.  Exam reveals no gallop and no friction rub.   No murmur heard. Pulmonary/Chest: Effort normal and breath sounds normal. No respiratory distress. He has no wheezes. He has no rales. He exhibits no tenderness.  Abdominal: Soft. Bowel sounds are normal. He exhibits no distension and no mass. There is no tenderness. There is no rebound and no guarding.  Genitourinary: Rectum normal, prostate normal and penis normal. Guaiac negative stool. No penile tenderness.  Musculoskeletal: Normal range of motion. He exhibits no edema.  The left 2nd MCP joint is swollen and very tender. Not warm or red.   Lymphadenopathy:    He has no cervical adenopathy.  Neurological: He is alert and oriented to person, place, and time. He has normal reflexes. No cranial nerve deficit. He exhibits normal muscle tone. Coordination normal.  Skin: Skin is warm and dry. No rash noted. He is not diaphoretic. No erythema. No pallor.  Psychiatric: He has a normal mood and affect. His behavior is normal. Judgment and thought content normal.          Assessment & Plan:  Well exam. Get fasting labs. We will check a testosterone level for the erectile dysfunction and I gave him some samples to try Cialis. The finger pain does  sound like it is due to gout, so we will check a uric acid level. Try Indocin for the pain.

## 2015-02-10 NOTE — Progress Notes (Signed)
Pre visit review using our clinic review tool, if applicable. No additional management support is needed unless otherwise documented below in the visit note. 

## 2015-03-10 DIAGNOSIS — M659 Synovitis and tenosynovitis, unspecified: Secondary | ICD-10-CM | POA: Diagnosis not present

## 2015-04-27 ENCOUNTER — Other Ambulatory Visit: Payer: Self-pay | Admitting: Family Medicine

## 2015-04-27 NOTE — Telephone Encounter (Signed)
Call in #[phone] with 2 rf 

## 2015-05-22 DIAGNOSIS — R351 Nocturia: Secondary | ICD-10-CM | POA: Diagnosis not present

## 2015-05-22 DIAGNOSIS — R3912 Poor urinary stream: Secondary | ICD-10-CM | POA: Diagnosis not present

## 2015-05-22 DIAGNOSIS — N5201 Erectile dysfunction due to arterial insufficiency: Secondary | ICD-10-CM | POA: Diagnosis not present

## 2015-05-22 DIAGNOSIS — N401 Enlarged prostate with lower urinary tract symptoms: Secondary | ICD-10-CM | POA: Diagnosis not present

## 2015-05-28 ENCOUNTER — Other Ambulatory Visit: Payer: Self-pay | Admitting: Family Medicine

## 2015-05-28 ENCOUNTER — Encounter: Payer: Self-pay | Admitting: Family Medicine

## 2015-05-28 MED ORDER — LISINOPRIL 20 MG PO TABS: 20.0000 mg | ORAL_TABLET | Freq: Every day | ORAL | Status: DC

## 2015-05-28 MED ORDER — SIMVASTATIN 20 MG PO TABS: 20.0000 mg | ORAL_TABLET | Freq: Every day | ORAL | Status: DC

## 2015-05-28 MED ORDER — LEVETIRACETAM 500 MG PO TABS: 500.0000 mg | ORAL_TABLET | Freq: Two times a day (BID) | ORAL | Status: DC

## 2015-05-28 NOTE — Telephone Encounter (Signed)
I sent the 3 meds to [facility]. Please call in 6 months supply of xanax to [facility]

## 2015-05-29 ENCOUNTER — Encounter: Payer: Self-pay | Admitting: Family Medicine

## 2015-05-29 ENCOUNTER — Ambulatory Visit (INDEPENDENT_AMBULATORY_CARE_PROVIDER_SITE_OTHER): Payer: Medicare Other | Admitting: Family Medicine

## 2015-05-29 VITALS — BP 130/80 | HR 83 | Temp 98.5°F | Ht 69.0 in | Wt 172.0 lb

## 2015-05-29 DIAGNOSIS — S61451A Open bite of right hand, initial encounter: Secondary | ICD-10-CM | POA: Diagnosis not present

## 2015-05-29 DIAGNOSIS — W540XXA Bitten by dog, initial encounter: Secondary | ICD-10-CM

## 2015-05-29 DIAGNOSIS — Z23 Encounter for immunization: Secondary | ICD-10-CM

## 2015-05-29 MED ORDER — ALPRAZOLAM 0.5 MG PO TABS: ORAL_TABLET | ORAL | Status: DC

## 2015-05-29 MED ORDER — AMOXICILLIN-POT CLAVULANATE 875-125 MG PO TABS: 1.0000 | ORAL_TABLET | Freq: Two times a day (BID) | ORAL | Status: DC

## 2015-05-29 NOTE — Progress Notes (Signed)
   Subjective:    Patient ID: Derek Cox, male    DOB: 15-Jan-1946, 70 y.o.   MRN: [id number]  HPI Here for a dog bite on the right hand that he received one week ago. The dog belongs to his daughter and he was playing with her when she became excited and bit him. The dogs behavior has otherwise been normal and her shots are up to date. The spot was painful at first and feels better now, but it remains red and puffy. No fever.    Review of Systems  Constitutional: Negative.   Skin: Positive for wound.       Objective:   Physical Exam  Constitutional: He appears well-developed and well-nourished.  Skin:  Dorsal right hand has a superficial puncture wound which is tender and is surrounded by a zone of edema, erythema, and warmth. No streaking. Full ROM of the fingers.           Assessment & Plan:  Cellulitis secondary to a dog bite. Cover with Augmentin. Given a TDaP.

## 2015-05-29 NOTE — Progress Notes (Signed)
Pre visit review using our clinic review tool, if applicable. No additional management support is needed unless otherwise documented below in the visit note. 

## 2015-05-29 NOTE — Addendum Note (Signed)
Addended by: Aggie Hacker A on: 05/29/2015 03:44 PM   Modules accepted: Orders

## 2015-05-29 NOTE — Telephone Encounter (Signed)
Pt is here now for a office visit, will print script and give to pt.

## 2015-05-29 NOTE — Telephone Encounter (Signed)
Pt is here now for office visit, will print script and give to pt.

## 2015-05-29 NOTE — Addendum Note (Signed)
Addended by: Aggie Hacker A on: 05/29/2015 04:07 PM   Modules accepted: Orders

## 2015-06-25 DIAGNOSIS — L570 Actinic keratosis: Secondary | ICD-10-CM | POA: Diagnosis not present

## 2015-06-25 DIAGNOSIS — Z23 Encounter for immunization: Secondary | ICD-10-CM | POA: Diagnosis not present

## 2015-06-25 DIAGNOSIS — L821 Other seborrheic keratosis: Secondary | ICD-10-CM | POA: Diagnosis not present

## 2015-06-25 DIAGNOSIS — L719 Rosacea, unspecified: Secondary | ICD-10-CM | POA: Diagnosis not present

## 2015-06-25 DIAGNOSIS — D225 Melanocytic nevi of trunk: Secondary | ICD-10-CM | POA: Diagnosis not present

## 2015-06-25 DIAGNOSIS — Z85828 Personal history of other malignant neoplasm of skin: Secondary | ICD-10-CM | POA: Diagnosis not present

## 2015-07-14 DIAGNOSIS — L570 Actinic keratosis: Secondary | ICD-10-CM | POA: Diagnosis not present

## 2015-07-14 DIAGNOSIS — M65842 Other synovitis and tenosynovitis, left hand: Secondary | ICD-10-CM | POA: Diagnosis not present

## 2015-08-03 ENCOUNTER — Encounter: Payer: Self-pay | Admitting: Family Medicine

## 2015-08-04 ENCOUNTER — Telehealth: Payer: Self-pay | Admitting: Family Medicine

## 2015-08-04 NOTE — Telephone Encounter (Signed)
The denial appeal was rec'd for the simvastatin and levetiracetam.  [employer] would like to withdraw the appeal for the simvastatin it was approved by [employer], b/c it should be coming at a lower cost.  They will go ahead and approve the levetiracetam.  And if you could give a call back by the end of the day 5/10 it will be greatly appreciated.

## 2015-08-05 NOTE — Telephone Encounter (Signed)
I sent this to Selinda Eon, this looks like a duplicate note.

## 2015-08-13 ENCOUNTER — Telehealth: Payer: Self-pay | Admitting: Family Medicine

## 2015-08-13 NOTE — Telephone Encounter (Signed)
Tier exception for levetiracetam 500mg  was denied. In order to have a tier exception there has to be a lower cost drug on the formulary but there is not in this case.

## 2015-08-13 NOTE — Telephone Encounter (Signed)
Simvastatin 20mg  is approved through 03/27/16

## 2015-08-14 NOTE — Telephone Encounter (Signed)
His  Insurance company refuses to cover his seizure medication (Keppra), so either can continue to use it but pay cash for it, or he will need a substitute. If he wants to take a substitute, I will need to refer him to a Neurologist to choose what would be appropriate. Please ask what he wants to do

## 2015-09-09 ENCOUNTER — Encounter: Payer: Self-pay | Admitting: Family Medicine

## 2015-09-09 ENCOUNTER — Ambulatory Visit (INDEPENDENT_AMBULATORY_CARE_PROVIDER_SITE_OTHER): Payer: Medicare Other | Admitting: Family Medicine

## 2015-09-09 VITALS — BP 118/71 | HR 85 | Temp 98.4°F | Ht 69.0 in | Wt 174.0 lb

## 2015-09-09 DIAGNOSIS — L0292 Furuncle, unspecified: Secondary | ICD-10-CM

## 2015-09-09 DIAGNOSIS — G4700 Insomnia, unspecified: Secondary | ICD-10-CM | POA: Diagnosis not present

## 2015-09-09 DIAGNOSIS — L570 Actinic keratosis: Secondary | ICD-10-CM | POA: Diagnosis not present

## 2015-09-09 MED ORDER — TEMAZEPAM 30 MG PO CAPS: 30.0000 mg | ORAL_CAPSULE | Freq: Every evening | ORAL | Status: DC | PRN

## 2015-09-09 MED ORDER — DOXYCYCLINE HYCLATE 100 MG PO CAPS: 100.0000 mg | ORAL_CAPSULE | Freq: Two times a day (BID) | ORAL | Status: AC

## 2015-09-09 NOTE — Progress Notes (Signed)
Pre visit review using our clinic review tool, if applicable. No additional management support is needed unless otherwise documented below in the visit note. 

## 2015-09-09 NOTE — Progress Notes (Signed)
   Subjective:    Patient ID: Derek Cox, male    DOB: July 14, 1945, 70 y.o.   MRN: [id number]  HPI Here for 3 days of a painful lump in the perirectal area. He has never had a boil before. He wonders if this could be a hemorrhoid, also which he has never had before. Having a BM is painful, but there has been no bleeding. Also he mentions trouble sleeping. This has bothered him off and on for years. He tried Ambien for awhile and it worked very well but he stopped it for fear of possible side effects.   Review of Systems  Constitutional: Negative.   Gastrointestinal: Positive for rectal pain. Negative for nausea, vomiting, abdominal pain, diarrhea, constipation, blood in stool, abdominal distention and anal bleeding.  Psychiatric/Behavioral: Positive for sleep disturbance. Negative for behavioral problems, confusion, dysphoric mood, decreased concentration and agitation. The patient is not nervous/anxious.        Objective:   Physical Exam  Constitutional: He is oriented to person, place, and time. He appears well-developed and well-nourished. No distress.  Genitourinary:  There is a very tender lump in the perineum about 2 cm for the anus  Neurological: He is alert and oriented to person, place, and time.  Psychiatric: He has a normal mood and affect. His behavior is normal. Judgment and thought content normal.          Assessment & Plan:  He has a perineal boil, and this was lanced with a scalpel. We had immediate return of purulent material and a sample was sent for a culture. He quickly had some relief of his pain. Cover with Doxycycline for 7 days. Use hot tub soaks prn. As for the insomnia, try Temazepam 30 mg prn.  [name], MD

## 2015-09-12 LAB — WOUND CULTURE: Gram Stain: NONE SEEN

## 2015-09-21 DIAGNOSIS — M79642 Pain in left hand: Secondary | ICD-10-CM | POA: Diagnosis not present

## 2015-10-21 DIAGNOSIS — D485 Neoplasm of uncertain behavior of skin: Secondary | ICD-10-CM | POA: Diagnosis not present

## 2015-10-22 DIAGNOSIS — D044 Carcinoma in situ of skin of scalp and neck: Secondary | ICD-10-CM | POA: Diagnosis not present

## 2015-11-02 ENCOUNTER — Encounter (HOSPITAL_COMMUNITY): Payer: Self-pay | Admitting: Emergency Medicine

## 2015-11-02 ENCOUNTER — Ambulatory Visit (HOSPITAL_COMMUNITY)
Admission: EM | Admit: 2015-11-02 | Discharge: 2015-11-02 | Disposition: A | Discharge status: Home / Self Care | Payer: Medicare Other | Attending: Emergency Medicine | Admitting: Emergency Medicine

## 2015-11-02 DIAGNOSIS — M65842 Other synovitis and tenosynovitis, left hand: Secondary | ICD-10-CM | POA: Diagnosis not present

## 2015-11-02 DIAGNOSIS — L03116 Cellulitis of left lower limb: Secondary | ICD-10-CM | POA: Diagnosis not present

## 2015-11-02 MED ORDER — PREDNISONE 50 MG PO TABS: ORAL_TABLET | ORAL | 0 refills | Status: DC

## 2015-11-02 MED ORDER — TRAMADOL HCL 50 MG PO TABS: 50.0000 mg | ORAL_TABLET | Freq: Four times a day (QID) | ORAL | 0 refills | Status: DC | PRN

## 2015-11-02 MED ORDER — CEPHALEXIN 500 MG PO CAPS: 500.0000 mg | ORAL_CAPSULE | Freq: Four times a day (QID) | ORAL | 0 refills | Status: DC

## 2015-11-02 NOTE — Discharge Instructions (Signed)
I'm concerned that you have developed an infection in the skin of your leg. This may be secondary to a bug bite. Take Keflex 4 times a day for 1 week. Take prednisone daily for 5 days. Keep the foot elevated to help with the swelling. You can use Tylenol and ibuprofen as needed for pain. Use the tramadol every 6-8 hours as needed for severe pain. Please come back if this is not improving in 48 hours or if any time it is worsening.

## 2015-11-02 NOTE — ED Triage Notes (Signed)
The patient presented to the [facility] with a complaint of pain and swelling to his lower left leg that he believed to be from a possible insect bite that occurred yesterday.

## 2015-11-02 NOTE — ED Provider Notes (Signed)
[place]    CSN: [id number] Arrival date & time: 11/02/15  1442  First Provider Contact:  First MD Initiated Contact with Patient 11/02/15 1535        History   Chief Complaint Chief Complaint  Patient presents with  . Insect Bite    HPI Derek Cox is a 70 y.o. male.   He is a 70 year old man here for evaluation of left lower extremity insect bite. He states he was out doing yard work yesterday. Yesterday evening and today he developed pain, redness, and swelling to the medial left lower leg. It has gradually been getting worse. He did not see if anything bit him. No fevers. No nausea or vomiting.    Past Medical History:  Diagnosis Date  . BPH with urinary obstruction    sees Dr. Diona Fanti  . Complication of anesthesia    dysuria  . 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 History of dysuria    post surgical  . Hyperlipidemia   . Hypertension    echo done - 10/10  . Inguinal hernia   . Seizures ([place])    after stroke , upon self d/c'ing Dilantin, since on Keppra, no repeat of seizure   . 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 Stroke [facility])    2010, treated at [place] for evacuation of ICH, then rehabed at [place], no residual deficits     Patient Active Problem List   Diagnosis Date Noted  . 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  . HTN (hypertension) 12/02/2014  . Hyperlipidemia 12/02/2014  . Hx of completed stroke 12/02/2014  . 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/2014  . Insomnia 12/02/2014  . Hx of seizure disorder 12/02/2014    Past Surgical History:  Procedure Laterality Date  . ANTERIOR CERVICAL DECOMP/DISCECTOMY FUSION N/A 11/15/2012   Procedure: ANTERIOR CERVICAL DECOMPRESSION/DISCECTOMY FUSION 1 LEVEL,CERVICAL FOUR-FIVE;  Surgeon: Ophelia Charter, MD;  Location: [facility]UR[facility] ORS;  Service: Neurosurgery;  Laterality: N/A;  . BRAIN HEMATOMA EVACUATION Left 2010  . BRAIN SURGERY    . CERVICAL FUSION  2007  .  COLONOSCOPY  2006   per Dr. Timmothy Euler, clear, repeat 10 yrs   . HERNIA REPAIR Bilateral    inguinal hernia - repair, [facility]- 20 yrs ago    . [place] evacuation  2010  . INGUINAL HERNIA REPAIR  08/11/2011   Procedure: LAPAROSCOPIC INGUINAL HERNIA;  Surgeon: Madilyn Hook, DO;  Location: [place];  Service: General;  Laterality: Right;  . INGUINAL HERNIA REPAIR    . KNEE SURGERY    . MOLE REMOVAL     non melanoma  . PILONIDAL CYST EXCISION    . TONSILLECTOMY         Home Medications    Prior to Admission medications   Medication Sig Start Date End Date Taking? Authorizing Provider  ALPRAZolam Duanne Moron) 0.5 MG tablet TAKE 1 TABLET AT BEDTIME AS NEEDED FOR ANXIETY 05/29/15   [name], MD  aspirin 325 MG tablet Take 325 mg by mouth daily.    Historical Provider, MD  cephALEXin (KEFLEX) 500 MG capsule Take 1 capsule (500 mg total) by mouth 4 (four) times daily. 11/02/15   Melony Overly, MD  indomethacin (INDOCIN) 50 MG capsule Take 1 capsule (50 mg total) by mouth 3 (three) times daily as needed (gout pain). 02/10/15   [name], MD  levETIRAcetam (KEPPRA) 500 MG tablet Take 1 tablet (500 mg  total) by mouth every 12 (twelve) hours. 05/28/15   [name], MD  lisinopril (PRINIVIL,ZESTRIL) 20 MG tablet Take 1 tablet (20 mg total) by mouth daily. 05/28/15   [name], MD  predniSONE (DELTASONE) 50 MG tablet Take 1 pill daily for 5 days. 11/02/15   Melony Overly, MD  simvastatin (ZOCOR) 20 MG tablet Take 1 tablet (20 mg total) by mouth daily. 05/28/15   [name], MD  temazepam (RESTORIL) 30 MG capsule Take 1 capsule (30 mg total) by mouth at bedtime as needed for sleep. 09/09/15   [name], MD  traMADol (ULTRAM) 50 MG tablet Take 1 tablet (50 mg total) by mouth every 6 (six) hours as needed. 11/02/15   Melony Overly, MD    Family History Family History  Problem Relation Age of Onset  . Anesthesia problems Neg Hx   . Stroke Father   . Lung cancer Father     Social History Social History    Substance Use Topics  . Smoking status: Former Smoker    Packs/day: 0.50    Years: 20.00    Types: Cigarettes    Quit date: 08/08/1996  . Smokeless tobacco: Former Systems developer    Quit date: 07/07/1998  . Alcohol use 0.0 oz/week     Comment: glass of wine each night     Allergies   Dilantin [phenytoin]   Review of Systems Review of Systems  Constitutional: Negative for chills and fever.  Skin: Positive for color change.     Physical Exam Triage Vital Signs ED Triage Vitals  Enc Vitals Group     BP 11/02/15 1522 109/71     Pulse Rate 11/02/15 1522 83     Resp 11/02/15 1522 12     Temp 11/02/15 1522 99 F (37.2 C)     Temp Source 11/02/15 1522 Oral     SpO2 11/02/15 1522 96 %     Weight --      Height --      Head Circumference --      Peak Flow --      Pain Score 11/02/15 1534 9     Pain Loc --      Pain Edu? --      Excl. in [place]? --    No data found.   Updated Vital Signs BP 109/71 (BP Location: Left Arm)   Pulse 83   Temp 99 F (37.2 C) (Oral)   Resp 12   SpO2 96%   Visual Acuity Right Eye Distance:   Left Eye Distance:   Bilateral Distance:    Right Eye Near:   Left Eye Near:    Bilateral Near:     Physical Exam  Constitutional: He is oriented to person, place, and time. He appears well-developed and well-nourished. No distress.  Cardiovascular: Normal rate.   Pulmonary/Chest: Effort normal.  Neurological: He is alert and oriented to person, place, and time.  Skin:  He has a 10 x 6 cm area of erythema, warmth, swelling, and tenderness to the left medial lower leg. This extends just into the dorsum of the foot. No fluctuance appreciated. There does appear to be a 3 mm clear blister at the superior aspect of redness. 2+ DP pulse.     UC Treatments / Results  Labs (all labs ordered are listed, but only abnormal results are displayed) Labs Reviewed - No data to display  EKG  EKG Interpretation None  Radiology No results  found.  Procedures Procedures (including critical care time)  Medications Ordered in UC Medications - No data to display   Initial Impression / Assessment and Plan / UC Course  I have reviewed the triage vital signs and the nursing notes.  Pertinent labs & imaging results that were available during my care of the patient were reviewed by me and considered in my medical decision making (see chart for details).  Clinical Course    Concern for cellulitis versus large local reaction. Treat with Keflex and prednisone. Prescription for tramadol provided to use as needed for pain. Follow-up if not improving in the next 2 days.  Final Clinical Impressions(s) / UC Diagnoses   Final diagnoses:  Cellulitis of left lower extremity    New Prescriptions Discharge Medication List as of 11/02/2015  4:06 PM    START taking these medications   Details  cephALEXin (KEFLEX) 500 MG capsule Take 1 capsule (500 mg total) by mouth 4 (four) times daily., Starting Mon 11/02/2015, Normal    predniSONE (DELTASONE) 50 MG tablet Take 1 pill daily for 5 days., Normal    traMADol (ULTRAM) 50 MG tablet Take 1 tablet (50 mg total) by mouth every 6 (six) hours as needed., Starting Mon 11/02/2015, Normal         Melony Overly, MD 11/02/15 1623

## 2015-11-04 ENCOUNTER — Encounter: Payer: Self-pay | Admitting: Gastroenterology

## 2015-11-12 DIAGNOSIS — C4442 Squamous cell carcinoma of skin of scalp and neck: Secondary | ICD-10-CM | POA: Diagnosis not present

## 2015-11-12 DIAGNOSIS — L570 Actinic keratosis: Secondary | ICD-10-CM | POA: Diagnosis not present

## 2015-12-17 DIAGNOSIS — D044 Carcinoma in situ of skin of scalp and neck: Secondary | ICD-10-CM | POA: Diagnosis not present

## 2015-12-17 DIAGNOSIS — L570 Actinic keratosis: Secondary | ICD-10-CM | POA: Diagnosis not present

## 2015-12-17 DIAGNOSIS — Z23 Encounter for immunization: Secondary | ICD-10-CM | POA: Diagnosis not present

## 2015-12-22 ENCOUNTER — Ambulatory Visit (AMBULATORY_SURGERY_CENTER): Payer: Self-pay

## 2015-12-22 ENCOUNTER — Telehealth: Payer: Self-pay

## 2015-12-22 VITALS — Ht 69.0 in | Wt 175.4 lb

## 2015-12-22 DIAGNOSIS — Z1211 Encounter for screening for malignant neoplasm of colon: Secondary | ICD-10-CM

## 2015-12-22 MED ORDER — NA SULFATE-K SULFATE-MG SULF 17.5-3.13-1.6 GM/177ML PO SOLN: ORAL | 0 refills | Status: DC

## 2015-12-22 NOTE — Telephone Encounter (Signed)
Dr Havery Moros, This pt is scheduled for his colon on 01/05/16. He has a complicated medical hx and states he needs to be catherized after sedation,  due to unable to urinate. He states this has been going on over 10 years. He sees Dr. Diona Fanti (urologist). Does pt need an OV with you or a direct colon at the hospital? Please advise, Thanks

## 2015-12-22 NOTE — Progress Notes (Signed)
Per pt, no allergies to soy or egg products.Pt not taking any weight loss meds or using  O2 at home.   Pt had a colon over 11 years ago with [facility]. A medical release form was signed and sent to [facility] LPN on 3rd floor.

## 2015-12-23 NOTE — Telephone Encounter (Signed)
I reviewed his Epic chart, although there is not a lot in there recently regarding his medical issues. That being said, I don't believe he has any significant cardiopulmonary reasons why he needs to be done at the hospital and is not on any high risk medications. You can ask anesthesia about the catheterization issue, but I don't see a clear reason from brief review of his chart why he can't be done at the [facility]

## 2015-12-24 NOTE — Telephone Encounter (Signed)
Kristen,  This pt is cleared for anesthetic care at [facility].  Jules Vidovich 

## 2015-12-24 NOTE — Telephone Encounter (Signed)
Asked J Monday CRNA to review the chart and ok to proceed at [facility].

## 2015-12-24 NOTE — Telephone Encounter (Signed)
John or [employer],  Could you please review this pt's chart?  Is he ok for [facility]?    Thanks, [employer]

## 2016-01-05 ENCOUNTER — Encounter: Payer: Medicare Other | Admitting: Gastroenterology

## 2016-01-12 ENCOUNTER — Other Ambulatory Visit: Payer: Self-pay | Admitting: Family Medicine

## 2016-01-12 NOTE — Telephone Encounter (Signed)
Call in #[phone] with 2 rf 

## 2016-01-12 NOTE — Telephone Encounter (Signed)
Called into [id number] - [place], [place] - [address]: [phone]  

## 2016-02-26 ENCOUNTER — Telehealth: Payer: Self-pay | Admitting: Family Medicine

## 2016-02-26 MED ORDER — ALPRAZOLAM 0.5 MG PO TABS: ORAL_TABLET | ORAL | 1 refills | Status: DC

## 2016-02-26 NOTE — Telephone Encounter (Signed)
Rx called in to pharmacy. 

## 2016-02-26 NOTE — Telephone Encounter (Signed)
Call in Xanax 0.5 mg to take qhs for sleep, #90 with one rf

## 2016-03-17 ENCOUNTER — Telehealth: Payer: Self-pay | Admitting: Family Medicine

## 2016-03-17 NOTE — Telephone Encounter (Signed)
Error/njr °

## 2016-03-24 ENCOUNTER — Other Ambulatory Visit: Payer: Self-pay | Admitting: Family Medicine

## 2016-03-24 NOTE — Telephone Encounter (Signed)
Pt is requesting refill. Please advise.

## 2016-03-25 NOTE — Telephone Encounter (Signed)
Call in #[phone] with 5 rf 

## 2016-04-07 ENCOUNTER — Encounter: Payer: Self-pay | Admitting: Family Medicine

## 2016-04-07 ENCOUNTER — Ambulatory Visit (INDEPENDENT_AMBULATORY_CARE_PROVIDER_SITE_OTHER): Payer: Medicare Other | Admitting: Family Medicine

## 2016-04-07 VITALS — BP 138/88 | HR 75 | Temp 98.1°F | Ht 69.0 in | Wt 174.0 lb

## 2016-04-07 DIAGNOSIS — I10 Essential (primary) hypertension: Secondary | ICD-10-CM | POA: Diagnosis not present

## 2016-04-07 DIAGNOSIS — N138 Other obstructive and reflux uropathy: Secondary | ICD-10-CM

## 2016-04-07 DIAGNOSIS — N401 Enlarged prostate with lower urinary tract symptoms: Secondary | ICD-10-CM | POA: Diagnosis not present

## 2016-04-07 DIAGNOSIS — Z0000 Encounter for general adult medical examination without abnormal findings: Secondary | ICD-10-CM

## 2016-04-07 DIAGNOSIS — E782 Mixed hyperlipidemia: Secondary | ICD-10-CM | POA: Diagnosis not present

## 2016-04-07 DIAGNOSIS — G4700 Insomnia, unspecified: Secondary | ICD-10-CM

## 2016-04-07 DIAGNOSIS — Z8673 Personal history of transient ischemic attack (TIA), and cerebral infarction without residual deficits: Secondary | ICD-10-CM | POA: Diagnosis not present

## 2016-04-07 LAB — POC URINALSYSI DIPSTICK (AUTOMATED)
Bilirubin, UA: NEGATIVE
GLUCOSE UA: NEGATIVE
KETONES UA: NEGATIVE
Nitrite, UA: NEGATIVE
Protein, UA: NEGATIVE
SPEC GRAV UA: 1.02
Urobilinogen, UA: 0.2
pH, UA: 6

## 2016-04-07 LAB — HEPATIC FUNCTION PANEL
ALT: 20 U/L (ref 0–53)
AST: 24 U/L (ref 0–37)
Albumin: 4.4 g/dL (ref 3.5–5.2)
Alkaline Phosphatase: 55 U/L (ref 39–117)
BILIRUBIN DIRECT: 0.1 mg/dL (ref 0.0–0.3)
BILIRUBIN TOTAL: 0.8 mg/dL (ref 0.2–1.2)
TOTAL PROTEIN: 6.6 g/dL (ref 6.0–8.3)

## 2016-04-07 LAB — CBC WITH DIFFERENTIAL/PLATELET
BASOS PCT: 0.7 % (ref 0.0–3.0)
Basophils Absolute: 0 10*3/uL (ref 0.0–0.1)
EOS PCT: 3.4 % (ref 0.0–5.0)
Eosinophils Absolute: 0.2 10*3/uL (ref 0.0–0.7)
HCT: 43.6 % (ref 39.0–52.0)
Hemoglobin: 15 g/dL (ref 13.0–17.0)
LYMPHS ABS: 1.4 10*3/uL (ref 0.7–4.0)
Lymphocytes Relative: 28.4 % (ref 12.0–46.0)
MCHC: 34.3 g/dL (ref 30.0–36.0)
MCV: 90.2 fl (ref 78.0–100.0)
MONOS PCT: 13.3 % — AB (ref 3.0–12.0)
Monocytes Absolute: 0.7 10*3/uL (ref 0.1–1.0)
NEUTROS ABS: 2.7 10*3/uL (ref 1.4–7.7)
NEUTROS PCT: 54.2 % (ref 43.0–77.0)
Platelets: 203 10*3/uL (ref 150.0–400.0)
RBC: 4.84 Mil/uL (ref 4.22–5.81)
RDW: 12.6 % (ref 11.5–15.5)
WBC: 4.9 10*3/uL (ref 4.0–10.5)

## 2016-04-07 LAB — TSH: TSH: 1.84 u[IU]/mL (ref 0.35–4.50)

## 2016-04-07 LAB — LIPID PANEL
CHOLESTEROL: 230 mg/dL — AB (ref 0–200)
HDL: 64.3 mg/dL (ref >39.00)
LDL CALC: 142 mg/dL — AB (ref 0–99)
NonHDL: 166.08
TRIGLYCERIDES: 120 mg/dL (ref 0.0–149.0)
Total CHOL/HDL Ratio: 4
VLDL: 24 mg/dL (ref 0.0–40.0)

## 2016-04-07 LAB — BASIC METABOLIC PANEL
BUN: 18 mg/dL (ref 6–23)
CHLORIDE: 105 meq/L (ref 96–112)
CO2: 30 mEq/L (ref 19–32)
Calcium: 9.4 mg/dL (ref 8.4–10.5)
Creatinine, Ser: 1.15 mg/dL (ref 0.40–1.50)
GFR: 66.41 mL/min (ref >60.00)
Glucose, Bld: 86 mg/dL (ref 70–99)
Potassium: 4.2 mEq/L (ref 3.5–5.1)
Sodium: 143 mEq/L (ref 135–145)

## 2016-04-07 LAB — PSA: PSA: 2.17 ng/mL (ref 0.10–4.00)

## 2016-04-07 MED ORDER — ALPRAZOLAM 1 MG PO TABS: 1.0000 mg | ORAL_TABLET | Freq: Every evening | ORAL | 5 refills | Status: DC | PRN

## 2016-04-07 MED ORDER — ROSUVASTATIN CALCIUM 5 MG PO TABS: 5.0000 mg | ORAL_TABLET | Freq: Every day | ORAL | 3 refills | Status: DC

## 2016-04-07 MED ORDER — FINASTERIDE 5 MG PO TABS: 5.0000 mg | ORAL_TABLET | Freq: Every day | ORAL | 3 refills | Status: DC

## 2016-04-07 NOTE — Progress Notes (Signed)
Pre visit review using our clinic review tool, if applicable. No additional management support is needed unless otherwise documented below in the visit note. 

## 2016-04-07 NOTE — Progress Notes (Signed)
Subjective:    Patient ID: Derek Cox, male    DOB: 07/23/1945, 71 y.o.   MRN: [id number]  HPI 71 yr old male to follow up. He has a few issues to discuss. First he has increasing difficulty passing his urine. He gets up 2-3 times a night to urinate and it is very difficult to empty his bladder. His stream is slow but not uncomfortable. He has tried Flomax with poor results. He saw Dr. Nira Conn a few months ago but he did not suggest any type of alternative treatments. Also Truman Hayward asks if his Xanax dose can be increased. If he takes two tabs of the 0.5 mg at bedtime he can sleep well. Also he has diffuse joint aches and he thinks these are the result of taking Simvastatin. He was on 20 mg daily and even cut this back to every other day, but the pains persist.    Review of Systems  Constitutional: Negative.   HENT: Negative.   Eyes: Negative.   Respiratory: Negative.   Cardiovascular: Negative.   Gastrointestinal: Negative.   Genitourinary: Positive for difficulty urinating and frequency. Negative for decreased urine volume, discharge, dysuria, enuresis, flank pain, genital sores, hematuria, penile pain, penile swelling, scrotal swelling, testicular pain and urgency.  Musculoskeletal: Positive for arthralgias and myalgias. Negative for back pain, gait problem, joint swelling, neck pain and neck stiffness.  Skin: Negative.   Neurological: Negative.   Psychiatric/Behavioral: Negative.        Objective:   Physical Exam  Constitutional: He is oriented to person, place, and time. He appears well-developed and well-nourished. No distress.  HENT:  Head: Normocephalic and atraumatic.  Right Ear: External ear normal.  Left Ear: External ear normal.  Nose: Nose normal.  Mouth/Throat: Oropharynx is clear and moist. No oropharyngeal exudate.  Eyes: Conjunctivae and EOM are normal. Pupils are equal, round, and reactive to light. Right eye exhibits no discharge. Left eye exhibits no  discharge. No scleral icterus.  Neck: Neck supple. No JVD present. No tracheal deviation present. No thyromegaly present.  Cardiovascular: Normal rate, regular rhythm, normal heart sounds and intact distal pulses.  Exam reveals no gallop and no friction rub.   No murmur heard. Pulmonary/Chest: Effort normal and breath sounds normal. No respiratory distress. He has no wheezes. He has no rales. He exhibits no tenderness.  Abdominal: Soft. Bowel sounds are normal. He exhibits no distension and no mass. There is no tenderness. There is no rebound and no guarding.  Genitourinary: Rectum normal and penis normal. Rectal exam shows guaiac negative stool. No penile tenderness.  Genitourinary Comments: Prostate is mildly enlarged but smooth, not tender   Musculoskeletal: Normal range of motion. He exhibits no edema or tenderness.  Lymphadenopathy:    He has no cervical adenopathy.  Neurological: He is alert and oriented to person, place, and time. He has normal reflexes. No cranial nerve deficit. He exhibits normal muscle tone. Coordination normal.  Skin: Skin is warm and dry. No rash noted. He is not diaphoretic. No erythema. No pallor.  Psychiatric: He has a normal mood and affect. His behavior is normal. Judgment and thought content normal.          Assessment & Plan:  His HTN is stable. For sleep we will increase the Xanax to 1 mg qhs. For the lipids we will switch from Simvastatin to Crestor 5 mg daily. Get fasting labs today. For the BPH, he will try Proscar 5 mg daily.  Alysia Penna, MD

## 2016-06-01 DIAGNOSIS — L814 Other melanin hyperpigmentation: Secondary | ICD-10-CM | POA: Diagnosis not present

## 2016-06-01 DIAGNOSIS — L821 Other seborrheic keratosis: Secondary | ICD-10-CM | POA: Diagnosis not present

## 2016-06-01 DIAGNOSIS — L570 Actinic keratosis: Secondary | ICD-10-CM | POA: Diagnosis not present

## 2016-06-01 DIAGNOSIS — D225 Melanocytic nevi of trunk: Secondary | ICD-10-CM | POA: Diagnosis not present

## 2016-06-01 DIAGNOSIS — Z85828 Personal history of other malignant neoplasm of skin: Secondary | ICD-10-CM | POA: Diagnosis not present

## 2016-06-01 DIAGNOSIS — D1801 Hemangioma of skin and subcutaneous tissue: Secondary | ICD-10-CM | POA: Diagnosis not present

## 2016-06-01 DIAGNOSIS — L111 Transient acantholytic dermatosis [Grover]: Secondary | ICD-10-CM | POA: Diagnosis not present

## 2016-06-03 DIAGNOSIS — R3912 Poor urinary stream: Secondary | ICD-10-CM | POA: Diagnosis not present

## 2016-06-03 DIAGNOSIS — N5201 Erectile dysfunction due to arterial insufficiency: Secondary | ICD-10-CM | POA: Diagnosis not present

## 2016-06-03 DIAGNOSIS — R351 Nocturia: Secondary | ICD-10-CM | POA: Diagnosis not present

## 2016-06-03 DIAGNOSIS — N401 Enlarged prostate with lower urinary tract symptoms: Secondary | ICD-10-CM | POA: Diagnosis not present

## 2016-06-16 ENCOUNTER — Other Ambulatory Visit: Payer: Self-pay | Admitting: Family Medicine

## 2016-06-20 ENCOUNTER — Other Ambulatory Visit: Payer: Self-pay | Admitting: Urology

## 2016-06-23 ENCOUNTER — Other Ambulatory Visit: Payer: Self-pay | Admitting: Urology

## 2016-07-13 NOTE — Patient Instructions (Signed)
Derek Cox  07/13/2016   Your procedure is scheduled on: 07/25/16  Report to [facility] Main  Entrance [name]  elevators to 3rd floor to  [place] at     0730 AM.    Call this number if you have problems the morning of surgery [phone]    Remember: ONLY 1 PERSON MAY GO WITH YOU TO SHORT STAY TO GET  READY MORNING OF [place].  Do not eat food or drink liquids :After Midnight.     Take these medicines the morning of surgery with A SIP OF WATER: Keppra, xanax if needed                                You may not have any metal on your body including hair pins and              piercings  Do not wear jewelry,lotions, powders or perfumes, deodorant                       Men may shave face and neck.   Do not bring valuables to the hospital. [place].  Contacts, dentures or bridgework may not be worn into surgery. .     Patients discharged the day of surgery will not be allowed to drive home.  Name and phone number of your driver:  Special Instructions: N/A              Please read over the following fact sheets you were given: _____________________________________________________________________             [facility] - Preparing for Surgery Before surgery, you can play an important role.  Because skin is not sterile, your skin needs to be as free of germs as possible.  You can reduce the number of germs on your skin by washing with CHG (chlorahexidine gluconate) soap before surgery.  CHG is an antiseptic cleaner which kills germs and bonds with the skin to continue killing germs even after washing. Please DO NOT use if you have an allergy to CHG or antibacterial soaps.  If your skin becomes reddened/irritated stop using the CHG and inform your nurse when you arrive at Short Stay. Do not shave (including legs and underarms) for at least 48 hours prior to the first CHG shower.  You  may shave your face/neck. Please follow these instructions carefully:  1.  Shower with CHG Soap the night before surgery and the  morning of Surgery.  2.  If you choose to wash your hair, wash your hair first as usual with your  normal  shampoo.  3.  After you shampoo, rinse your hair and body thoroughly to remove the  shampoo.                           4.  Use CHG as you would any other liquid soap.  You can apply chg directly  to the skin and wash                       Gently with a scrungie or clean washcloth.  5.  Apply the CHG  Soap to your body ONLY FROM THE NECK DOWN.   Do not use on face/ open                           Wound or open sores. Avoid contact with eyes, ears mouth and genitals (private parts).                       Wash face,  Genitals (private parts) with your normal soap.             6.  Wash thoroughly, paying special attention to the area where your surgery  will be performed.  7.  Thoroughly rinse your body with warm water from the neck down.  8.  DO NOT shower/wash with your normal soap after using and rinsing off  the CHG Soap.                9.  Pat yourself dry with a clean towel.            10.  Wear clean pajamas.            11.  Place clean sheets on your bed the night of your first shower and do not  sleep with pets. Day of Surgery : Do not apply any lotions/deodorants the morning of surgery.  Please wear clean clothes to the hospital/surgery center.  FAILURE TO FOLLOW THESE INSTRUCTIONS MAY RESULT IN THE CANCELLATION OF YOUR SURGERY PATIENT SIGNATURE_________________________________  NURSE SIGNATURE__________________________________  ________________________________________________________________________

## 2016-07-15 ENCOUNTER — Encounter (HOSPITAL_COMMUNITY): Payer: Self-pay

## 2016-07-15 ENCOUNTER — Encounter (HOSPITAL_COMMUNITY)
Admission: RE | Admit: 2016-07-15 | Discharge: 2016-07-15 | Disposition: A | Discharge status: Home / Self Care | Payer: Medicare Other | Source: Ambulatory Visit | Attending: Urology | Admitting: Urology

## 2016-07-15 DIAGNOSIS — N400 Enlarged prostate without lower urinary tract symptoms: Secondary | ICD-10-CM | POA: Insufficient documentation

## 2016-07-15 DIAGNOSIS — Z01810 Encounter for preprocedural cardiovascular examination: Secondary | ICD-10-CM | POA: Insufficient documentation

## 2016-07-15 DIAGNOSIS — Z01812 Encounter for preprocedural laboratory examination: Secondary | ICD-10-CM | POA: Diagnosis not present

## 2016-07-15 HISTORY — DX: Malignant (primary) neoplasm, unspecified: C80.1

## 2016-07-15 HISTORY — DX: Unspecified osteoarthritis, unspecified site: M19.90

## 2016-07-15 LAB — CBC
HCT: 43.7 % (ref 39.0–52.0)
HEMOGLOBIN: 15 g/dL (ref 13.0–17.0)
MCH: 31.2 pg (ref 26.0–34.0)
MCHC: 34.3 g/dL (ref 30.0–36.0)
MCV: 90.9 fL (ref 78.0–100.0)
PLATELETS: 205 10*3/uL (ref 150–400)
RBC: 4.81 MIL/uL (ref 4.22–5.81)
RDW: 12.6 % (ref 11.5–15.5)
WBC: 5.2 10*3/uL (ref 4.0–10.5)

## 2016-07-15 LAB — BASIC METABOLIC PANEL
ANION GAP: 4 — AB (ref 5–15)
BUN: 16 mg/dL (ref 6–20)
CALCIUM: 8.8 mg/dL — AB (ref 8.9–10.3)
CO2: 27 mmol/L (ref 22–32)
CREATININE: 1.11 mg/dL (ref 0.61–1.24)
Chloride: 107 mmol/L (ref 101–111)
GFR calc Af Amer: >60 mL/min (ref >60)
GFR calc non Af Amer: >60 mL/min (ref >60)
GLUCOSE: 95 mg/dL (ref 65–99)
Potassium: 4.3 mmol/L (ref 3.5–5.1)
Sodium: 138 mmol/L (ref 135–145)

## 2016-07-15 NOTE — Progress Notes (Signed)
[facility] Dr. Sarajane Jews 04/07/16 epic

## 2016-07-24 NOTE — H&P (Signed)
Urology History and Physical Exam  CC: enlarged prostate HPI: 71 year old male presents for a Urolift procedure for BPH with obstructive symptomatology. He has an IPS score of 22, QOL score of 5. Sx's include frequency,urgency, nocturia, intermittency and a slow stream.  PMH: Past Medical History:  Diagnosis Date  . Arthritis    Left hand  . BPH with urinary obstruction    sees Dr. Diona Fanti  . Cancer ([place])    skin cancers removed NON melanoma  . Complication of anesthesia    dysuria  . 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 History of dysuria    post surgical  . Hyperlipidemia   . Hypertension    echo done - 10/10  . Inguinal hernia   . Seizures ([place])    after stroke , upon self d/c'ing Dilantin, since on Keppra, no repeat of seizure   . 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 Stroke [facility])    2010, treated at [place] for evacuation of ICH, then rehabed at [facility], no residual deficits     PSH: Past Surgical History:  Procedure Laterality Date  . ANTERIOR CERVICAL DECOMP/DISCECTOMY FUSION N/A 11/15/2012   Procedure: ANTERIOR CERVICAL DECOMPRESSION/DISCECTOMY FUSION 1 LEVEL,CERVICAL FOUR-FIVE;  Surgeon: Ophelia Charter, MD;  Location: [facility]URO ORS;  Service: Neurosurgery;  Laterality: N/A;  . BRAIN HEMATOMA EVACUATION Left 2010  . BRAIN SURGERY     bleeding on brain  . CERVICAL FUSION  2007   2 surgeries  . COLONOSCOPY  2006   per Dr. Timmothy Euler, clear, repeat 10 yrs   . HERNIA REPAIR Bilateral    inguinal hernia - repair, [facility]- 20 yrs ago    . [place] evacuation  2010  . INGUINAL HERNIA REPAIR  08/11/2011   Procedure: LAPAROSCOPIC INGUINAL HERNIA;  Surgeon: Madilyn Hook, DO;  Location: [facility];  Service: General;  Laterality: Right;  . INGUINAL HERNIA REPAIR    . KNEE SURGERY    . MOLE REMOVAL     non melanoma  . PILONIDAL CYST EXCISION    . TONSILLECTOMY      Allergies: Allergies  Allergen Reactions  . Dilantin [Phenytoin] Other (See Comments)   "felt weird"    Medications: No prescriptions prior to admission.     Social History: Social History   Social History  . Marital status: Married    Spouse name: N/A  . Number of children: N/A  . Years of education: N/A   Occupational History  . Not on file.   Social History Main Topics  . Smoking status: Former Smoker    Packs/day: 0.50    Years: 20.00    Types: Cigarettes    Quit date: 08/08/1996  . Smokeless tobacco: Never Used  . Alcohol use 0.0 oz/week     Comment: glass of wine each night  . Drug use: No  . Sexual activity: Yes   Other Topics Concern  . Not on file   Social History Narrative  . No narrative on file    Family History: Family History  Problem Relation Age of Onset  . Stroke Father   . Lung cancer Father   . Anesthesia problems Neg Hx     Review of Systems: Positive: LUTS as described above. Negative: A further 10 point review of systems was negative except what is listed in the HPI.  Physical Exam: @VITALS2 @ General: No acute distress.  Awake. Head:  Normocephalic.  Atraumatic. ENT:  EOMI.  Mucous membranes moist Neck:  Supple.  No lymphadenopathy. CV:  S1 present. S2 present. Regular rate. Pulmonary: Equal effort bilaterally.  Clear to auscultation bilaterally. Abdomen: Soft.  Non tender to palpation. Skin:  Normal turgor.  No visible rash. Extremity: No gross deformity of bilateral upper extremities.  No gross deformity of                             lower extremities. Neurologic: Alert. Appropriate mood.    Studies:  No results for input(s): HGB, WBC, PLT in the last 72 hours.  No results for input(s): NA, K, CL, CO2, BUN, CREATININE, CALCIUM, GFRNONAA, GFRAA in the last 72 hours.  Invalid input(s): MAGNESIUM   No results for input(s): INR, APTT in the last 72 hours.  Invalid input(s): PT   Invalid input(s): ABG    Assessment:  BPH with obstruction  Plan: Urolift procedure

## 2016-07-25 ENCOUNTER — Ambulatory Visit (HOSPITAL_COMMUNITY): Payer: Medicare Other | Admitting: Anesthesiology

## 2016-07-25 ENCOUNTER — Encounter (HOSPITAL_COMMUNITY): Payer: Self-pay | Admitting: *Deleted

## 2016-07-25 ENCOUNTER — Encounter (HOSPITAL_COMMUNITY)
Admission: RE | Disposition: A | Discharge status: Home / Self Care | Payer: Self-pay | Source: Ambulatory Visit | Attending: Urology

## 2016-07-25 ENCOUNTER — Ambulatory Visit (HOSPITAL_COMMUNITY)
Admission: RE | Admit: 2016-07-25 | Discharge: 2016-07-25 | Disposition: A | Discharge status: Home / Self Care | Payer: Medicare Other | Source: Ambulatory Visit | Attending: Urology | Admitting: Urology

## 2016-07-25 DIAGNOSIS — I10 Essential (primary) hypertension: Secondary | ICD-10-CM | POA: Insufficient documentation

## 2016-07-25 DIAGNOSIS — R3915 Urgency of urination: Secondary | ICD-10-CM | POA: Insufficient documentation

## 2016-07-25 DIAGNOSIS — G4730 Sleep apnea, unspecified: Secondary | ICD-10-CM | POA: Insufficient documentation

## 2016-07-25 DIAGNOSIS — Z85828 Personal history of other malignant neoplasm of skin: Secondary | ICD-10-CM | POA: Insufficient documentation

## 2016-07-25 DIAGNOSIS — K4090 Unilateral inguinal hernia, without obstruction or gangrene, not specified as recurrent: Secondary | ICD-10-CM | POA: Diagnosis not present

## 2016-07-25 DIAGNOSIS — R3912 Poor urinary stream: Secondary | ICD-10-CM | POA: Diagnosis not present

## 2016-07-25 DIAGNOSIS — Z888 Allergy status to other drugs, medicaments and biological substances status: Secondary | ICD-10-CM | POA: Diagnosis not present

## 2016-07-25 DIAGNOSIS — R351 Nocturia: Secondary | ICD-10-CM | POA: Insufficient documentation

## 2016-07-25 DIAGNOSIS — R350 Frequency of micturition: Secondary | ICD-10-CM | POA: Insufficient documentation

## 2016-07-25 DIAGNOSIS — Z87891 Personal history of nicotine dependence: Secondary | ICD-10-CM | POA: Diagnosis not present

## 2016-07-25 DIAGNOSIS — Z8669 Personal history of other diseases of the nervous system and sense organs: Secondary | ICD-10-CM | POA: Insufficient documentation

## 2016-07-25 DIAGNOSIS — M1990 Unspecified osteoarthritis, unspecified site: Secondary | ICD-10-CM | POA: Diagnosis not present

## 2016-07-25 DIAGNOSIS — N138 Other obstructive and reflux uropathy: Secondary | ICD-10-CM | POA: Insufficient documentation

## 2016-07-25 DIAGNOSIS — E785 Hyperlipidemia, unspecified: Secondary | ICD-10-CM | POA: Diagnosis not present

## 2016-07-25 DIAGNOSIS — N401 Enlarged prostate with lower urinary tract symptoms: Secondary | ICD-10-CM | POA: Insufficient documentation

## 2016-07-25 DIAGNOSIS — R569 Unspecified convulsions: Secondary | ICD-10-CM | POA: Diagnosis not present

## 2016-07-25 DIAGNOSIS — Z8673 Personal history of transient ischemic attack (TIA), and cerebral infarction without residual deficits: Secondary | ICD-10-CM | POA: Diagnosis not present

## 2016-07-25 DIAGNOSIS — Z981 Arthrodesis status: Secondary | ICD-10-CM | POA: Insufficient documentation

## 2016-07-25 HISTORY — PX: CYSTOSCOPY WITH INSERTION OF UROLIFT: SHX6678

## 2016-07-25 SURGERY — CYSTOSCOPY WITH INSERTION OF UROLIFT
Anesthesia: General

## 2016-07-25 MED ORDER — CEPHALEXIN 500 MG PO CAPS: 500.0000 mg | ORAL_CAPSULE | Freq: Two times a day (BID) | ORAL | 0 refills | Status: DC

## 2016-07-25 MED ORDER — FENTANYL CITRATE (PF) 100 MCG/2ML IJ SOLN
INTRAMUSCULAR | Status: AC
  Filled 2016-07-25: qty 2

## 2016-07-25 MED ORDER — FENTANYL CITRATE (PF) 100 MCG/2ML IJ SOLN: 25.0000 ug | INTRAMUSCULAR | Status: DC | PRN

## 2016-07-25 MED ORDER — LIDOCAINE HCL (CARDIAC) 20 MG/ML IV SOLN
INTRAVENOUS | Status: DC | PRN
  Administered 2016-07-25: 50 mg via INTRAVENOUS

## 2016-07-25 MED ORDER — FENTANYL CITRATE (PF) 100 MCG/2ML IJ SOLN
INTRAMUSCULAR | Status: DC | PRN
  Administered 2016-07-25: 50 ug via INTRAVENOUS

## 2016-07-25 MED ORDER — MEPERIDINE HCL 50 MG/ML IJ SOLN: 6.2500 mg | INTRAMUSCULAR | Status: DC | PRN

## 2016-07-25 MED ORDER — LIDOCAINE 2% (20 MG/ML) 5 ML SYRINGE
INTRAMUSCULAR | Status: AC
  Filled 2016-07-25: qty 5

## 2016-07-25 MED ORDER — PROPOFOL 10 MG/ML IV BOLUS
INTRAVENOUS | Status: DC | PRN
Start: 2016-07-25 — End: 2016-07-25
  Administered 2016-07-25: 60 mg via INTRAVENOUS
  Administered 2016-07-25: 140 mg via INTRAVENOUS

## 2016-07-25 MED ORDER — PROMETHAZINE HCL 25 MG/ML IJ SOLN: 6.2500 mg | INTRAMUSCULAR | Status: DC | PRN

## 2016-07-25 MED ORDER — ACETAMINOPHEN 325 MG PO TABS: 650.0000 mg | ORAL_TABLET | ORAL | Status: DC | PRN

## 2016-07-25 MED ORDER — MIDAZOLAM HCL 2 MG/2ML IJ SOLN: 0.5000 mg | Freq: Once | INTRAMUSCULAR | Status: DC | PRN

## 2016-07-25 MED ORDER — OXYCODONE HCL 5 MG PO TABS
5.0000 mg | ORAL_TABLET | ORAL | Status: DC | PRN
  Administered 2016-07-25: 5 mg via ORAL
  Filled 2016-07-25: qty 1

## 2016-07-25 MED ORDER — ACETAMINOPHEN 650 MG RE SUPP
650.0000 mg | RECTAL | Status: DC | PRN
  Filled 2016-07-25: qty 1

## 2016-07-25 MED ORDER — CEFAZOLIN SODIUM-DEXTROSE 2-4 GM/100ML-% IV SOLN
2.0000 g | INTRAVENOUS | Status: AC
  Administered 2016-07-25: 2 g via INTRAVENOUS
  Filled 2016-07-25: qty 100

## 2016-07-25 MED ORDER — ONDANSETRON HCL 4 MG/2ML IJ SOLN
INTRAMUSCULAR | Status: AC
  Filled 2016-07-25: qty 2

## 2016-07-25 MED ORDER — STERILE WATER FOR IRRIGATION IR SOLN
Status: DC | PRN
  Administered 2016-07-25: 3000 mL

## 2016-07-25 MED ORDER — TRAMADOL HCL 50 MG PO TABS: 50.0000 mg | ORAL_TABLET | Freq: Four times a day (QID) | ORAL | 0 refills | Status: DC | PRN

## 2016-07-25 MED ORDER — SODIUM CHLORIDE 0.9 % IV SOLN: 250.0000 mL | INTRAVENOUS | Status: DC | PRN

## 2016-07-25 MED ORDER — LACTATED RINGERS IV SOLN
INTRAVENOUS | Status: DC
  Administered 2016-07-25: 09:00:00 via INTRAVENOUS

## 2016-07-25 MED ORDER — ONDANSETRON HCL 4 MG/2ML IJ SOLN
INTRAMUSCULAR | Status: DC | PRN
  Administered 2016-07-25: 4 mg via INTRAVENOUS

## 2016-07-25 MED ORDER — PROPOFOL 10 MG/ML IV BOLUS
INTRAVENOUS | Status: AC
  Filled 2016-07-25: qty 20

## 2016-07-25 MED ORDER — SODIUM CHLORIDE 0.9% FLUSH: 3.0000 mL | Freq: Two times a day (BID) | INTRAVENOUS | Status: DC

## 2016-07-25 MED ORDER — SODIUM CHLORIDE 0.9% FLUSH: 3.0000 mL | INTRAVENOUS | Status: DC | PRN

## 2016-07-25 SURGICAL SUPPLY — 10 items
BAG URO CATCHER STRL LF (MISCELLANEOUS) ×3 IMPLANT
COVER SURGICAL LIGHT HANDLE (MISCELLANEOUS) ×3 IMPLANT
GLOVE BIOGEL M 8.0 STRL (GLOVE) ×3 IMPLANT
GOWN STRL REUS W/TWL XL LVL3 (GOWN DISPOSABLE) ×6 IMPLANT
MANIFOLD NEPTUNE II (INSTRUMENTS) ×3 IMPLANT
PACK CYSTO (CUSTOM PROCEDURE TRAY) ×3 IMPLANT
SYSTEM UROLIFT (Male Continence) ×12 IMPLANT
TUBING CONNECTING 10 (TUBING) ×2 IMPLANT
TUBING CONNECTING 10' (TUBING) ×1
WATER STERILE IRR 3000ML UROMA (IV SOLUTION) IMPLANT

## 2016-07-25 NOTE — Interval H&P Note (Signed)
History and Physical Interval Note:  07/25/2016 9:28 AM  Derek Cox  has presented today for surgery, with the diagnosis of BENIGN PROSTATIC HYPERPLASIA  The various methods of treatment have been discussed with the patient and family. After consideration of risks, benefits and other options for treatment, the patient has consented to  Procedure(s): CYSTOSCOPY WITH INSERTION OF UROLIFT (N/A) as a surgical intervention .  The patient's history has been reviewed, patient examined, no change in status, stable for surgery.  I have reviewed the patient's chart and labs.  Questions were answered to the patient's satisfaction.     Jorja Loa

## 2016-07-25 NOTE — Anesthesia Preprocedure Evaluation (Addendum)
Anesthesia Evaluation  Patient identified by MRN, date of birth, ID band  History of Anesthesia Complications Negative for: history of anesthetic complications  Airway Mallampati: II  TM Distance: >3 FB Neck ROM: Full    Dental  (+) Dental Advisory Given   Pulmonary sleep apnea (mild, does not need CPAP) , former smoker,    breath sounds clear to auscultation       Cardiovascular hypertension, Pt. on medications (-) angina Rhythm:Regular Rate:Normal  '10 ECHO: EF 55-60%, valves OK   Neuro/Psych Seizures -,  s/p ICH 2010 h/o Guillian-Barre: sensory only, was always able to walk CVA, No Residual Symptoms    GI/Hepatic negative GI ROS, Neg liver ROS,   Endo/Other  negative endocrine ROS  Renal/GU negative Renal ROS   BPH    Musculoskeletal  (+) Arthritis ,   Abdominal   Peds  Hematology negative hematology ROS (+)   Anesthesia Other Findings   Reproductive/Obstetrics                            Anesthesia Physical Anesthesia Plan  ASA: III  Anesthesia Plan: General   Post-op Pain Management:    Induction: Intravenous  Airway Management Planned: LMA  Additional Equipment:   Intra-op Plan:   Post-operative Plan:   Informed Consent: I have reviewed the patients History and Physical, chart, labs and discussed the procedure including the risks, benefits and alternatives for the proposed anesthesia with the patient or authorized representative who has indicated his/her understanding and acceptance.   Dental advisory given  Plan Discussed with: CRNA and Surgeon  Anesthesia Plan Comments: (Plan routine monitors, GA- LMA ok)        Anesthesia Quick Evaluation

## 2016-07-25 NOTE — Discharge Instructions (Signed)

## 2016-07-25 NOTE — Op Note (Signed)
Preoperative diagnosis: BPH with obstructive symptomatology.  Postoperative diagnosis: Same  Principal procedure: Urolift procedure, with the placement of 4 implants.  Surgeon: Diona Fanti  Anesthesia: Gen. with LMA  Complications: None  Drains: None  Estimated blood loss: Less than 25 mL  Indications: 71 -year-old male with obstructive symptomatology secondary to BPH.  The patient's symptoms have progressed, and he has requested further management.  Management options including TURP with resection/ablation of the prostate as well as Urolift were discussed.  The patient has chosen to have a Urolift procedure.  He has been instructed to the procedure as well as risks and complications which include but are not limited to infection, bleeding, and inadequate treatment with the Urolift procedure alone, anesthetic complications, among others.  He understands these and desires to proceed.  Findings: Using the 17 French cystoscope, urethra and bladder were inspected.  There were no urethral lesions.  Prostatic urethra was obstructed secondary to bilobar hypertrophy.  The bladder was inspected circumferentially.  This revealed normal findings.  Description of procedure: The patient was properly identified in the holding area.  He received preoperative IV antibiotics.  He was taken to the operating room where general anesthetic was administered with the LMA.  He is placed in the dorsolithotomy position.  Genitalia and perineum were prepped and draped.  Proper timeout was performed.  A 70F cystoscope was inserted into the bladder. The cystoscopy bridge was replaced with a UroLift delivery device.The first treatment site was the patient's right side approximately 1.5cm distal to the bladder neck. The distal tip of the delivery device was then angled laterally approximately 20 degrees at this position to compress the lateral lobe. The trigger was pulled, thereby deploying a needle containing the implant  through the prostate. The needle was then retracted, allowing one end of the implant to be delivered to the capsular surface of the prostate. The implant was then tensioned to assure capsular seating and removal of slack monofilament. The device was then angled back toward midline and slowly advanced proximally until cystoscopic verification of the monofilament being centered in the delivery bay. The urethral end piece was then affixed to the monofilament thereby tailoring the size of the implant. Excess filament was then severed. The delivery device was then re-advanced into the bladder. The delivery device was then replaced with cystoscope and bridge and the implant location and opening effect was confirmed cystoscopically. The same procedure was then repeated on the left side, and two additional implants were delivered just proximal to the veru montanum, again one on right and one on left side of the prostate, following the same technique. A final cystoscopy was conducted first to inspect the location and state of each implant and second, to confirm the presence of a continuous anterior channel was present through the prostatic urethra with irrigation flow turned off. 4 Implants were delivered in total.  Following this, the scope was removed after approximately 150 cc of water was instilled in the bladder. He was then awakened and taken to the PACU in stable condition.  He tolerated the procedure well.

## 2016-07-25 NOTE — Anesthesia Postprocedure Evaluation (Signed)
Anesthesia Post Note  Patient: Rodgerick Gilliand  Procedure(s) Performed: Procedure(s) (LRB): CYSTOSCOPY WITH INSERTION OF UROLIFT x4 (N/A)  Patient location during evaluation: PACU Anesthesia Type: General Level of consciousness: awake and alert, oriented and patient cooperative Pain management: pain level controlled Vital Signs Assessment: post-procedure vital signs reviewed and stable Respiratory status: spontaneous breathing, nonlabored ventilation and respiratory function stable Cardiovascular status: blood pressure returned to baseline and stable Postop Assessment: no signs of nausea or vomiting Anesthetic complications: no       Last Vitals:  Vitals:   07/25/16 1056 07/25/16 1150  BP: (!) 172/97 (!) 167/85  Pulse: 61 63  Resp: 12 18  Temp: 36.4 C     Last Pain:  Vitals:   07/25/16 1045  PainSc: 0-No pain                 Venus Gilles,E. Nadia Viar

## 2016-07-25 NOTE — Anesthesia Procedure Notes (Signed)
Procedure Name: LMA Insertion Date/Time: 07/25/2016 9:44 AM Performed by: Glory Buff Pre-anesthesia Checklist: Patient identified, Emergency Drugs available, Patient being monitored and Suction available Patient Re-evaluated:Patient Re-evaluated prior to inductionOxygen Delivery Method: Circle system utilized Preoxygenation: Pre-oxygenation with 100% oxygen Intubation Type: IV induction LMA: LMA inserted LMA Size: 4.0 Number of attempts: 1 Placement Confirmation: positive ETCO2 Tube secured with: Tape Dental Injury: Teeth and Oropharynx as per pre-operative assessment

## 2016-07-25 NOTE — Transfer of Care (Signed)
Immediate Anesthesia Transfer of Care Note  Patient: Derek Cox  Procedure(s) Performed: Procedure(s): CYSTOSCOPY WITH INSERTION OF UROLIFT x4 (N/A)  Patient Location: PACU  Anesthesia Type:General  Level of Consciousness: awake, alert  and oriented  Airway & Oxygen Therapy: Patient Spontanous Breathing and Patient connected to face mask oxygen  Post-op Assessment: Report given to RN and Post -op Vital signs reviewed and stable  Post vital signs: Reviewed and stable  Last Vitals: There were no vitals filed for this visit.  Last Pain: There were no vitals filed for this visit.    Patients Stated Pain Goal: 4 (09/81/19 1478)  Complications: No apparent anesthesia complications

## 2016-07-26 ENCOUNTER — Encounter (HOSPITAL_COMMUNITY): Payer: Self-pay | Admitting: Urology

## 2016-07-29 DIAGNOSIS — R300 Dysuria: Secondary | ICD-10-CM | POA: Diagnosis not present

## 2016-07-29 DIAGNOSIS — R351 Nocturia: Secondary | ICD-10-CM | POA: Diagnosis not present

## 2016-09-13 ENCOUNTER — Encounter: Payer: Self-pay | Admitting: Family Medicine

## 2016-10-15 ENCOUNTER — Other Ambulatory Visit: Payer: Self-pay | Admitting: Family Medicine

## 2016-10-17 ENCOUNTER — Other Ambulatory Visit: Payer: Self-pay

## 2016-10-17 NOTE — Telephone Encounter (Signed)
Call in #[phone] with 5 rf 

## 2016-11-23 ENCOUNTER — Other Ambulatory Visit: Payer: Self-pay | Admitting: Family Medicine

## 2016-12-15 ENCOUNTER — Encounter: Payer: Self-pay | Admitting: Family Medicine

## 2016-12-30 DIAGNOSIS — N401 Enlarged prostate with lower urinary tract symptoms: Secondary | ICD-10-CM | POA: Diagnosis not present

## 2016-12-30 DIAGNOSIS — R350 Frequency of micturition: Secondary | ICD-10-CM | POA: Diagnosis not present

## 2017-01-11 DIAGNOSIS — R351 Nocturia: Secondary | ICD-10-CM | POA: Diagnosis not present

## 2017-01-11 DIAGNOSIS — N401 Enlarged prostate with lower urinary tract symptoms: Secondary | ICD-10-CM | POA: Diagnosis not present

## 2017-01-11 DIAGNOSIS — R3912 Poor urinary stream: Secondary | ICD-10-CM | POA: Diagnosis not present

## 2017-01-27 DIAGNOSIS — N401 Enlarged prostate with lower urinary tract symptoms: Secondary | ICD-10-CM | POA: Diagnosis not present

## 2017-01-27 DIAGNOSIS — R3912 Poor urinary stream: Secondary | ICD-10-CM | POA: Diagnosis not present

## 2017-01-27 DIAGNOSIS — R351 Nocturia: Secondary | ICD-10-CM | POA: Diagnosis not present

## 2017-02-08 ENCOUNTER — Telehealth: Payer: Self-pay | Admitting: Family Medicine

## 2017-02-08 DIAGNOSIS — M9903 Segmental and somatic dysfunction of lumbar region: Secondary | ICD-10-CM | POA: Diagnosis not present

## 2017-02-08 DIAGNOSIS — M9905 Segmental and somatic dysfunction of pelvic region: Secondary | ICD-10-CM | POA: Diagnosis not present

## 2017-02-08 DIAGNOSIS — M5431 Sciatica, right side: Secondary | ICD-10-CM | POA: Diagnosis not present

## 2017-02-08 DIAGNOSIS — M9902 Segmental and somatic dysfunction of thoracic region: Secondary | ICD-10-CM | POA: Diagnosis not present

## 2017-02-08 NOTE — Telephone Encounter (Signed)
Spoke with the pharmacy and they stated that they will contact the pt to advise refill and copay if needed.

## 2017-02-08 NOTE — Telephone Encounter (Signed)
Copied from [place] [id number]. Topic: Inquiry >> Feb 08, 2017 10:55 AM Scherrie Gerlach wrote: Pt states he threw away his Rx  temazepam (RESTORIL) 30 MG capsule  Just refilled last week.  Thinks it was thown in the garbage. [facility] like a new rx [facility] to [id number] [place], [place] - [address] [phone] (Phone) [phone] (Fax)

## 2017-02-08 NOTE — Telephone Encounter (Signed)
He should still have refills. Please tel the pharmacy it is okay for an early refill

## 2017-02-15 DIAGNOSIS — M6281 Muscle weakness (generalized): Secondary | ICD-10-CM | POA: Diagnosis not present

## 2017-02-15 DIAGNOSIS — R3912 Poor urinary stream: Secondary | ICD-10-CM | POA: Diagnosis not present

## 2017-02-15 DIAGNOSIS — M62838 Other muscle spasm: Secondary | ICD-10-CM | POA: Diagnosis not present

## 2017-02-15 DIAGNOSIS — R3914 Feeling of incomplete bladder emptying: Secondary | ICD-10-CM | POA: Diagnosis not present

## 2017-02-15 DIAGNOSIS — M6289 Other specified disorders of muscle: Secondary | ICD-10-CM | POA: Diagnosis not present

## 2017-02-15 DIAGNOSIS — R3911 Hesitancy of micturition: Secondary | ICD-10-CM | POA: Diagnosis not present

## 2017-02-17 DIAGNOSIS — M47816 Spondylosis without myelopathy or radiculopathy, lumbar region: Secondary | ICD-10-CM | POA: Diagnosis not present

## 2017-02-20 DIAGNOSIS — R3912 Poor urinary stream: Secondary | ICD-10-CM | POA: Diagnosis not present

## 2017-02-20 DIAGNOSIS — R3911 Hesitancy of micturition: Secondary | ICD-10-CM | POA: Diagnosis not present

## 2017-02-20 DIAGNOSIS — M6281 Muscle weakness (generalized): Secondary | ICD-10-CM | POA: Diagnosis not present

## 2017-02-20 DIAGNOSIS — R3914 Feeling of incomplete bladder emptying: Secondary | ICD-10-CM | POA: Diagnosis not present

## 2017-02-20 DIAGNOSIS — M62838 Other muscle spasm: Secondary | ICD-10-CM | POA: Diagnosis not present

## 2017-02-21 DIAGNOSIS — M5431 Sciatica, right side: Secondary | ICD-10-CM | POA: Diagnosis not present

## 2017-02-21 DIAGNOSIS — M9905 Segmental and somatic dysfunction of pelvic region: Secondary | ICD-10-CM | POA: Diagnosis not present

## 2017-02-21 DIAGNOSIS — M9902 Segmental and somatic dysfunction of thoracic region: Secondary | ICD-10-CM | POA: Diagnosis not present

## 2017-02-21 DIAGNOSIS — M9903 Segmental and somatic dysfunction of lumbar region: Secondary | ICD-10-CM | POA: Diagnosis not present

## 2017-02-27 DIAGNOSIS — M9903 Segmental and somatic dysfunction of lumbar region: Secondary | ICD-10-CM | POA: Diagnosis not present

## 2017-02-27 DIAGNOSIS — M5431 Sciatica, right side: Secondary | ICD-10-CM | POA: Diagnosis not present

## 2017-02-27 DIAGNOSIS — M9902 Segmental and somatic dysfunction of thoracic region: Secondary | ICD-10-CM | POA: Diagnosis not present

## 2017-02-27 DIAGNOSIS — M9905 Segmental and somatic dysfunction of pelvic region: Secondary | ICD-10-CM | POA: Diagnosis not present

## 2017-03-02 DIAGNOSIS — M9902 Segmental and somatic dysfunction of thoracic region: Secondary | ICD-10-CM | POA: Diagnosis not present

## 2017-03-02 DIAGNOSIS — M5431 Sciatica, right side: Secondary | ICD-10-CM | POA: Diagnosis not present

## 2017-03-02 DIAGNOSIS — M9905 Segmental and somatic dysfunction of pelvic region: Secondary | ICD-10-CM | POA: Diagnosis not present

## 2017-03-02 DIAGNOSIS — M9903 Segmental and somatic dysfunction of lumbar region: Secondary | ICD-10-CM | POA: Diagnosis not present

## 2017-03-03 DIAGNOSIS — M9903 Segmental and somatic dysfunction of lumbar region: Secondary | ICD-10-CM | POA: Diagnosis not present

## 2017-03-03 DIAGNOSIS — M5431 Sciatica, right side: Secondary | ICD-10-CM | POA: Diagnosis not present

## 2017-03-03 DIAGNOSIS — M9905 Segmental and somatic dysfunction of pelvic region: Secondary | ICD-10-CM | POA: Diagnosis not present

## 2017-03-03 DIAGNOSIS — M9902 Segmental and somatic dysfunction of thoracic region: Secondary | ICD-10-CM | POA: Diagnosis not present

## 2017-03-09 DIAGNOSIS — M5431 Sciatica, right side: Secondary | ICD-10-CM | POA: Diagnosis not present

## 2017-03-09 DIAGNOSIS — M9905 Segmental and somatic dysfunction of pelvic region: Secondary | ICD-10-CM | POA: Diagnosis not present

## 2017-03-09 DIAGNOSIS — M9902 Segmental and somatic dysfunction of thoracic region: Secondary | ICD-10-CM | POA: Diagnosis not present

## 2017-03-09 DIAGNOSIS — M9903 Segmental and somatic dysfunction of lumbar region: Secondary | ICD-10-CM | POA: Diagnosis not present

## 2017-03-10 DIAGNOSIS — M9905 Segmental and somatic dysfunction of pelvic region: Secondary | ICD-10-CM | POA: Diagnosis not present

## 2017-03-10 DIAGNOSIS — M9902 Segmental and somatic dysfunction of thoracic region: Secondary | ICD-10-CM | POA: Diagnosis not present

## 2017-03-10 DIAGNOSIS — M5431 Sciatica, right side: Secondary | ICD-10-CM | POA: Diagnosis not present

## 2017-03-10 DIAGNOSIS — M9903 Segmental and somatic dysfunction of lumbar region: Secondary | ICD-10-CM | POA: Diagnosis not present

## 2017-03-14 DIAGNOSIS — M9903 Segmental and somatic dysfunction of lumbar region: Secondary | ICD-10-CM | POA: Diagnosis not present

## 2017-03-14 DIAGNOSIS — M9905 Segmental and somatic dysfunction of pelvic region: Secondary | ICD-10-CM | POA: Diagnosis not present

## 2017-03-14 DIAGNOSIS — M5431 Sciatica, right side: Secondary | ICD-10-CM | POA: Diagnosis not present

## 2017-03-14 DIAGNOSIS — M9902 Segmental and somatic dysfunction of thoracic region: Secondary | ICD-10-CM | POA: Diagnosis not present

## 2017-03-17 DIAGNOSIS — N401 Enlarged prostate with lower urinary tract symptoms: Secondary | ICD-10-CM | POA: Diagnosis not present

## 2017-03-17 DIAGNOSIS — M5440 Lumbago with sciatica, unspecified side: Secondary | ICD-10-CM | POA: Diagnosis not present

## 2017-03-17 DIAGNOSIS — R3914 Feeling of incomplete bladder emptying: Secondary | ICD-10-CM | POA: Diagnosis not present

## 2017-03-20 ENCOUNTER — Other Ambulatory Visit: Payer: Self-pay

## 2017-03-20 MED ORDER — LISINOPRIL 20 MG PO TABS: 20.0000 mg | ORAL_TABLET | Freq: Every day | ORAL | 0 refills | Status: DC

## 2017-03-20 MED ORDER — ROSUVASTATIN CALCIUM 5 MG PO TABS: 5.0000 mg | ORAL_TABLET | Freq: Every day | ORAL | 0 refills | Status: DC

## 2017-04-06 ENCOUNTER — Telehealth: Payer: Self-pay

## 2017-04-06 NOTE — Telephone Encounter (Signed)
Call in 15 mg to take 2 caps qhs, #60 with 5 rf

## 2017-04-06 NOTE — Telephone Encounter (Signed)
Temazepam 30 mg cap  Product on backorder: the only strength of this medication available is 15 mg cap, please advise.

## 2017-04-07 MED ORDER — TEMAZEPAM 15 MG PO CAPS: 30.0000 mg | ORAL_CAPSULE | Freq: Every evening | ORAL | 5 refills | Status: DC | PRN

## 2017-04-07 NOTE — Telephone Encounter (Signed)
Rx has been sent  

## 2017-04-12 ENCOUNTER — Ambulatory Visit (INDEPENDENT_AMBULATORY_CARE_PROVIDER_SITE_OTHER): Payer: Medicare Other | Admitting: Family Medicine

## 2017-04-12 ENCOUNTER — Encounter: Payer: Self-pay | Admitting: Family Medicine

## 2017-04-12 VITALS — BP 130/80 | HR 81 | Temp 97.4°F | Ht 68.0 in | Wt 172.6 lb

## 2017-04-12 DIAGNOSIS — G4700 Insomnia, unspecified: Secondary | ICD-10-CM | POA: Diagnosis not present

## 2017-04-12 DIAGNOSIS — I10 Essential (primary) hypertension: Secondary | ICD-10-CM

## 2017-04-12 DIAGNOSIS — N401 Enlarged prostate with lower urinary tract symptoms: Secondary | ICD-10-CM | POA: Diagnosis not present

## 2017-04-12 DIAGNOSIS — E782 Mixed hyperlipidemia: Secondary | ICD-10-CM | POA: Diagnosis not present

## 2017-04-12 DIAGNOSIS — N138 Other obstructive and reflux uropathy: Secondary | ICD-10-CM | POA: Diagnosis not present

## 2017-04-12 DIAGNOSIS — G8929 Other chronic pain: Secondary | ICD-10-CM

## 2017-04-12 DIAGNOSIS — M545 Low back pain, unspecified: Secondary | ICD-10-CM | POA: Insufficient documentation

## 2017-04-12 LAB — POC URINALSYSI DIPSTICK (AUTOMATED)
Bilirubin, UA: NEGATIVE
Blood, UA: NEGATIVE
GLUCOSE UA: NEGATIVE
KETONES UA: NEGATIVE
LEUKOCYTES UA: NEGATIVE
NITRITE UA: NEGATIVE
Protein, UA: NEGATIVE
Spec Grav, UA: 1.015 (ref 1.010–1.025)
Urobilinogen, UA: 0.2 E.U./dL
pH, UA: 6 (ref 5.0–8.0)

## 2017-04-12 LAB — CBC WITH DIFFERENTIAL/PLATELET
BASOS PCT: 0.9 % (ref 0.0–3.0)
Basophils Absolute: 0 10*3/uL (ref 0.0–0.1)
EOS PCT: 5 % (ref 0.0–5.0)
Eosinophils Absolute: 0.3 10*3/uL (ref 0.0–0.7)
HEMATOCRIT: 46.9 % (ref 39.0–52.0)
HEMOGLOBIN: 15.7 g/dL (ref 13.0–17.0)
LYMPHS PCT: 25.7 % (ref 12.0–46.0)
Lymphs Abs: 1.3 10*3/uL (ref 0.7–4.0)
MCHC: 33.5 g/dL (ref 30.0–36.0)
MCV: 92 fl (ref 78.0–100.0)
MONOS PCT: 9.8 % (ref 3.0–12.0)
Monocytes Absolute: 0.5 10*3/uL (ref 0.1–1.0)
Neutro Abs: 2.9 10*3/uL (ref 1.4–7.7)
Neutrophils Relative %: 58.6 % (ref 43.0–77.0)
Platelets: 231 10*3/uL (ref 150.0–400.0)
RBC: 5.1 Mil/uL (ref 4.22–5.81)
RDW: 12.5 % (ref 11.5–15.5)
WBC: 5 10*3/uL (ref 4.0–10.5)

## 2017-04-12 LAB — LIPID PANEL
CHOL/HDL RATIO: 3
CHOLESTEROL: 200 mg/dL (ref 0–200)
HDL: 58.4 mg/dL (ref >39.00)
LDL Cholesterol: 116 mg/dL — ABNORMAL HIGH (ref 0–99)
NonHDL: 141.49
TRIGLYCERIDES: 126 mg/dL (ref 0.0–149.0)
VLDL: 25.2 mg/dL (ref 0.0–40.0)

## 2017-04-12 LAB — TSH: TSH: 2.04 u[IU]/mL (ref 0.35–4.50)

## 2017-04-12 LAB — HEPATIC FUNCTION PANEL
ALK PHOS: 63 U/L (ref 39–117)
ALT: 16 U/L (ref 0–53)
AST: 19 U/L (ref 0–37)
Albumin: 4.4 g/dL (ref 3.5–5.2)
BILIRUBIN TOTAL: 0.6 mg/dL (ref 0.2–1.2)
Bilirubin, Direct: 0.1 mg/dL (ref 0.0–0.3)
Total Protein: 6.8 g/dL (ref 6.0–8.3)

## 2017-04-12 LAB — BASIC METABOLIC PANEL
BUN: 20 mg/dL (ref 6–23)
CHLORIDE: 104 meq/L (ref 96–112)
CO2: 32 mEq/L (ref 19–32)
Calcium: 9.2 mg/dL (ref 8.4–10.5)
Creatinine, Ser: 1.15 mg/dL (ref 0.40–1.50)
GFR: 66.22 mL/min (ref >60.00)
GLUCOSE: 89 mg/dL (ref 70–99)
POTASSIUM: 4.4 meq/L (ref 3.5–5.1)
SODIUM: 142 meq/L (ref 135–145)

## 2017-04-12 LAB — PSA: PSA: 2.43 ng/mL (ref 0.10–4.00)

## 2017-04-12 MED ORDER — TEMAZEPAM 15 MG PO CAPS: 15.0000 mg | ORAL_CAPSULE | Freq: Every evening | ORAL | 1 refills | Status: DC | PRN

## 2017-04-12 MED ORDER — LISINOPRIL 20 MG PO TABS: 20.0000 mg | ORAL_TABLET | Freq: Every day | ORAL | 3 refills | Status: DC

## 2017-04-12 MED ORDER — ROSUVASTATIN CALCIUM 5 MG PO TABS: 5.0000 mg | ORAL_TABLET | Freq: Every day | ORAL | 3 refills | Status: DC

## 2017-04-12 MED ORDER — LEVETIRACETAM 500 MG PO TABS: 500.0000 mg | ORAL_TABLET | Freq: Two times a day (BID) | ORAL | 3 refills | Status: DC

## 2017-04-12 MED ORDER — DICLOFENAC SODIUM 75 MG PO TBEC: 75.0000 mg | DELAYED_RELEASE_TABLET | Freq: Two times a day (BID) | ORAL | 1 refills | Status: DC

## 2017-04-12 NOTE — Progress Notes (Signed)
   Subjective:    Patient ID: Derek Cox, male    DOB: 1946/03/27, 72 y.o.   MRN: [id number]  HPI Here to follow up on issues. He has felt well in general but he mentions some low back pain that started about 6 months ago. He has tried Ibuprofen with partial relief. He saw his chiropractor a number of times but manipulations did not help. He saw [facility] and was referred to Dr. Samella Parr. An Xray of the lumbar spine showed mild disc disease but primarily showed facet arthropathy. He was given a short course of oral prednisone and this helped quite well. However the pain returned when this wore off. Dr. Nelva Bush has discussed the possibility of steroid injections but they have not tried these yet. He saw Dr. Diona Fanti for BPH issues and he had a Urolift procedure in April. This has given him mixed results.    Review of Systems  Constitutional: Negative.   HENT: Negative.   Eyes: Negative.   Respiratory: Negative.   Cardiovascular: Negative.   Gastrointestinal: Negative.   Genitourinary: Positive for difficulty urinating. Negative for dysuria, flank pain, frequency, hematuria, testicular pain and urgency.  Musculoskeletal: Positive for back pain. Negative for arthralgias, gait problem, joint swelling, myalgias, neck pain and neck stiffness.  Skin: Negative.   Neurological: Negative.   Psychiatric/Behavioral: Negative.        Objective:   Physical Exam  Constitutional: He is oriented to person, place, and time. He appears well-developed and well-nourished. No distress.  HENT:  Head: Normocephalic and atraumatic.  Right Ear: External ear normal.  Left Ear: External ear normal.  Nose: Nose normal.  Mouth/Throat: Oropharynx is clear and moist. No oropharyngeal exudate.  Eyes: Conjunctivae and EOM are normal. Pupils are equal, round, and reactive to light. Right eye exhibits no discharge. Left eye exhibits no discharge. No scleral icterus.  Neck: Neck supple. No JVD  present. No tracheal deviation present. No thyromegaly present.  Cardiovascular: Normal rate, regular rhythm, normal heart sounds and intact distal pulses. Exam reveals no gallop and no friction rub.  No murmur heard. Pulmonary/Chest: Effort normal and breath sounds normal. No respiratory distress. He has no wheezes. He has no rales. He exhibits no tenderness.  Abdominal: Soft. Bowel sounds are normal. He exhibits no distension and no mass. There is no tenderness. There is no rebound and no guarding.  Musculoskeletal: Normal range of motion. He exhibits no edema or tenderness.  Lymphadenopathy:    He has no cervical adenopathy.  Neurological: He is alert and oriented to person, place, and time. He has normal reflexes. No cranial nerve deficit. He exhibits normal muscle tone. Coordination normal.  Skin: Skin is warm and dry. No rash noted. He is not diaphoretic. No erythema. No pallor.  Psychiatric: He has a normal mood and affect. His behavior is normal. Judgment and thought content normal.          Assessment & Plan:  His HTN is stable. He is S/P a stroke and is doing well. He will follow up with Urology for BPH issues. For the low back pain he will try Diclofenac 75 mg bid. He sleeps well with Temazepam. He is due for a colonoscopy but he is hesitant to do this because the anaesthesia always causes urinary obstruction and he has to have a catheter placed. We agreed to do a Cologuard test instead.  Alysia Penna, MD

## 2017-04-26 ENCOUNTER — Telehealth: Payer: Self-pay | Admitting: Family Medicine

## 2017-04-26 NOTE — Telephone Encounter (Signed)
Copied from [place] [id number]. Topic: Quick Communication - See Telephone Encounter >> Apr 26, 2017  8:39 AM Boyd Kerbs wrote: CRM for notification. See Telephone encounter for:   Pt. Said wife got her coularguard test but he has not received one..  Can you re send him one.  04/26/17.

## 2017-04-26 NOTE — Telephone Encounter (Signed)
Called and spoke with pt. Pt advised stated that he is not home and will look tomorrow and will give [place] a call if he can not find it.

## 2017-04-26 NOTE — Telephone Encounter (Signed)
[place] at [phone] and spoke with Juliann Pulse. Per their records, Cologuard kit was delivered on 04/26/2017 and placed on the left side of the porch. Please have patient check around his house to make sure the package is not there somewhere. If he never received the package, we can call Cologuard at the number above and request a new test kit be sent. There is no charge to the patient if a new test kit is shipped, the patient is only charged once they actually complete the Cologuard testing.

## 2017-04-26 NOTE — Telephone Encounter (Signed)
Sent to carolyn for assistance   Thanks

## 2017-04-28 ENCOUNTER — Encounter: Payer: Self-pay | Admitting: Family Medicine

## 2017-04-28 DIAGNOSIS — Z1212 Encounter for screening for malignant neoplasm of rectum: Secondary | ICD-10-CM | POA: Diagnosis not present

## 2017-04-28 DIAGNOSIS — Z1211 Encounter for screening for malignant neoplasm of colon: Secondary | ICD-10-CM | POA: Diagnosis not present

## 2017-04-28 LAB — COLOGUARD: COLOGUARD: NEGATIVE

## 2017-06-05 DIAGNOSIS — L111 Transient acantholytic dermatosis [Grover]: Secondary | ICD-10-CM | POA: Diagnosis not present

## 2017-06-05 DIAGNOSIS — Z85828 Personal history of other malignant neoplasm of skin: Secondary | ICD-10-CM | POA: Diagnosis not present

## 2017-06-05 DIAGNOSIS — Z23 Encounter for immunization: Secondary | ICD-10-CM | POA: Diagnosis not present

## 2017-06-05 DIAGNOSIS — D225 Melanocytic nevi of trunk: Secondary | ICD-10-CM | POA: Diagnosis not present

## 2017-06-05 DIAGNOSIS — L814 Other melanin hyperpigmentation: Secondary | ICD-10-CM | POA: Diagnosis not present

## 2017-06-05 DIAGNOSIS — D1801 Hemangioma of skin and subcutaneous tissue: Secondary | ICD-10-CM | POA: Diagnosis not present

## 2017-06-05 DIAGNOSIS — L821 Other seborrheic keratosis: Secondary | ICD-10-CM | POA: Diagnosis not present

## 2017-06-05 DIAGNOSIS — L570 Actinic keratosis: Secondary | ICD-10-CM | POA: Diagnosis not present

## 2017-06-19 ENCOUNTER — Telehealth: Payer: Self-pay | Admitting: Family Medicine

## 2017-06-19 NOTE — Telephone Encounter (Signed)
Copied from [place] [id number]. Topic: General - Other >> Jun 19, 2017  4:08 PM Carolyn Stare wrote:  Pt need to have a new RX written that say take 2 15 mg at bedtime     30 day supply   [facility]

## 2017-06-20 NOTE — Telephone Encounter (Signed)
This needs to be changed for pt's temazepam Rx sent to PCP for approval

## 2017-06-21 MED ORDER — TEMAZEPAM 15 MG PO CAPS: 30.0000 mg | ORAL_CAPSULE | Freq: Every evening | ORAL | 5 refills | Status: DC | PRN

## 2017-06-21 NOTE — Telephone Encounter (Signed)
Called in Rx for pt. Called and spoke with pt. Pt advised Rx has been called into pharmacy.

## 2017-06-21 NOTE — Telephone Encounter (Signed)
Call in Temazepam 15 mg to take 2 tabs qhs, #60 with 5 rf

## 2017-06-22 DIAGNOSIS — M5136 Other intervertebral disc degeneration, lumbar region: Secondary | ICD-10-CM | POA: Diagnosis not present

## 2017-06-22 DIAGNOSIS — M51369 Other intervertebral disc degeneration, lumbar region without mention of lumbar back pain or lower extremity pain: Secondary | ICD-10-CM | POA: Insufficient documentation

## 2017-08-17 ENCOUNTER — Other Ambulatory Visit: Payer: Self-pay | Admitting: Family Medicine

## 2017-08-17 DIAGNOSIS — M5136 Other intervertebral disc degeneration, lumbar region: Secondary | ICD-10-CM | POA: Diagnosis not present

## 2017-08-17 NOTE — Telephone Encounter (Signed)
Last OV 04/12/2017   Last refilled 04/12/2017 disp 90 with 1   Sent to PCP for approval

## 2017-09-11 ENCOUNTER — Telehealth: Payer: Self-pay | Admitting: Family Medicine

## 2017-09-11 MED ORDER — SILDENAFIL CITRATE 20 MG PO TABS: 40.0000 mg | ORAL_TABLET | ORAL | 11 refills | Status: DC | PRN

## 2017-09-11 NOTE — Telephone Encounter (Signed)
Done

## 2017-09-13 ENCOUNTER — Encounter: Payer: Self-pay | Admitting: *Deleted

## 2017-09-13 DIAGNOSIS — N529 Male erectile dysfunction, unspecified: Secondary | ICD-10-CM

## 2017-09-13 HISTORY — DX: Male erectile dysfunction, unspecified: N52.9

## 2017-09-13 NOTE — Telephone Encounter (Signed)
PA for sildenafil 20 mg tablets initiated via CoverMyMeds. Awaiting determination. Key: Sandra Cockayne

## 2017-09-14 NOTE — Telephone Encounter (Signed)
Called pt and left a VM to call back. CRM created. Sent to [facility] pool.

## 2017-09-14 NOTE — Telephone Encounter (Signed)
PA denied for the following reason:   The Medicare rule in the Prescription Drug Manual (Chapter 6, Section 20.1) says drugs used for the treatment and maintenance of erectile dysfunction (ED) are excluded from Medicare Part D coverage. [employer] follows Medicare rules. Your drug is being used for the inability to develop or maintain an erection during sexual activity and per Medicare rules is not covered.

## 2017-09-14 NOTE — Telephone Encounter (Signed)
Please tell him he will need to pay cash for this. He may want to shop around for the best prices

## 2017-09-15 MED ORDER — SILDENAFIL CITRATE 20 MG PO TABS: 40.0000 mg | ORAL_TABLET | ORAL | 11 refills | Status: DC | PRN

## 2017-09-15 NOTE — Telephone Encounter (Signed)
Pt notified of results/instructions and verbalized understanding. He would like hard copy rx to price shop. Rx printed and placed at front desk for signature.

## 2017-09-15 NOTE — Addendum Note (Signed)
Addended by: Dorrene German on: 09/15/2017 04:39 PM   Modules accepted: Orders

## 2017-09-15 NOTE — Telephone Encounter (Signed)
Called pt and left a VM to call back.  

## 2017-10-18 DIAGNOSIS — R3911 Hesitancy of micturition: Secondary | ICD-10-CM | POA: Diagnosis not present

## 2017-10-18 DIAGNOSIS — N401 Enlarged prostate with lower urinary tract symptoms: Secondary | ICD-10-CM | POA: Diagnosis not present

## 2017-10-18 DIAGNOSIS — R3912 Poor urinary stream: Secondary | ICD-10-CM | POA: Diagnosis not present

## 2017-12-06 DIAGNOSIS — L570 Actinic keratosis: Secondary | ICD-10-CM | POA: Diagnosis not present

## 2017-12-06 DIAGNOSIS — L820 Inflamed seborrheic keratosis: Secondary | ICD-10-CM | POA: Diagnosis not present

## 2017-12-25 ENCOUNTER — Other Ambulatory Visit: Payer: Self-pay | Admitting: Family Medicine

## 2018-02-09 ENCOUNTER — Other Ambulatory Visit: Payer: Self-pay

## 2018-03-19 ENCOUNTER — Telehealth: Payer: Self-pay | Admitting: *Deleted

## 2018-03-19 NOTE — Telephone Encounter (Signed)
Okay to schedule for a shingrix and prevnar?

## 2018-03-19 NOTE — Telephone Encounter (Signed)
Yes okay to both

## 2018-03-19 NOTE — Telephone Encounter (Signed)
Patient is aware 

## 2018-03-19 NOTE — Telephone Encounter (Signed)
shingrix can be given at the pharmacy.  Okay for prevnar?

## 2018-03-19 NOTE — Telephone Encounter (Signed)
Copied from [place] [id number]. Topic: Appointment Scheduling - Scheduling Inquiry for Clinic >> Mar 19, 2018 10:44 AM Bea Graff, NT wrote: Reason for CRM: Pts wife states that many years ago they had the shingles and pneumonia vaccine but would like to come in and have this updated. Please advise.

## 2018-04-13 ENCOUNTER — Telehealth: Payer: Self-pay

## 2018-04-13 NOTE — Telephone Encounter (Signed)
Author phoned pt. to offer initial [facility] appointment. Pt. stated he has had them in the past and is not interested in doing the visits.

## 2018-04-16 ENCOUNTER — Ambulatory Visit (INDEPENDENT_AMBULATORY_CARE_PROVIDER_SITE_OTHER): Payer: Medicare Other | Admitting: Family Medicine

## 2018-04-16 ENCOUNTER — Encounter: Payer: Self-pay | Admitting: Family Medicine

## 2018-04-16 VITALS — BP 110/78 | HR 67 | Temp 97.5°F | Ht 69.0 in | Wt 176.5 lb

## 2018-04-16 DIAGNOSIS — M545 Low back pain, unspecified: Secondary | ICD-10-CM

## 2018-04-16 DIAGNOSIS — Z8669 Personal history of other diseases of the nervous system and sense organs: Secondary | ICD-10-CM

## 2018-04-16 DIAGNOSIS — E782 Mixed hyperlipidemia: Secondary | ICD-10-CM

## 2018-04-16 DIAGNOSIS — G8929 Other chronic pain: Secondary | ICD-10-CM | POA: Diagnosis not present

## 2018-04-16 DIAGNOSIS — Z23 Encounter for immunization: Secondary | ICD-10-CM | POA: Diagnosis not present

## 2018-04-16 DIAGNOSIS — G4700 Insomnia, unspecified: Secondary | ICD-10-CM

## 2018-04-16 DIAGNOSIS — I10 Essential (primary) hypertension: Secondary | ICD-10-CM

## 2018-04-16 DIAGNOSIS — N138 Other obstructive and reflux uropathy: Secondary | ICD-10-CM

## 2018-04-16 DIAGNOSIS — N401 Enlarged prostate with lower urinary tract symptoms: Secondary | ICD-10-CM | POA: Diagnosis not present

## 2018-04-16 DIAGNOSIS — Z8673 Personal history of transient ischemic attack (TIA), and cerebral infarction without residual deficits: Secondary | ICD-10-CM

## 2018-04-16 LAB — LIPID PANEL
CHOL/HDL RATIO: 3
Cholesterol: 207 mg/dL — ABNORMAL HIGH (ref 0–200)
HDL: 65.5 mg/dL (ref >39.00)
LDL CALC: 111 mg/dL — AB (ref 0–99)
NonHDL: 141.04
Triglycerides: 152 mg/dL — ABNORMAL HIGH (ref 0.0–149.0)
VLDL: 30.4 mg/dL (ref 0.0–40.0)

## 2018-04-16 LAB — POC URINALSYSI DIPSTICK (AUTOMATED)
BILIRUBIN UA: NEGATIVE
Blood, UA: NEGATIVE
GLUCOSE UA: NEGATIVE
Ketones, UA: NEGATIVE
LEUKOCYTES UA: NEGATIVE
NITRITE UA: NEGATIVE
Protein, UA: NEGATIVE
Spec Grav, UA: 1.015 (ref 1.010–1.025)
Urobilinogen, UA: 0.2 E.U./dL
pH, UA: 7 (ref 5.0–8.0)

## 2018-04-16 LAB — HEPATIC FUNCTION PANEL
ALK PHOS: 63 U/L (ref 39–117)
ALT: 14 U/L (ref 0–53)
AST: 20 U/L (ref 0–37)
Albumin: 4.7 g/dL (ref 3.5–5.2)
BILIRUBIN DIRECT: 0.1 mg/dL (ref 0.0–0.3)
BILIRUBIN TOTAL: 0.7 mg/dL (ref 0.2–1.2)
Total Protein: 7 g/dL (ref 6.0–8.3)

## 2018-04-16 LAB — CBC WITH DIFFERENTIAL/PLATELET
BASOS PCT: 0.8 % (ref 0.0–3.0)
Basophils Absolute: 0 10*3/uL (ref 0.0–0.1)
EOS ABS: 0.3 10*3/uL (ref 0.0–0.7)
EOS PCT: 5.8 % — AB (ref 0.0–5.0)
HEMATOCRIT: 48.9 % (ref 39.0–52.0)
HEMOGLOBIN: 16.8 g/dL (ref 13.0–17.0)
LYMPHS PCT: 23.1 % (ref 12.0–46.0)
Lymphs Abs: 1.3 10*3/uL (ref 0.7–4.0)
MCHC: 34.3 g/dL (ref 30.0–36.0)
MCV: 91.1 fl (ref 78.0–100.0)
MONOS PCT: 7.8 % (ref 3.0–12.0)
Monocytes Absolute: 0.4 10*3/uL (ref 0.1–1.0)
NEUTROS ABS: 3.6 10*3/uL (ref 1.4–7.7)
Neutrophils Relative %: 62.5 % (ref 43.0–77.0)
PLATELETS: 215 10*3/uL (ref 150.0–400.0)
RBC: 5.36 Mil/uL (ref 4.22–5.81)
RDW: 13 % (ref 11.5–15.5)
WBC: 5.7 10*3/uL (ref 4.0–10.5)

## 2018-04-16 LAB — BASIC METABOLIC PANEL
BUN: 12 mg/dL (ref 6–23)
CHLORIDE: 104 meq/L (ref 96–112)
CO2: 30 mEq/L (ref 19–32)
Calcium: 9.8 mg/dL (ref 8.4–10.5)
Creatinine, Ser: 1.22 mg/dL (ref 0.40–1.50)
GFR: 58.04 mL/min — AB (ref >60.00)
Glucose, Bld: 91 mg/dL (ref 70–99)
Potassium: 4.5 mEq/L (ref 3.5–5.1)
SODIUM: 143 meq/L (ref 135–145)

## 2018-04-16 LAB — TSH: TSH: 2.5 u[IU]/mL (ref 0.35–4.50)

## 2018-04-16 LAB — PSA: PSA: 1.78 ng/mL (ref 0.10–4.00)

## 2018-04-16 MED ORDER — ROSUVASTATIN CALCIUM 5 MG PO TABS: 5.0000 mg | ORAL_TABLET | Freq: Every day | ORAL | 3 refills | Status: DC

## 2018-04-16 MED ORDER — TRIAMCINOLONE ACETONIDE 0.1 % EX CREA
1.0000 | TOPICAL_CREAM | Freq: Two times a day (BID) | CUTANEOUS | 2 refills | Status: DC
Start: 2018-04-16 — End: 2019-10-23

## 2018-04-16 MED ORDER — LEVETIRACETAM 500 MG PO TABS: 500.0000 mg | ORAL_TABLET | Freq: Two times a day (BID) | ORAL | 3 refills | Status: DC

## 2018-04-16 MED ORDER — LISINOPRIL 20 MG PO TABS: 20.0000 mg | ORAL_TABLET | Freq: Every day | ORAL | 3 refills | Status: DC

## 2018-04-16 NOTE — Progress Notes (Signed)
Subjective:    Patient ID: Derek Cox, male    DOB: 20-Jul-1945, 73 y.o.   MRN: [id number]  HPI Here to follow up on issues. He feels well in general. His BP is stable. He sleeps well using Xanax. He sees Urology yearly for exams.    Review of Systems  Constitutional: Negative.   HENT: Negative.   Eyes: Negative.   Respiratory: Negative.   Cardiovascular: Negative.   Gastrointestinal: Negative.   Genitourinary: Negative.   Musculoskeletal: Negative.   Skin: Negative.   Neurological: Negative.   Psychiatric/Behavioral: Negative.        Objective:   Physical Exam Constitutional:      General: He is not in acute distress.    Appearance: He is well-developed. He is not diaphoretic.  HENT:     Head: Normocephalic and atraumatic.     Right Ear: External ear normal.     Left Ear: External ear normal.     Nose: Nose normal.     Mouth/Throat:     Pharynx: No oropharyngeal exudate.  Eyes:     General: No scleral icterus.       Right eye: No discharge.        Left eye: No discharge.     Conjunctiva/sclera: Conjunctivae normal.     Pupils: Pupils are equal, round, and reactive to light.  Neck:     Musculoskeletal: Neck supple.     Thyroid: No thyromegaly.     Vascular: No JVD.     Trachea: No tracheal deviation.  Cardiovascular:     Rate and Rhythm: Normal rate and regular rhythm.     Heart sounds: Normal heart sounds. No murmur. No friction rub. No gallop.   Pulmonary:     Effort: Pulmonary effort is normal. No respiratory distress.     Breath sounds: Normal breath sounds. No wheezing or rales.  Chest:     Chest wall: No tenderness.  Abdominal:     General: Bowel sounds are normal. There is no distension.     Palpations: Abdomen is soft. There is no mass.     Tenderness: There is no abdominal tenderness. There is no guarding or rebound.  Genitourinary:    Penis: Normal. No tenderness.      Prostate: Normal.     Rectum: Normal. Guaiac result negative.    Musculoskeletal: Normal range of motion.        General: No tenderness.  Lymphadenopathy:     Cervical: No cervical adenopathy.  Skin:    General: Skin is warm and dry.     Coloration: Skin is not pale.     Findings: No erythema or rash.  Neurological:     Mental Status: He is alert and oriented to person, place, and time.     Cranial Nerves: No cranial nerve deficit.     Motor: No abnormal muscle tone.     Coordination: Coordination normal.     Deep Tendon Reflexes: Reflexes are normal and symmetric. Reflexes normal.  Psychiatric:        Behavior: Behavior normal.        Thought Content: Thought content normal.        Judgment: Judgment normal.           Assessment & Plan:  His HTN is stable. The insomnia is stable. We will get fasting labs to check lipids, etc. Given a Prevnar 13 vaccine. He will get the Shingrix series at is pharmacy.  Alysia Penna, MD

## 2018-04-18 ENCOUNTER — Encounter: Payer: Self-pay | Admitting: *Deleted

## 2018-04-26 ENCOUNTER — Telehealth: Payer: Self-pay | Admitting: Family Medicine

## 2018-04-26 NOTE — Telephone Encounter (Signed)
Call in #90 with one rf 

## 2018-04-26 NOTE — Telephone Encounter (Signed)
Received fax from [facility] stating that the pt would like a refill of the alprazolam 0.25 mg tablets.  Dr. Sarajane Jews please advise on this refill. Thanks

## 2018-04-27 MED ORDER — ALPRAZOLAM 0.25 MG PO TABS: 0.2500 mg | ORAL_TABLET | Freq: Two times a day (BID) | ORAL | 1 refills | Status: DC | PRN

## 2018-04-27 NOTE — Telephone Encounter (Signed)
rx has been called to the pharmacy  

## 2018-05-27 ENCOUNTER — Encounter: Payer: Self-pay | Admitting: Family Medicine

## 2018-05-29 NOTE — Telephone Encounter (Signed)
In that case I would not give them Tamiflu.

## 2018-05-29 NOTE — Telephone Encounter (Signed)
Dr. Fry please advise. Thanks  

## 2018-06-04 DIAGNOSIS — L821 Other seborrheic keratosis: Secondary | ICD-10-CM | POA: Diagnosis not present

## 2018-06-04 DIAGNOSIS — Z23 Encounter for immunization: Secondary | ICD-10-CM | POA: Diagnosis not present

## 2018-06-04 DIAGNOSIS — Z85828 Personal history of other malignant neoplasm of skin: Secondary | ICD-10-CM | POA: Diagnosis not present

## 2018-06-04 DIAGNOSIS — D1801 Hemangioma of skin and subcutaneous tissue: Secondary | ICD-10-CM | POA: Diagnosis not present

## 2018-06-04 DIAGNOSIS — L111 Transient acantholytic dermatosis [Grover]: Secondary | ICD-10-CM | POA: Diagnosis not present

## 2018-06-04 DIAGNOSIS — C44519 Basal cell carcinoma of skin of other part of trunk: Secondary | ICD-10-CM | POA: Diagnosis not present

## 2018-06-04 DIAGNOSIS — L570 Actinic keratosis: Secondary | ICD-10-CM | POA: Diagnosis not present

## 2018-06-04 DIAGNOSIS — D225 Melanocytic nevi of trunk: Secondary | ICD-10-CM | POA: Diagnosis not present

## 2018-06-04 DIAGNOSIS — L814 Other melanin hyperpigmentation: Secondary | ICD-10-CM | POA: Diagnosis not present

## 2018-06-04 DIAGNOSIS — D485 Neoplasm of uncertain behavior of skin: Secondary | ICD-10-CM | POA: Diagnosis not present

## 2018-07-09 ENCOUNTER — Encounter: Payer: Self-pay | Admitting: Family Medicine

## 2018-07-09 DIAGNOSIS — M79672 Pain in left foot: Secondary | ICD-10-CM

## 2018-07-10 ENCOUNTER — Ambulatory Visit (INDEPENDENT_AMBULATORY_CARE_PROVIDER_SITE_OTHER): Payer: Medicare Other | Admitting: Family Medicine

## 2018-07-10 ENCOUNTER — Encounter: Payer: Self-pay | Admitting: Family Medicine

## 2018-07-10 ENCOUNTER — Other Ambulatory Visit: Payer: Self-pay

## 2018-07-10 ENCOUNTER — Ambulatory Visit: Payer: Medicare Other | Admitting: Family Medicine

## 2018-07-10 DIAGNOSIS — M109 Gout, unspecified: Secondary | ICD-10-CM

## 2018-07-10 MED ORDER — METHYLPREDNISOLONE 4 MG PO TBPK: ORAL_TABLET | ORAL | 0 refills | Status: DC

## 2018-07-10 NOTE — Progress Notes (Signed)
Subjective:    Patient ID: Derek Cox, male    DOB: [birth year], 73 y.o.   MRN: [id number]  HPI Virtual Visit via Video Note  I connected with the patient on 07/10/18 at  1:30 PM EDT by a video enabled telemedicine application and verified that I am speaking with the correct person using two identifiers.  Location patient: home Location provider:work or home office Persons participating in the virtual visit: patient, provider  I discussed the limitations of evaluation and management by telemedicine and the availability of in person appointments. The patient expressed understanding and agreed to proceed.   HPI: He is complaining of a gout attack that started 2 days ago. It has been several years since he had a bout like this. He has swelling and pain in the left forefoot. No redness or warmth. No recent trauma.    ROS: See pertinent positives and negatives per HPI.  Past Medical History:  Diagnosis Date  . Arthritis    Left hand  . BPH with urinary obstruction    sees Dr. Diona Fanti  . Cancer ([place])    skin cancers removed NON melanoma  . Complication of anesthesia    dysuria  . 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 History of dysuria    post surgical  . Hyperlipidemia   . Hypertension    echo done - 10/10  . Inguinal hernia   . Seizures ([place])    after stroke , upon self d/c'ing Dilantin, since on Keppra, no repeat of seizure   . 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 Stroke [facility])    2010, treated at [facility] for evacuation of [facility], then rehabed at [facility], no residual deficits     Past Surgical History:  Procedure Laterality Date  . ANTERIOR CERVICAL DECOMP/DISCECTOMY FUSION N/A 11/15/2012   Procedure: ANTERIOR CERVICAL DECOMPRESSION/DISCECTOMY FUSION 1 LEVEL,CERVICAL FOUR-FIVE;  Surgeon: Ophelia Charter, MD;  Location: [facility];  Service: Neurosurgery;  Laterality: N/A;  . BRAIN HEMATOMA EVACUATION Left 2010  . BRAIN SURGERY     bleeding on brain  . CERVICAL FUSION  2007   2 surgeries  . COLONOSCOPY  2006   per Dr. Timmothy Euler, clear. Negative Cologuard 04-28-17    . CYSTOSCOPY WITH INSERTION OF UROLIFT N/A 07/25/2016   Procedure: CYSTOSCOPY WITH INSERTION OF UROLIFT x4;  Surgeon: Franchot Gallo, MD;  Location: [facility];  Service: Urology;  Laterality: N/A;  . HERNIA REPAIR Bilateral    inguinal hernia - repair, [facility]- 20 yrs ago    . [place] evacuation  2010  . INGUINAL HERNIA REPAIR  08/11/2011   Procedure: LAPAROSCOPIC INGUINAL HERNIA;  Surgeon: Madilyn Hook, DO;  Location: [facility];  Service: General;  Laterality: Right;  . INGUINAL HERNIA REPAIR    . KNEE SURGERY    . MOLE REMOVAL     non melanoma  . PILONIDAL CYST EXCISION    . TONSILLECTOMY      Family History  Problem Relation Age of Onset  . Stroke Father   . Lung cancer Father   . Anesthesia problems Neg Hx      Current Outpatient Medications:  .  ALPRAZolam (XANAX) 0.25 MG tablet, Take 1 tablet (0.25 mg total) by mouth 2 (two) times daily as needed for anxiety., Disp: 90 tablet, Rfl: 1 .  diclofenac (VOLTAREN) 75 MG EC tablet, TAKE 1 TABLET BY MOUTH TWICE A DAY, Disp: 180 tablet, Rfl: 3 .  levETIRAcetam (KEPPRA) 500 MG tablet, Take 1 tablet (500 mg total) by mouth every 12 (twelve) hours., Disp: 180 tablet, Rfl: 3 .  lisinopril (PRINIVIL,ZESTRIL) 20 MG tablet, Take 1 tablet (20 mg total) by mouth daily., Disp: 90 tablet, Rfl: 3 .  rosuvastatin (CRESTOR) 5 MG tablet, Take 1 tablet (5 mg total) by mouth daily., Disp: 90 tablet, Rfl: 3 .  sildenafil (REVATIO) 20 MG tablet, Take 2 tablets (40 mg total) by mouth as needed., Disp: 20 tablet, Rfl: 11 .  traMADol (ULTRAM) 50 MG tablet, Take 1 tablet (50 mg total) by mouth every 6 (six) hours as needed., Disp: 10 tablet, Rfl: 0 .  triamcinolone cream (KENALOG) 0.1 %, Apply 1 application topically 2 (two) times daily., Disp: 30 g, Rfl: 2 .  Vitamin D, Cholecalciferol, 1000 units CAPS, Take 1,000 Units by mouth 4 (four)  times a week. , Disp: , Rfl:  .  methylPREDNISolone (MEDROL DOSEPAK) 4 MG TBPK tablet, As directed, Disp: 21 tablet, Rfl: 0  EXAM:  VITALS per patient if applicable:  GENERAL: alert, oriented, appears well and in no acute distress  HEENT: atraumatic, conjunttiva clear, no obvious abnormalities on inspection of external nose and ears  NECK: normal movements of the head and neck  LUNGS: on inspection no signs of respiratory distress, breathing rate appears normal, no obvious gross SOB, gasping or wheezing  CV: no obvious cyanosis  MS: moves all visible extremities without noticeable abnormality  PSYCH/NEURO: pleasant and cooperative, no obvious depression or anxiety, speech and thought processing grossly intact  ASSESSMENT AND PLAN: Gout, he will try a Medrol dose pack. Elevate the foot. Recheck prn.  Alysia Penna, MD  Discussed the following assessment and plan:  No diagnosis found.     I discussed the assessment and treatment plan with the patient. The patient was provided an opportunity to ask questions and all were answered. The patient agreed with the plan and demonstrated an understanding of the instructions.   The patient was advised to call back or seek an in-person evaluation if the symptoms worsen or if the condition fails to improve as anticipated.     Review of Systems     Objective:   Physical Exam        Assessment & Plan:

## 2018-07-17 NOTE — Telephone Encounter (Signed)
I put in the order for the foot Xray. He can come in any time to get this done

## 2018-07-17 NOTE — Addendum Note (Signed)
Addended by: Alysia Penna A on: 07/17/2018 04:21 PM   Modules accepted: Orders

## 2018-07-18 NOTE — Telephone Encounter (Signed)
Bring him in today for an in person OV, that way I can examine him and get an Xray as well

## 2018-07-18 NOTE — Telephone Encounter (Signed)
Dr. Fry please advise. Thanks  

## 2018-07-18 NOTE — Telephone Encounter (Signed)
I called the pt and his wife will have him call back to schedule visit for today.

## 2018-07-23 ENCOUNTER — Ambulatory Visit (INDEPENDENT_AMBULATORY_CARE_PROVIDER_SITE_OTHER): Payer: Medicare Other

## 2018-07-23 ENCOUNTER — Other Ambulatory Visit: Payer: Self-pay | Admitting: Family Medicine

## 2018-07-23 DIAGNOSIS — M79671 Pain in right foot: Secondary | ICD-10-CM

## 2018-07-23 DIAGNOSIS — M79672 Pain in left foot: Secondary | ICD-10-CM

## 2018-07-24 ENCOUNTER — Encounter: Payer: Self-pay | Admitting: *Deleted

## 2018-07-24 ENCOUNTER — Encounter: Payer: Self-pay | Admitting: Family Medicine

## 2018-07-24 MED ORDER — TRAMADOL HCL 50 MG PO TABS: ORAL_TABLET | ORAL | 1 refills | Status: DC

## 2018-07-24 MED ORDER — COLCHICINE 0.6 MG PO TABS: 0.6000 mg | ORAL_TABLET | Freq: Two times a day (BID) | ORAL | 2 refills | Status: DC

## 2018-07-24 NOTE — Telephone Encounter (Signed)
Dr. Sarajane Jews please advise. Thanks  Does he need another in office visit?

## 2018-07-24 NOTE — Telephone Encounter (Signed)
He can try Colchicine to get the gout under control and add Tramadol for pain relief. Call in Colchicine 0.6 mg BID, #60 with 2 rf. Also Tramadol 50 mg , take 2 tabs every 6 hours prn pain, #60 with one rf

## 2018-07-25 NOTE — Telephone Encounter (Signed)
I called the pharmacy and this is what they said:  Insurance will NOT cover the generic for this medication but he can get that for a 30 day supply for $162.86  His insurance will cover the name brand but he will end up paying $453 for this for a 90 day supply.    He can use the [facility] and get this between $55-129 for a 30 day supply just depending on where he goes.  What would you like to do Dr. Sarajane Jews thanks

## 2018-07-25 NOTE — Telephone Encounter (Signed)
I answered this before. Check with his pharmacy as to why he did not get the generic form. That is what I intended to give him

## 2018-07-25 NOTE — Telephone Encounter (Signed)
I intended for him to get the generic, so please speak to the pharmacy about this

## 2018-07-25 NOTE — Telephone Encounter (Signed)
Dr. Fry please advise. Thanks  

## 2018-08-02 DIAGNOSIS — M545 Low back pain: Secondary | ICD-10-CM | POA: Diagnosis not present

## 2018-08-02 DIAGNOSIS — M79671 Pain in right foot: Secondary | ICD-10-CM | POA: Diagnosis not present

## 2018-08-02 NOTE — Telephone Encounter (Signed)
The insurance will not cover the Generic form of the medication given.  He can pay out of pocket but this is $170 for a 30 day supply.

## 2018-08-03 NOTE — Telephone Encounter (Signed)
I suggest he pay cash for the generic Colchicine but use [facility] with that

## 2018-08-14 DIAGNOSIS — M79671 Pain in right foot: Secondary | ICD-10-CM | POA: Insufficient documentation

## 2018-08-16 DIAGNOSIS — M79671 Pain in right foot: Secondary | ICD-10-CM | POA: Diagnosis not present

## 2018-08-16 DIAGNOSIS — C44519 Basal cell carcinoma of skin of other part of trunk: Secondary | ICD-10-CM | POA: Diagnosis not present

## 2018-08-16 DIAGNOSIS — D485 Neoplasm of uncertain behavior of skin: Secondary | ICD-10-CM | POA: Diagnosis not present

## 2018-08-16 DIAGNOSIS — L570 Actinic keratosis: Secondary | ICD-10-CM | POA: Diagnosis not present

## 2018-09-20 ENCOUNTER — Other Ambulatory Visit: Payer: Self-pay | Admitting: Family Medicine

## 2018-09-22 DIAGNOSIS — Z1159 Encounter for screening for other viral diseases: Secondary | ICD-10-CM | POA: Diagnosis not present

## 2018-09-25 NOTE — Telephone Encounter (Signed)
Call in #[phone] with 5 rf 

## 2018-09-25 NOTE — Telephone Encounter (Signed)
Dr. Fry please advise on refill. Thanks  

## 2018-10-01 ENCOUNTER — Encounter: Payer: Self-pay | Admitting: Family Medicine

## 2018-10-02 NOTE — Telephone Encounter (Signed)
Dr. Fry please advise. Thanks  

## 2018-10-02 NOTE — Telephone Encounter (Signed)
Set up a Doxy visit so we can discuss this

## 2018-10-03 ENCOUNTER — Ambulatory Visit (INDEPENDENT_AMBULATORY_CARE_PROVIDER_SITE_OTHER): Payer: Medicare Other | Admitting: Family Medicine

## 2018-10-03 ENCOUNTER — Other Ambulatory Visit: Payer: Self-pay

## 2018-10-03 ENCOUNTER — Encounter: Payer: Self-pay | Admitting: Family Medicine

## 2018-10-03 DIAGNOSIS — M5442 Lumbago with sciatica, left side: Secondary | ICD-10-CM | POA: Diagnosis not present

## 2018-10-03 DIAGNOSIS — G8929 Other chronic pain: Secondary | ICD-10-CM

## 2018-10-03 DIAGNOSIS — M5441 Lumbago with sciatica, right side: Secondary | ICD-10-CM | POA: Diagnosis not present

## 2018-10-03 NOTE — Progress Notes (Signed)
   Subjective:    Patient ID: Derek Cox, male    DOB: 1946/03/01, 73 y.o.   MRN: [id number]  HPI Virtual Visit via Telephone Note  I connected with the patient on 10/03/18 at  9:45 AM EDT by telephone and verified that I am speaking with the correct person using two identifiers. We attempted to connect virtually but we had technical difficulties with the audio and video.     I discussed the limitations, risks, security and privacy concerns of performing an evaluation and management service by telephone and the availability of in person appointments. I also discussed with the patient that there may be a patient responsible charge related to this service. The patient expressed understanding and agreed to proceed.  Location patient: home Location provider: work or home office Participants present for the call: patient, provider Patient did not have a visit in the prior 7 days to address this/these issue(s).   History of Present Illness: Here asking for me to refer him to Dr. Jobe Igo for a "steroid shot in my back". Apparently he has a lonog hx of low back pain, but we have never discussed this. Some years ago he was apparently sent to Dr. Jobe Igo at [facility] and he was given 2 spinal injections, but I cannot find any records about this in his chart. Truman Hayward says these shots did not help his pain at all. He has also seen Dr. Nelva Bush at [facility] for his lower back. He has episodes of severe pain in the lower back on both sides, and the pain may radiate down one or both legs to the feet. He has had numbness and tingling in both legs and feet, but he denies any weakness in the legs. He has been taking Ibuprofen and Tramadol as needed.    Observations/Objective: Patient sounds cheerful and well on the phone. I do not appreciate any SOB. Speech and thought processing are grossly intact. Patient reported vitals:  Assessment and Plan: Low back pain. The first thing we need to  do is to get updated films of the lumbar spine. We will set up an MRI soon, and will go from there.  Alysia Penna, MD   Follow Up Instructions:     [phone] [phone] 11-20 [id number] 21-30 I did not refer this patient for an OV in the next 24 hours for this/these issue(s).  I discussed the assessment and treatment plan with the patient. The patient was provided an opportunity to ask questions and all were answered. The patient agreed with the plan and demonstrated an understanding of the instructions.   The patient was advised to call back or seek an in-person evaluation if the symptoms worsen or if the condition fails to improve as anticipated.  I provided 22 minutes of non-face-to-face time during this encounter.   Alysia Penna, MD    Review of Systems     Objective:   Physical Exam        Assessment & Plan:

## 2018-10-16 ENCOUNTER — Other Ambulatory Visit: Payer: Self-pay

## 2018-10-16 ENCOUNTER — Ambulatory Visit
Admission: RE | Admit: 2018-10-16 | Discharge: 2018-10-16 | Disposition: A | Discharge status: Home / Self Care | Payer: Medicare Other | Source: Ambulatory Visit | Attending: Family Medicine | Admitting: Family Medicine

## 2018-10-16 ENCOUNTER — Encounter: Payer: Self-pay | Admitting: Family Medicine

## 2018-10-16 DIAGNOSIS — G8929 Other chronic pain: Secondary | ICD-10-CM

## 2018-10-16 DIAGNOSIS — M48061 Spinal stenosis, lumbar region without neurogenic claudication: Secondary | ICD-10-CM | POA: Diagnosis not present

## 2018-10-16 DIAGNOSIS — M5442 Lumbago with sciatica, left side: Secondary | ICD-10-CM

## 2018-10-20 ENCOUNTER — Encounter: Payer: Self-pay | Admitting: Family Medicine

## 2018-10-23 ENCOUNTER — Other Ambulatory Visit: Payer: Self-pay | Admitting: Family Medicine

## 2018-10-23 DIAGNOSIS — M5136 Other intervertebral disc degeneration, lumbar region: Secondary | ICD-10-CM

## 2018-10-23 DIAGNOSIS — M5126 Other intervertebral disc displacement, lumbar region: Secondary | ICD-10-CM

## 2018-11-02 DIAGNOSIS — M4316 Spondylolisthesis, lumbar region: Secondary | ICD-10-CM | POA: Diagnosis not present

## 2018-11-02 DIAGNOSIS — M48062 Spinal stenosis, lumbar region with neurogenic claudication: Secondary | ICD-10-CM | POA: Diagnosis not present

## 2018-11-02 DIAGNOSIS — M47816 Spondylosis without myelopathy or radiculopathy, lumbar region: Secondary | ICD-10-CM | POA: Diagnosis not present

## 2018-11-16 ENCOUNTER — Encounter: Payer: Self-pay | Admitting: Family Medicine

## 2018-11-16 NOTE — Telephone Encounter (Signed)
The only way he can get actual copies of the films would be to go to the facility where he had the study and pay them cash to print them out. I suggest he make an in person OV with me and have his wife come with him, that way I can pull up the images and answer their questions

## 2018-11-16 NOTE — Telephone Encounter (Signed)
Please advise 

## 2018-12-29 ENCOUNTER — Encounter: Payer: Self-pay | Admitting: Family Medicine

## 2018-12-31 NOTE — Telephone Encounter (Signed)
I agree, he should get the Shingrix vaccine at his pharmacy. 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 As far as the Covid vaccine, I have not seen any advice either way. It is too early to tell, but I am sure the ultimate vaccine that is chosen will have specific advice

## 2019-01-06 ENCOUNTER — Encounter: Payer: Self-pay | Admitting: Family Medicine

## 2019-01-08 MED ORDER — LEVETIRACETAM 500 MG PO TABS: 500.0000 mg | ORAL_TABLET | Freq: Two times a day (BID) | ORAL | 3 refills | Status: DC

## 2019-01-23 DIAGNOSIS — C44629 Squamous cell carcinoma of skin of left upper limb, including shoulder: Secondary | ICD-10-CM | POA: Diagnosis not present

## 2019-01-23 DIAGNOSIS — D485 Neoplasm of uncertain behavior of skin: Secondary | ICD-10-CM | POA: Diagnosis not present

## 2019-02-20 ENCOUNTER — Encounter: Payer: Self-pay | Admitting: Family Medicine

## 2019-02-28 DIAGNOSIS — C44629 Squamous cell carcinoma of skin of left upper limb, including shoulder: Secondary | ICD-10-CM | POA: Diagnosis not present

## 2019-04-01 ENCOUNTER — Encounter: Payer: Self-pay | Admitting: Family Medicine

## 2019-04-02 ENCOUNTER — Other Ambulatory Visit: Payer: Self-pay | Admitting: Family Medicine

## 2019-04-03 NOTE — Telephone Encounter (Signed)
Last filled 09/26/2018 Last OV 10/03/2018  Ok to fill?

## 2019-04-14 ENCOUNTER — Ambulatory Visit: Payer: Medicare Other | Attending: Internal Medicine

## 2019-04-14 DIAGNOSIS — Z23 Encounter for immunization: Secondary | ICD-10-CM | POA: Insufficient documentation

## 2019-04-14 NOTE — Progress Notes (Signed)
   Covid-19 Vaccination Clinic  Name:  Derek Cox    MRN: [id number] DOB: September 30, 1945  04/14/2019  Derek Cox was observed post Covid-19 immunization for 15 minutes without incidence. He was provided with Vaccine Information Sheet and instruction to access the V-Safe system.   Derek Cox was instructed to call 911 with any severe reactions post vaccine: Marland Kitchen Difficulty breathing  . Swelling of your face and throat  . A fast heartbeat  . A bad rash all over your body  . Dizziness and weakness

## 2019-04-23 ENCOUNTER — Other Ambulatory Visit: Payer: Self-pay

## 2019-04-23 ENCOUNTER — Ambulatory Visit (INDEPENDENT_AMBULATORY_CARE_PROVIDER_SITE_OTHER): Payer: Medicare Other | Admitting: Family Medicine

## 2019-04-23 ENCOUNTER — Other Ambulatory Visit (INDEPENDENT_AMBULATORY_CARE_PROVIDER_SITE_OTHER): Payer: Medicare Other

## 2019-04-23 ENCOUNTER — Encounter: Payer: Self-pay | Admitting: Family Medicine

## 2019-04-23 VITALS — BP 140/80 | HR 84 | Temp 97.8°F | Ht 69.0 in | Wt 171.6 lb

## 2019-04-23 DIAGNOSIS — Z8669 Personal history of other diseases of the nervous system and sense organs: Secondary | ICD-10-CM

## 2019-04-23 DIAGNOSIS — N138 Other obstructive and reflux uropathy: Secondary | ICD-10-CM | POA: Diagnosis not present

## 2019-04-23 DIAGNOSIS — G4700 Insomnia, unspecified: Secondary | ICD-10-CM

## 2019-04-23 DIAGNOSIS — E782 Mixed hyperlipidemia: Secondary | ICD-10-CM

## 2019-04-23 DIAGNOSIS — N401 Enlarged prostate with lower urinary tract symptoms: Secondary | ICD-10-CM

## 2019-04-23 DIAGNOSIS — N529 Male erectile dysfunction, unspecified: Secondary | ICD-10-CM | POA: Diagnosis not present

## 2019-04-23 DIAGNOSIS — G8929 Other chronic pain: Secondary | ICD-10-CM | POA: Diagnosis not present

## 2019-04-23 DIAGNOSIS — I10 Essential (primary) hypertension: Secondary | ICD-10-CM

## 2019-04-23 DIAGNOSIS — M545 Low back pain, unspecified: Secondary | ICD-10-CM

## 2019-04-23 LAB — LIPID PANEL
Cholesterol: 204 mg/dL — ABNORMAL HIGH (ref 0–200)
HDL: 67.3 mg/dL (ref >39.00)
LDL Cholesterol: 105 mg/dL — ABNORMAL HIGH (ref 0–99)
NonHDL: 136.88
Total CHOL/HDL Ratio: 3
Triglycerides: 158 mg/dL — ABNORMAL HIGH (ref 0.0–149.0)
VLDL: 31.6 mg/dL (ref 0.0–40.0)

## 2019-04-23 LAB — BASIC METABOLIC PANEL
BUN: 12 mg/dL (ref 6–23)
CO2: 30 mEq/L (ref 19–32)
Calcium: 9.2 mg/dL (ref 8.4–10.5)
Chloride: 104 mEq/L (ref 96–112)
Creatinine, Ser: 1.01 mg/dL (ref 0.40–1.50)
GFR: 71.97 mL/min (ref >60.00)
Glucose, Bld: 98 mg/dL (ref 70–99)
Potassium: 3.7 mEq/L (ref 3.5–5.1)
Sodium: 140 mEq/L (ref 135–145)

## 2019-04-23 LAB — CBC WITH DIFFERENTIAL/PLATELET
Basophils Absolute: 0 10*3/uL (ref 0.0–0.1)
Basophils Relative: 0.8 % (ref 0.0–3.0)
Eosinophils Absolute: 0.1 10*3/uL (ref 0.0–0.7)
Eosinophils Relative: 2.2 % (ref 0.0–5.0)
HCT: 45 % (ref 39.0–52.0)
Hemoglobin: 15.3 g/dL (ref 13.0–17.0)
Lymphocytes Relative: 22.9 % (ref 12.0–46.0)
Lymphs Abs: 1.4 10*3/uL (ref 0.7–4.0)
MCHC: 34 g/dL (ref 30.0–36.0)
MCV: 91.9 fl (ref 78.0–100.0)
Monocytes Absolute: 0.4 10*3/uL (ref 0.1–1.0)
Monocytes Relative: 7.3 % (ref 3.0–12.0)
Neutro Abs: 4.1 10*3/uL (ref 1.4–7.7)
Neutrophils Relative %: 66.8 % (ref 43.0–77.0)
Platelets: 226 10*3/uL (ref 150.0–400.0)
RBC: 4.9 Mil/uL (ref 4.22–5.81)
RDW: 12.7 % (ref 11.5–15.5)
WBC: 6.2 10*3/uL (ref 4.0–10.5)

## 2019-04-23 LAB — HEPATIC FUNCTION PANEL
ALT: 17 U/L (ref 0–53)
AST: 19 U/L (ref 0–37)
Albumin: 4.5 g/dL (ref 3.5–5.2)
Alkaline Phosphatase: 58 U/L (ref 39–117)
Bilirubin, Direct: 0.2 mg/dL (ref 0.0–0.3)
Total Bilirubin: 0.8 mg/dL (ref 0.2–1.2)
Total Protein: 6.6 g/dL (ref 6.0–8.3)

## 2019-04-23 LAB — PSA: PSA: 1.79 ng/mL (ref 0.10–4.00)

## 2019-04-23 LAB — TSH: TSH: 2.31 u[IU]/mL (ref 0.35–4.50)

## 2019-04-23 MED ORDER — LEVETIRACETAM 500 MG PO TABS: 500.0000 mg | ORAL_TABLET | Freq: Two times a day (BID) | ORAL | 3 refills | Status: DC

## 2019-04-23 MED ORDER — ALPRAZOLAM 0.25 MG PO TABS: 0.2500 mg | ORAL_TABLET | Freq: Two times a day (BID) | ORAL | 1 refills | Status: DC | PRN

## 2019-04-23 MED ORDER — ROSUVASTATIN CALCIUM 5 MG PO TABS: 5.0000 mg | ORAL_TABLET | Freq: Every day | ORAL | 3 refills | Status: DC

## 2019-04-23 MED ORDER — LISINOPRIL 20 MG PO TABS: 20.0000 mg | ORAL_TABLET | Freq: Every day | ORAL | 3 refills | Status: DC

## 2019-04-23 MED ORDER — COLCHICINE 0.6 MG PO TABS: 0.6000 mg | ORAL_TABLET | Freq: Two times a day (BID) | ORAL | 3 refills | Status: DC

## 2019-04-23 MED ORDER — SILDENAFIL CITRATE 20 MG PO TABS: 40.0000 mg | ORAL_TABLET | ORAL | 11 refills | Status: DC | PRN

## 2019-04-23 NOTE — Progress Notes (Signed)
Subjective:    Patient ID: Derek Cox, male    DOB: Nov 10, 1945, 74 y.o.   MRN: [id number]  HPI Here to follow up on issues. He feels well except for the chronic low back pain. He has seen Dr. Arnoldo Morale (who recommended surgery), but Truman Hayward wants to put this off as long as possible. Currently he takes nothing for the pain. His BP is stable. He sleeps well.    Review of Systems  Constitutional: Negative.   HENT: Negative.   Eyes: Negative.   Respiratory: Negative.   Cardiovascular: Negative.   Gastrointestinal: Negative.   Genitourinary: Negative.   Musculoskeletal: Negative.   Skin: Negative.   Neurological: Negative.   Psychiatric/Behavioral: Negative.        Objective:   Physical Exam Constitutional:      General: He is not in acute distress.    Appearance: He is well-developed. He is not diaphoretic.  HENT:     Head: Normocephalic and atraumatic.     Right Ear: External ear normal.     Left Ear: External ear normal.     Nose: Nose normal.     Mouth/Throat:     Pharynx: No oropharyngeal exudate.  Eyes:     General: No scleral icterus.       Right eye: No discharge.        Left eye: No discharge.     Conjunctiva/sclera: Conjunctivae normal.     Pupils: Pupils are equal, round, and reactive to light.  Neck:     Thyroid: No thyromegaly.     Vascular: No JVD.     Trachea: No tracheal deviation.  Cardiovascular:     Rate and Rhythm: Normal rate and regular rhythm.     Heart sounds: Normal heart sounds. No murmur. No friction rub. No gallop.   Pulmonary:     Effort: Pulmonary effort is normal. No respiratory distress.     Breath sounds: Normal breath sounds. No wheezing or rales.  Chest:     Chest wall: No tenderness.  Abdominal:     General: Bowel sounds are normal. There is no distension.     Palpations: Abdomen is soft. There is no mass.     Tenderness: There is no abdominal tenderness. There is no guarding or rebound.  Genitourinary:    Penis: Normal. No  tenderness.      Testes: Normal.     Prostate: Normal.     Rectum: Normal. Guaiac result negative.  Musculoskeletal:        General: No tenderness. Normal range of motion.     Cervical back: Neck supple.  Lymphadenopathy:     Cervical: No cervical adenopathy.  Skin:    General: Skin is warm and dry.     Coloration: Skin is not pale.     Findings: No erythema or rash.  Neurological:     Mental Status: He is alert and oriented to person, place, and time.     Cranial Nerves: No cranial nerve deficit.     Motor: No abnormal muscle tone.     Coordination: Coordination normal.     Deep Tendon Reflexes: Reflexes are normal and symmetric. Reflexes normal.  Psychiatric:        Behavior: Behavior normal.        Thought Content: Thought content normal.        Judgment: Judgment normal.           Assessment & Plan:  His HTN is stable. Doing well after  his stroke. The BPH is stable. 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 He wil get fasting labs today.  Alysia Penna, MD

## 2019-05-01 DIAGNOSIS — C44321 Squamous cell carcinoma of skin of nose: Secondary | ICD-10-CM | POA: Diagnosis not present

## 2019-05-01 DIAGNOSIS — D485 Neoplasm of uncertain behavior of skin: Secondary | ICD-10-CM | POA: Diagnosis not present

## 2019-05-01 DIAGNOSIS — Z23 Encounter for immunization: Secondary | ICD-10-CM | POA: Diagnosis not present

## 2019-05-02 ENCOUNTER — Ambulatory Visit: Payer: Medicare Other | Attending: Internal Medicine

## 2019-05-02 DIAGNOSIS — Z23 Encounter for immunization: Secondary | ICD-10-CM

## 2019-05-02 NOTE — Progress Notes (Signed)
   Covid-19 Vaccination Clinic  Name:  Corlis Rossen    MRN: [id number] DOB: February 21, 1946  05/02/2019  Mr. Willever was observed post Covid-19 immunization for 15 minutes without incidence. He was provided with Vaccine Information Sheet and instruction to access the V-Safe system.   Mr. Feria was instructed to call 911 with any severe reactions post vaccine: Marland Kitchen Difficulty breathing  . Swelling of your face and throat  . A fast heartbeat  . A bad rash all over your body  . Dizziness and weakness    Immunizations Administered    Name Date Dose VIS Date Route   Pfizer COVID-19 Vaccine 05/02/2019  9:05 AM 0.3 mL 03/08/2019 Intramuscular   Manufacturer: [place]   Lot: [id number]   [place]: [id number]

## 2019-05-08 ENCOUNTER — Telehealth: Payer: Self-pay | Admitting: Family Medicine

## 2019-05-08 NOTE — Telephone Encounter (Signed)
Medication Refill: Indomethacin 50MG    Pharmacy: [facility] [name] Fax: [phone]   Pt can be reached at [phone]

## 2019-05-08 NOTE — Telephone Encounter (Signed)
Please advise. Rx is not on the current med list 

## 2019-05-09 MED ORDER — INDOMETHACIN 50 MG PO CAPS: 50.0000 mg | ORAL_CAPSULE | Freq: Three times a day (TID) | ORAL | 5 refills | Status: DC | PRN

## 2019-05-09 NOTE — Telephone Encounter (Signed)
Done

## 2019-05-09 NOTE — Telephone Encounter (Signed)
Patient is aware 

## 2019-06-05 DIAGNOSIS — C44321 Squamous cell carcinoma of skin of nose: Secondary | ICD-10-CM | POA: Diagnosis not present

## 2019-06-17 ENCOUNTER — Other Ambulatory Visit: Payer: Self-pay

## 2019-06-17 ENCOUNTER — Encounter: Payer: Self-pay | Admitting: Family Medicine

## 2019-06-17 ENCOUNTER — Ambulatory Visit (INDEPENDENT_AMBULATORY_CARE_PROVIDER_SITE_OTHER): Payer: Medicare Other | Admitting: Family Medicine

## 2019-06-17 VITALS — BP 128/64 | HR 76 | Temp 97.6°F | Wt 170.4 lb

## 2019-06-17 DIAGNOSIS — K529 Noninfective gastroenteritis and colitis, unspecified: Secondary | ICD-10-CM

## 2019-06-17 MED ORDER — DIPHENOXYLATE-ATROPINE 2.5-0.025 MG PO TABS: 2.0000 | ORAL_TABLET | Freq: Four times a day (QID) | ORAL | 0 refills | Status: DC | PRN

## 2019-06-17 NOTE — Progress Notes (Signed)
   Subjective:    Patient ID: Derek Cox, male    DOB: [birth year], 74 y.o.   MRN: [id number]  HPI Here for the onset 2 days ago of watery diarrhea. No other symptoms, no cramps or pain, ni nausea or vomiting, no fever. No URI symptoms. No recent antibiotic use, no recent travel. Imodium does not help very much. He is drinking water and Pedialyte. Of note, he received his second Covid vaccine 6 weeks ago.    Review of Systems  Constitutional: Negative.   Respiratory: Negative.   Cardiovascular: Negative.   Gastrointestinal: Positive for diarrhea. Negative for abdominal distention, abdominal pain, anal bleeding, blood in stool, constipation, nausea, rectal pain and vomiting.  Genitourinary: Negative.        Objective:   Physical Exam Constitutional:      Appearance: Normal appearance. He is not ill-appearing.  Eyes:     General: No scleral icterus. Cardiovascular:     Rate and Rhythm: Normal rate and regular rhythm.     Pulses: Normal pulses.     Heart sounds: Normal heart sounds.  Pulmonary:     Effort: Pulmonary effort is normal.     Breath sounds: Normal breath sounds.  Abdominal:     General: Abdomen is flat. Bowel sounds are normal. There is no distension.     Palpations: Abdomen is soft. There is no mass.     Tenderness: There is no abdominal tenderness. There is no guarding or rebound.     Hernia: No hernia is present.  Neurological:     Mental Status: He is alert.           Assessment & Plan:  Viral enteritis. He will try Lomotil for the diarrhea. Drink fluids. Recheck prn. Alysia Penna, MD

## 2019-10-01 DIAGNOSIS — L570 Actinic keratosis: Secondary | ICD-10-CM | POA: Diagnosis not present

## 2019-10-08 ENCOUNTER — Other Ambulatory Visit: Payer: Self-pay | Admitting: Neurosurgery

## 2019-10-18 ENCOUNTER — Other Ambulatory Visit: Payer: Self-pay | Admitting: Neurosurgery

## 2019-10-21 ENCOUNTER — Other Ambulatory Visit: Payer: Self-pay | Admitting: Family Medicine

## 2019-11-01 NOTE — Pre-Procedure Instructions (Signed)
Derek Cox  11/01/2019      [id number] [place], [place] - [address] [place] [address] [place] [address] Phone: [phone] Fax: [facility] [place], [place] [address] Phone: [phone] Fax: [phone]    Your procedure is scheduled on 11/06/19.  Report to [facility] Admitting at 830 A.M.  Call this number if you have problems the morning of surgery:  [phone]   Remember:  Do not eat or drink after midnight.     Take these medicines the morning of surgery with A SIP OF WATER  levETIRAcetam (KEPPRA)     Do not wear jewelry, make-up or nail polish.  Do not wear lotions, powders, or perfumes, or deodorant.  Do not shave 48 hours prior to surgery.  Men may shave face and neck.  Do not bring valuables to the hospital.  [facility] is not responsible for any belongings or valuables.  Contacts, dentures or bridgework may not be worn into surgery.  Leave your suitcase in the car.  After surgery it may be brought to your room.  For patients admitted to the hospital, discharge time will be determined by your treatment team.  Patients discharged the day of surgery will not be allowed to drive home.   Do not take any aspirin,anti-inflammatories,vitamins,or herbal supplements 5-7 days prior to surgery.[name] - Preparing for Surgery  Before surgery, you can play an important role.  Because skin is not sterile, your skin needs to be as free of germs as possible.  You can reduce the number of germs on you skin by washing with CHG (chlorahexidine gluconate) soap before surgery.  CHG is an antiseptic cleaner which kills germs and bonds with the skin to continue killing germs even after washing.  Oral Hygiene is also important in reducing the risk of infection.  Remember to brush your teeth with your regular toothpaste the morning  of surgery.  Please DO NOT use if you have an allergy to CHG or antibacterial soaps.  If your skin becomes reddened/irritated stop using the CHG and inform your nurse when you arrive at Short Stay.  Do not shave (including legs and underarms) for at least 48 hours prior to the first CHG shower.  You may shave your face.  Please follow these instructions carefully:   1.  Shower with CHG Soap the night before surgery and the morning of Surgery.  2.  If you choose to wash your hair, wash your hair first as usual with your normal shampoo.  3.  After you shampoo, rinse your hair and body thoroughly to remove the shampoo. 4.  Use CHG as you would any other liquid soap.  You can apply chg directly to the skin and wash gently with a      scrungie or washcloth.           5.  Apply the CHG Soap to your body ONLY FROM THE NECK DOWN.   Do not use on open wounds or open sores. Avoid contact with your eyes, ears, mouth and genitals (private parts).  Wash genitals (private parts) with your normal soap.  6.  Wash thoroughly, paying special attention to the area where your surgery will be performed.  7.  Thoroughly rinse your body with warm water from the neck down.  8.  DO NOT shower/wash with your  normal soap after using and rinsing off the CHG Soap.  9.  Pat yourself dry with a clean towel.            10.  Wear clean pajamas.            11.  Place clean sheets on your bed the night of your first shower and do not sleep with pets.  Day of Surgery  Do not apply any lotions/deoderants the morning of surgery.   Please wear clean clothes to the hospital/surgery center. Remember to brush your teeth with toothpaste.

## 2019-11-04 ENCOUNTER — Encounter (HOSPITAL_COMMUNITY): Payer: Self-pay

## 2019-11-04 ENCOUNTER — Other Ambulatory Visit (HOSPITAL_COMMUNITY)
Admission: RE | Admit: 2019-11-04 | Discharge: 2019-11-04 | Disposition: A | Discharge status: Home / Self Care | Payer: Medicare Other | Source: Ambulatory Visit | Attending: Neurosurgery | Admitting: Neurosurgery

## 2019-11-04 ENCOUNTER — Other Ambulatory Visit: Payer: Self-pay

## 2019-11-04 ENCOUNTER — Encounter (HOSPITAL_COMMUNITY)
Admission: RE | Admit: 2019-11-04 | Discharge: 2019-11-04 | Disposition: A | Discharge status: Home / Self Care | Payer: Medicare Other | Source: Ambulatory Visit | Attending: Neurosurgery | Admitting: Neurosurgery

## 2019-11-04 DIAGNOSIS — Z20822 Contact with and (suspected) exposure to covid-19: Secondary | ICD-10-CM | POA: Insufficient documentation

## 2019-11-04 DIAGNOSIS — M5116 Intervertebral disc disorders with radiculopathy, lumbar region: Secondary | ICD-10-CM | POA: Diagnosis not present

## 2019-11-04 DIAGNOSIS — Z85820 Personal history of malignant melanoma of skin: Secondary | ICD-10-CM | POA: Diagnosis not present

## 2019-11-04 DIAGNOSIS — Z01818 Encounter for other preprocedural examination: Secondary | ICD-10-CM | POA: Insufficient documentation

## 2019-11-04 DIAGNOSIS — R339 Retention of urine, unspecified: Secondary | ICD-10-CM | POA: Diagnosis not present

## 2019-11-04 DIAGNOSIS — M4316 Spondylolisthesis, lumbar region: Secondary | ICD-10-CM | POA: Diagnosis not present

## 2019-11-04 DIAGNOSIS — Z01812 Encounter for preprocedural laboratory examination: Secondary | ICD-10-CM | POA: Insufficient documentation

## 2019-11-04 DIAGNOSIS — I10 Essential (primary) hypertension: Secondary | ICD-10-CM | POA: Diagnosis not present

## 2019-11-04 DIAGNOSIS — E785 Hyperlipidemia, unspecified: Secondary | ICD-10-CM | POA: Diagnosis not present

## 2019-11-04 DIAGNOSIS — Z8673 Personal history of transient ischemic attack (TIA), and cerebral infarction without residual deficits: Secondary | ICD-10-CM | POA: Diagnosis not present

## 2019-11-04 DIAGNOSIS — M48062 Spinal stenosis, lumbar region with neurogenic claudication: Secondary | ICD-10-CM | POA: Diagnosis not present

## 2019-11-04 DIAGNOSIS — Z87891 Personal history of nicotine dependence: Secondary | ICD-10-CM | POA: Diagnosis not present

## 2019-11-04 DIAGNOSIS — Z79899 Other long term (current) drug therapy: Secondary | ICD-10-CM | POA: Diagnosis not present

## 2019-11-04 DIAGNOSIS — G4730 Sleep apnea, unspecified: Secondary | ICD-10-CM | POA: Diagnosis not present

## 2019-11-04 LAB — SURGICAL PCR SCREEN
MRSA, PCR: NEGATIVE
Staphylococcus aureus: NEGATIVE

## 2019-11-04 LAB — BASIC METABOLIC PANEL
Anion gap: 10 (ref 5–15)
BUN: 16 mg/dL (ref 8–23)
CO2: 28 mmol/L (ref 22–32)
Calcium: 9.3 mg/dL (ref 8.9–10.3)
Chloride: 103 mmol/L (ref 98–111)
Creatinine, Ser: 1.22 mg/dL (ref 0.61–1.24)
GFR calc Af Amer: >60 mL/min (ref >60)
GFR calc non Af Amer: 58 mL/min — ABNORMAL LOW (ref >60)
Glucose, Bld: 78 mg/dL (ref 70–99)
Potassium: 4.1 mmol/L (ref 3.5–5.1)
Sodium: 141 mmol/L (ref 135–145)

## 2019-11-04 LAB — CBC
HCT: 47.5 % (ref 39.0–52.0)
Hemoglobin: 15.7 g/dL (ref 13.0–17.0)
MCH: 30.7 pg (ref 26.0–34.0)
MCHC: 33.1 g/dL (ref 30.0–36.0)
MCV: 93 fL (ref 80.0–100.0)
Platelets: 209 10*3/uL (ref 150–400)
RBC: 5.11 MIL/uL (ref 4.22–5.81)
RDW: 12.2 % (ref 11.5–15.5)
WBC: 5.7 10*3/uL (ref 4.0–10.5)
nRBC: 0 % (ref 0.0–0.2)

## 2019-11-04 LAB — TYPE AND SCREEN
ABO/RH(D): O NEG
Antibody Screen: NEGATIVE

## 2019-11-04 LAB — SARS CORONAVIRUS 2 (TAT 6-24 HRS): SARS Coronavirus 2: NEGATIVE

## 2019-11-04 NOTE — Progress Notes (Signed)
Your procedure is scheduled on Wednesday, August 11.  Report to [facility] Main Entrance "A" at 08:30 A.M., and check in at the Admitting office.  Call this number if you have problems the morning of surgery: [phone]  Call [phone] if you have any questions prior to your surgery date Monday-Friday 8am-4pm   Remember: Do not eat or drink after midnight the night before your surgery  Take these medicines the morning of surgery with A SIP OF WATER: levETIRAcetam (KEPPRA) rosuvastatin (CRESTOR) Simvastatin (ZOCOR)  As of today, STOP taking any diclofenac (VOLTAREN), Aspirin (unless otherwise instructed by your surgeon), Aleve, Naproxen, Ibuprofen, Motrin, Advil, Goody's, BC's, all herbal medications, fish oil, and all vitamins.    The Morning of Surgery  Do not wear jewelry  Do not wear lotions, powders, colognes, or deodorant Men may shave face and neck.  Do not bring valuables to the hospital.  [facility] is not responsible for any belongings or valuables.  If you are a smoker, DO NOT Smoke 24 hours prior to surgery  If you wear a CPAP at night please bring your mask the morning of surgery   Remember that you must have someone to transport you home after your surgery, and remain with you for 24 hours if you are discharged the same day.   Please bring cases for contacts, glasses, hearing aids, dentures or bridgework because it cannot be worn into surgery.    Leave your suitcase in the car.  After surgery it may be brought to your room.  For patients admitted to the hospital, discharge time will be determined by your treatment team.  Patients discharged the day of surgery will not be allowed to drive home.    Special instructions:   [place]- Preparing For Surgery  Before surgery, you can play an important role. Because skin is not sterile, your skin needs to be as free of germs as possible. You can reduce the number of germs on your skin by washing with CHG  (chlorahexidine gluconate) Soap before surgery.  CHG is an antiseptic cleaner which kills germs and bonds with the skin to continue killing germs even after washing.    Oral Hygiene is also important to reduce your risk of infection.  Remember - BRUSH YOUR TEETH THE MORNING OF SURGERY WITH YOUR REGULAR TOOTHPASTE  Please do not use if you have an allergy to CHG or antibacterial soaps. If your skin becomes reddened/irritated stop using the CHG.  Do not shave (including legs and underarms) for at least 48 hours prior to first CHG shower. It is OK to shave your face.  Please follow these instructions carefully.   1. Shower the NIGHT BEFORE SURGERY and the MORNING OF SURGERY with CHG Soap.   2. If you chose to wash your hair and body, wash as usual with your normal shampoo and body-wash/soap.  3. Rinse your hair and body thoroughly to remove the shampoo and soap.  4. Apply CHG directly to the skin (ONLY FROM THE NECK DOWN) and wash gently with a scrungie or a clean washcloth.   5. Do not use on open wounds or open sores. Avoid contact with your eyes, ears, mouth and genitals (private parts). Wash Face and genitals (private parts)  with your normal soap.   6. Wash thoroughly, paying special attention to the area where your surgery will be performed.  7. Thoroughly rinse your body with warm water from the neck down.  8. DO NOT shower/wash with your  normal soap after using and rinsing off the CHG Soap.  9. Pat yourself dry with a CLEAN TOWEL.  10. Wear CLEAN PAJAMAS to bed the night before surgery  11. Place CLEAN SHEETS on your bed the night of your first shower and DO NOT SLEEP WITH PETS.  12. Wear comfortable clothes the morning of surgery.     Day of Surgery:  Please shower the morning of surgery with the CHG soap Do not apply any deodorants/lotions. Please wear clean clothes to the hospital/surgery center.   Remember to brush your teeth WITH YOUR REGULAR TOOTHPASTE.   Please  read over the following fact sheets that you were given.

## 2019-11-04 NOTE — Progress Notes (Signed)
PCP - Dr Alysia Penna, MD Cardiologist - pt denies    Chest x-ray - n/a EKG - 11/04/19 Stress Test - pt denies ECHO - 01/14/09 Cardiac Cath - pt denies    Blood Thinner Instructions: n/a Aspirin Instructions: n/a   COVID TEST- 11/04/19  Coronavirus Screening  Have you experienced the following symptoms:  Cough yes/no: No Fever (>100.28F)  yes/no: No Runny nose yes/no: No Sore throat yes/no: No Difficulty breathing/shortness of breath  yes/no: No  Have you or a family member traveled in the last 14 days and where? yes/no: No   If the patient indicates "YES" to the above questions, their PAT will be rescheduled to limit the exposure to others and, the surgeon will be notified. THE PATIENT WILL NEED TO BE ASYMPTOMATIC FOR 14 DAYS.   If the patient is not experiencing any of these symptoms, the PAT nurse will instruct them to NOT bring anyone with them to their appointment since they may have these symptoms or traveled as well.   Please remind your patients and families that hospital visitation restrictions are in effect and the importance of the restrictions.     Anesthesia review: n/a  Patient denies shortness of breath, fever, cough and chest pain at PAT appointment   All instructions explained to the patient, with a verbal understanding of the material. Patient agrees to go over the instructions while at home for a better understanding. Patient also instructed to self quarantine after being tested for COVID-19. The opportunity to ask questions was provided.

## 2019-11-06 ENCOUNTER — Inpatient Hospital Stay (HOSPITAL_COMMUNITY): Payer: Medicare Other

## 2019-11-06 ENCOUNTER — Inpatient Hospital Stay (HOSPITAL_COMMUNITY): Payer: Medicare Other | Admitting: Certified Registered"

## 2019-11-06 ENCOUNTER — Inpatient Hospital Stay (HOSPITAL_COMMUNITY): Payer: Medicare Other | Admitting: Vascular Surgery

## 2019-11-06 ENCOUNTER — Encounter (HOSPITAL_COMMUNITY): Payer: Self-pay | Admitting: Neurosurgery

## 2019-11-06 ENCOUNTER — Other Ambulatory Visit: Payer: Self-pay

## 2019-11-06 ENCOUNTER — Encounter (HOSPITAL_COMMUNITY)
Admission: RE | Disposition: A | Discharge status: Home / Self Care | Payer: Self-pay | Source: Ambulatory Visit | Attending: Neurosurgery

## 2019-11-06 ENCOUNTER — Inpatient Hospital Stay (HOSPITAL_COMMUNITY)
Admission: RE | Admit: 2019-11-06 | Discharge: 2019-11-07 | DRG: 455 | Disposition: A | Discharge status: Home / Self Care | Payer: Medicare Other | Source: Ambulatory Visit | Attending: Neurosurgery | Admitting: Neurosurgery

## 2019-11-06 DIAGNOSIS — E785 Hyperlipidemia, unspecified: Secondary | ICD-10-CM | POA: Diagnosis present

## 2019-11-06 DIAGNOSIS — Z8673 Personal history of transient ischemic attack (TIA), and cerebral infarction without residual deficits: Secondary | ICD-10-CM | POA: Diagnosis not present

## 2019-11-06 DIAGNOSIS — Z87891 Personal history of nicotine dependence: Secondary | ICD-10-CM

## 2019-11-06 DIAGNOSIS — I10 Essential (primary) hypertension: Secondary | ICD-10-CM | POA: Diagnosis present

## 2019-11-06 DIAGNOSIS — Z79899 Other long term (current) drug therapy: Secondary | ICD-10-CM | POA: Diagnosis not present

## 2019-11-06 DIAGNOSIS — R339 Retention of urine, unspecified: Secondary | ICD-10-CM | POA: Diagnosis not present

## 2019-11-06 DIAGNOSIS — Z85820 Personal history of malignant melanoma of skin: Secondary | ICD-10-CM | POA: Diagnosis not present

## 2019-11-06 DIAGNOSIS — M48062 Spinal stenosis, lumbar region with neurogenic claudication: Principal | ICD-10-CM | POA: Diagnosis present

## 2019-11-06 DIAGNOSIS — M4316 Spondylolisthesis, lumbar region: Secondary | ICD-10-CM | POA: Diagnosis present

## 2019-11-06 DIAGNOSIS — G4730 Sleep apnea, unspecified: Secondary | ICD-10-CM | POA: Diagnosis present

## 2019-11-06 DIAGNOSIS — Z20822 Contact with and (suspected) exposure to covid-19: Secondary | ICD-10-CM | POA: Diagnosis present

## 2019-11-06 DIAGNOSIS — M5116 Intervertebral disc disorders with radiculopathy, lumbar region: Secondary | ICD-10-CM | POA: Diagnosis present

## 2019-11-06 DIAGNOSIS — M4326 Fusion of spine, lumbar region: Secondary | ICD-10-CM | POA: Diagnosis not present

## 2019-11-06 DIAGNOSIS — Z419 Encounter for procedure for purposes other than remedying health state, unspecified: Secondary | ICD-10-CM

## 2019-11-06 SURGERY — POSTERIOR LUMBAR FUSION 1 LEVEL
Anesthesia: General | Site: Spine Lumbar

## 2019-11-06 MED ORDER — SODIUM CHLORIDE 0.9 % IV SOLN
250.0000 mL | INTRAVENOUS | Status: DC
  Administered 2019-11-06: 250 mL via INTRAVENOUS

## 2019-11-06 MED ORDER — PHENYLEPHRINE HCL-NACL 10-0.9 MG/250ML-% IV SOLN
INTRAVENOUS | Status: DC | PRN
  Administered 2019-11-06: 10 ug/min via INTRAVENOUS

## 2019-11-06 MED ORDER — PROPOFOL 10 MG/ML IV BOLUS
INTRAVENOUS | Status: DC | PRN
  Administered 2019-11-06: 140 mg via INTRAVENOUS

## 2019-11-06 MED ORDER — ROCURONIUM BROMIDE 10 MG/ML (PF) SYRINGE
PREFILLED_SYRINGE | INTRAVENOUS | Status: DC | PRN
  Administered 2019-11-06: 60 mg via INTRAVENOUS
  Administered 2019-11-06 (×2): 20 mg via INTRAVENOUS

## 2019-11-06 MED ORDER — BUPIVACAINE LIPOSOME 1.3 % IJ SUSP
INTRAMUSCULAR | Status: DC | PRN
  Administered 2019-11-06: 20 mL

## 2019-11-06 MED ORDER — ACETAMINOPHEN 325 MG PO TABS: 650.0000 mg | ORAL_TABLET | ORAL | Status: DC | PRN

## 2019-11-06 MED ORDER — DEXAMETHASONE SODIUM PHOSPHATE 10 MG/ML IJ SOLN
INTRAMUSCULAR | Status: AC
  Filled 2019-11-06: qty 1

## 2019-11-06 MED ORDER — CHLORHEXIDINE GLUCONATE CLOTH 2 % EX PADS: 6.0000 | MEDICATED_PAD | Freq: Once | CUTANEOUS | Status: DC

## 2019-11-06 MED ORDER — OXYCODONE HCL 5 MG PO TABS
10.0000 mg | ORAL_TABLET | ORAL | Status: DC | PRN
  Administered 2019-11-06 – 2019-11-07 (×3): 10 mg via ORAL
  Filled 2019-11-06 (×3): qty 2

## 2019-11-06 MED ORDER — BUPIVACAINE LIPOSOME 1.3 % IJ SUSP
20.0000 mL | Freq: Once | INTRAMUSCULAR | Status: DC
  Filled 2019-11-06: qty 20

## 2019-11-06 MED ORDER — PROPOFOL 10 MG/ML IV BOLUS
INTRAVENOUS | Status: AC
  Filled 2019-11-06: qty 20

## 2019-11-06 MED ORDER — ACETAMINOPHEN 650 MG RE SUPP: 650.0000 mg | RECTAL | Status: DC | PRN

## 2019-11-06 MED ORDER — MENTHOL 3 MG MT LOZG: 1.0000 | LOZENGE | OROMUCOSAL | Status: DC | PRN

## 2019-11-06 MED ORDER — ONDANSETRON HCL 4 MG/2ML IJ SOLN: 4.0000 mg | Freq: Four times a day (QID) | INTRAMUSCULAR | Status: DC | PRN

## 2019-11-06 MED ORDER — BACITRACIN ZINC 500 UNIT/GM EX OINT
TOPICAL_OINTMENT | CUTANEOUS | Status: DC | PRN
  Administered 2019-11-06: 1 via TOPICAL

## 2019-11-06 MED ORDER — ACETAMINOPHEN 325 MG PO TABS: 325.0000 mg | ORAL_TABLET | Freq: Once | ORAL | Status: DC | PRN

## 2019-11-06 MED ORDER — HYDROMORPHONE HCL 1 MG/ML IJ SOLN
INTRAMUSCULAR | Status: AC
  Administered 2019-11-06: 0.5 mg via INTRAVENOUS
  Filled 2019-11-06: qty 1

## 2019-11-06 MED ORDER — LACTATED RINGERS IV SOLN: INTRAVENOUS | Status: DC

## 2019-11-06 MED ORDER — ONDANSETRON HCL 4 MG/2ML IJ SOLN
INTRAMUSCULAR | Status: DC | PRN
  Administered 2019-11-06: 4 mg via INTRAVENOUS

## 2019-11-06 MED ORDER — LEVETIRACETAM 250 MG PO TABS
500.0000 mg | ORAL_TABLET | Freq: Two times a day (BID) | ORAL | Status: DC
  Administered 2019-11-06 – 2019-11-07 (×2): 500 mg via ORAL
  Filled 2019-11-06 (×2): qty 2

## 2019-11-06 MED ORDER — DEXAMETHASONE SODIUM PHOSPHATE 10 MG/ML IJ SOLN
INTRAMUSCULAR | Status: DC | PRN
  Administered 2019-11-06: 4 mg via INTRAVENOUS

## 2019-11-06 MED ORDER — LISINOPRIL 20 MG PO TABS
20.0000 mg | ORAL_TABLET | Freq: Every day | ORAL | Status: DC
  Administered 2019-11-06 – 2019-11-07 (×2): 20 mg via ORAL
  Filled 2019-11-06 (×2): qty 1

## 2019-11-06 MED ORDER — MEPERIDINE HCL 25 MG/ML IJ SOLN: 6.2500 mg | INTRAMUSCULAR | Status: DC | PRN

## 2019-11-06 MED ORDER — SODIUM CHLORIDE 0.9% FLUSH: 3.0000 mL | INTRAVENOUS | Status: DC | PRN

## 2019-11-06 MED ORDER — TAMSULOSIN HCL 0.4 MG PO CAPS
0.8000 mg | ORAL_CAPSULE | Freq: Once | ORAL | Status: AC
  Administered 2019-11-06: 0.8 mg via ORAL
  Filled 2019-11-06: qty 2

## 2019-11-06 MED ORDER — HYDROMORPHONE HCL 1 MG/ML IJ SOLN
0.2500 mg | INTRAMUSCULAR | Status: DC | PRN
  Administered 2019-11-06: 0.5 mg via INTRAVENOUS

## 2019-11-06 MED ORDER — LIDOCAINE 2% (20 MG/ML) 5 ML SYRINGE
INTRAMUSCULAR | Status: DC | PRN
  Administered 2019-11-06: 40 mg via INTRAVENOUS

## 2019-11-06 MED ORDER — ACETAMINOPHEN 10 MG/ML IV SOLN: 1000.0000 mg | Freq: Once | INTRAVENOUS | Status: DC | PRN

## 2019-11-06 MED ORDER — CYCLOBENZAPRINE HCL 10 MG PO TABS
10.0000 mg | ORAL_TABLET | Freq: Three times a day (TID) | ORAL | Status: DC | PRN
  Administered 2019-11-07: 10 mg via ORAL
  Filled 2019-11-06: qty 1

## 2019-11-06 MED ORDER — ROSUVASTATIN CALCIUM 5 MG PO TABS
5.0000 mg | ORAL_TABLET | Freq: Every day | ORAL | Status: DC
  Administered 2019-11-06 – 2019-11-07 (×2): 5 mg via ORAL
  Filled 2019-11-06 (×2): qty 1

## 2019-11-06 MED ORDER — THROMBIN 5000 UNITS EX SOLR: OROMUCOSAL | Status: DC | PRN

## 2019-11-06 MED ORDER — THROMBIN 5000 UNITS EX SOLR
CUTANEOUS | Status: AC
  Filled 2019-11-06: qty 5000

## 2019-11-06 MED ORDER — CEFAZOLIN SODIUM-DEXTROSE 2-4 GM/100ML-% IV SOLN
2.0000 g | INTRAVENOUS | Status: AC
  Administered 2019-11-06: 2 g via INTRAVENOUS
  Filled 2019-11-06: qty 100

## 2019-11-06 MED ORDER — OXYCODONE HCL 5 MG PO TABS
ORAL_TABLET | ORAL | Status: AC
  Administered 2019-11-06: 5 mg via ORAL
  Filled 2019-11-06: qty 1

## 2019-11-06 MED ORDER — SODIUM CHLORIDE 0.9% FLUSH: 3.0000 mL | Freq: Two times a day (BID) | INTRAVENOUS | Status: DC

## 2019-11-06 MED ORDER — AMISULPRIDE (ANTIEMETIC) 5 MG/2ML IV SOLN: 10.0000 mg | Freq: Once | INTRAVENOUS | Status: DC | PRN

## 2019-11-06 MED ORDER — ONDANSETRON HCL 4 MG PO TABS: 4.0000 mg | ORAL_TABLET | Freq: Four times a day (QID) | ORAL | Status: DC | PRN

## 2019-11-06 MED ORDER — FENTANYL CITRATE (PF) 250 MCG/5ML IJ SOLN
INTRAMUSCULAR | Status: AC
  Filled 2019-11-06: qty 5

## 2019-11-06 MED ORDER — ORAL CARE MOUTH RINSE: 15.0000 mL | Freq: Once | OROMUCOSAL | Status: AC

## 2019-11-06 MED ORDER — 0.9 % SODIUM CHLORIDE (POUR BTL) OPTIME
TOPICAL | Status: DC | PRN
  Administered 2019-11-06: 1000 mL

## 2019-11-06 MED ORDER — PHENYLEPHRINE 40 MCG/ML (10ML) SYRINGE FOR IV PUSH (FOR BLOOD PRESSURE SUPPORT)
PREFILLED_SYRINGE | INTRAVENOUS | Status: AC
  Filled 2019-11-06: qty 10

## 2019-11-06 MED ORDER — PHENYLEPHRINE 40 MCG/ML (10ML) SYRINGE FOR IV PUSH (FOR BLOOD PRESSURE SUPPORT)
PREFILLED_SYRINGE | INTRAVENOUS | Status: DC | PRN
  Administered 2019-11-06 (×2): 80 ug via INTRAVENOUS

## 2019-11-06 MED ORDER — OXYCODONE HCL 5 MG PO TABS
5.0000 mg | ORAL_TABLET | ORAL | Status: DC | PRN
  Administered 2019-11-07: 5 mg via ORAL
  Filled 2019-11-06: qty 1

## 2019-11-06 MED ORDER — BACITRACIN ZINC 500 UNIT/GM EX OINT
TOPICAL_OINTMENT | CUTANEOUS | Status: AC
  Filled 2019-11-06: qty 28.35

## 2019-11-06 MED ORDER — ONDANSETRON HCL 4 MG/2ML IJ SOLN
INTRAMUSCULAR | Status: AC
  Filled 2019-11-06: qty 2

## 2019-11-06 MED ORDER — ACETAMINOPHEN 500 MG PO TABS
1000.0000 mg | ORAL_TABLET | Freq: Four times a day (QID) | ORAL | Status: DC
  Administered 2019-11-06 – 2019-11-07 (×3): 1000 mg via ORAL
  Filled 2019-11-06 (×3): qty 2

## 2019-11-06 MED ORDER — BUPIVACAINE-EPINEPHRINE 0.5% -1:200000 IJ SOLN
INTRAMUSCULAR | Status: AC
  Filled 2019-11-06: qty 1

## 2019-11-06 MED ORDER — CEFAZOLIN SODIUM-DEXTROSE 2-4 GM/100ML-% IV SOLN
2.0000 g | Freq: Three times a day (TID) | INTRAVENOUS | Status: AC
  Administered 2019-11-06 – 2019-11-07 (×2): 2 g via INTRAVENOUS
  Filled 2019-11-06 (×2): qty 100

## 2019-11-06 MED ORDER — BISACODYL 10 MG RE SUPP: 10.0000 mg | Freq: Every day | RECTAL | Status: DC | PRN

## 2019-11-06 MED ORDER — BUPIVACAINE-EPINEPHRINE (PF) 0.5% -1:200000 IJ SOLN
INTRAMUSCULAR | Status: DC | PRN
  Administered 2019-11-06: 10 mL

## 2019-11-06 MED ORDER — ACETAMINOPHEN 160 MG/5ML PO SOLN: 325.0000 mg | Freq: Once | ORAL | Status: DC | PRN

## 2019-11-06 MED ORDER — SODIUM CHLORIDE 0.9 % IV SOLN: INTRAVENOUS | Status: DC | PRN

## 2019-11-06 MED ORDER — PHENOL 1.4 % MT LIQD: 1.0000 | OROMUCOSAL | Status: DC | PRN

## 2019-11-06 MED ORDER — TAMSULOSIN HCL 0.4 MG PO CAPS
0.4000 mg | ORAL_CAPSULE | Freq: Every day | ORAL | Status: DC
  Administered 2019-11-07: 0.4 mg via ORAL
  Filled 2019-11-06: qty 1

## 2019-11-06 MED ORDER — FENTANYL CITRATE (PF) 250 MCG/5ML IJ SOLN
INTRAMUSCULAR | Status: DC | PRN
  Administered 2019-11-06 (×5): 50 ug via INTRAVENOUS

## 2019-11-06 MED ORDER — CYCLOBENZAPRINE HCL 10 MG PO TABS
ORAL_TABLET | ORAL | Status: AC
  Administered 2019-11-06: 10 mg via ORAL
  Filled 2019-11-06: qty 1

## 2019-11-06 MED ORDER — ROCURONIUM BROMIDE 10 MG/ML (PF) SYRINGE
PREFILLED_SYRINGE | INTRAVENOUS | Status: AC
  Filled 2019-11-06: qty 10

## 2019-11-06 MED ORDER — DOCUSATE SODIUM 100 MG PO CAPS
100.0000 mg | ORAL_CAPSULE | Freq: Two times a day (BID) | ORAL | Status: DC
  Administered 2019-11-06 – 2019-11-07 (×2): 100 mg via ORAL
  Filled 2019-11-06 (×2): qty 1

## 2019-11-06 MED ORDER — MORPHINE SULFATE (PF) 4 MG/ML IV SOLN: 4.0000 mg | INTRAVENOUS | Status: DC | PRN

## 2019-11-06 MED ORDER — CHLORHEXIDINE GLUCONATE 0.12 % MT SOLN
15.0000 mL | Freq: Once | OROMUCOSAL | Status: AC
  Administered 2019-11-06: 15 mL via OROMUCOSAL
  Filled 2019-11-06: qty 15

## 2019-11-06 SURGICAL SUPPLY — 62 items
BAG DECANTER FOR FLEXI CONT (MISCELLANEOUS) ×3 IMPLANT
BENZOIN TINCTURE PRP APPL 2/3 (GAUZE/BANDAGES/DRESSINGS) ×3 IMPLANT
BLADE CLIPPER SURG (BLADE) ×3 IMPLANT
BUR MATCHSTICK NEURO 3.0 LAGG (BURR) ×3 IMPLANT
BUR PRECISION FLUTE 6.0 (BURR) ×3 IMPLANT
CAGE ALTERA 10X31MM-10-14-15 (Cage) ×1 IMPLANT
CAGE ALTERA 10X31X10-14 15D (Cage) ×2 IMPLANT
CANISTER SUCT 3000ML PPV (MISCELLANEOUS) ×3 IMPLANT
CAP LOCK DLX THRD (Cap) ×12 IMPLANT
CARTRIDGE OIL MAESTRO DRILL (MISCELLANEOUS) ×1 IMPLANT
CLOSURE WOUND 1/2 X4 (GAUZE/BANDAGES/DRESSINGS) ×1
CNTNR URN SCR LID CUP LEK RST (MISCELLANEOUS) ×1 IMPLANT
CONT SPEC 4OZ STRL OR WHT (MISCELLANEOUS) ×3
COVER BACK TABLE 60X90IN (DRAPES) ×3 IMPLANT
DIFFUSER DRILL AIR PNEUMATIC (MISCELLANEOUS) ×3 IMPLANT
DRAPE C-ARM 42X72 X-RAY (DRAPES) ×6 IMPLANT
DRAPE HALF SHEET 40X57 (DRAPES) ×3 IMPLANT
DRAPE LAPAROTOMY 100X72X124 (DRAPES) ×3 IMPLANT
DRAPE SURG 17X23 STRL (DRAPES) ×12 IMPLANT
DRSG OPSITE POSTOP 4X6 (GAUZE/BANDAGES/DRESSINGS) ×3 IMPLANT
ELECT BLADE 4.0 EZ CLEAN MEGAD (MISCELLANEOUS) ×3
ELECT REM PT RETURN 9FT ADLT (ELECTROSURGICAL) ×3
ELECTRODE BLDE 4.0 EZ CLN MEGD (MISCELLANEOUS) ×1 IMPLANT
ELECTRODE REM PT RTRN 9FT ADLT (ELECTROSURGICAL) ×1 IMPLANT
GAUZE SPONGE 4X4 12PLY STRL (GAUZE/BANDAGES/DRESSINGS) ×3 IMPLANT
GLOVE BIO SURGEON STRL SZ8 (GLOVE) ×6 IMPLANT
GLOVE BIO SURGEON STRL SZ8.5 (GLOVE) ×6 IMPLANT
GLOVE BIOGEL PI IND STRL 7.0 (GLOVE) ×3 IMPLANT
GLOVE BIOGEL PI IND STRL 7.5 (GLOVE) ×3 IMPLANT
GLOVE BIOGEL PI INDICATOR 7.0 (GLOVE) ×6
GLOVE BIOGEL PI INDICATOR 7.5 (GLOVE) ×6
GLOVE SS N UNI LF 6.5 STRL (GLOVE) ×9 IMPLANT
GLOVE SS N UNI LF 7.0 STRL (GLOVE) ×6 IMPLANT
GOWN STRL REUS W/ TWL LRG LVL3 (GOWN DISPOSABLE) ×3 IMPLANT
GOWN STRL REUS W/ TWL XL LVL3 (GOWN DISPOSABLE) ×2 IMPLANT
GOWN STRL REUS W/TWL 2XL LVL3 (GOWN DISPOSABLE) IMPLANT
GOWN STRL REUS W/TWL LRG LVL3 (GOWN DISPOSABLE) ×9
GOWN STRL REUS W/TWL XL LVL3 (GOWN DISPOSABLE) ×6
HEMOSTAT POWDER KIT SURGIFOAM (HEMOSTASIS) ×3 IMPLANT
KIT BASIN OR (CUSTOM PROCEDURE TRAY) ×3 IMPLANT
KIT TURNOVER KIT B (KITS) ×3 IMPLANT
NEEDLE HYPO 21X1.5 SAFETY (NEEDLE) ×3 IMPLANT
NEEDLE HYPO 22GX1.5 SAFETY (NEEDLE) ×3 IMPLANT
NS IRRIG 1000ML POUR BTL (IV SOLUTION) ×3 IMPLANT
OIL CARTRIDGE MAESTRO DRILL (MISCELLANEOUS) ×3
PACK LAMINECTOMY NEURO (CUSTOM PROCEDURE TRAY) ×3 IMPLANT
PATTIES SURGICAL .5 X1 (DISPOSABLE) IMPLANT
PUTTY DBM 10CC CALC GRAN (Putty) ×3 IMPLANT
ROD CURVED TI 6.35X45 (Rod) ×6 IMPLANT
SCREW PA DLX CREO 7.5X50 (Screw) ×6 IMPLANT
SCREW PA DLX CREO 7.5X55 (Screw) ×6 IMPLANT
SPONGE NEURO XRAY DETECT 1X3 (DISPOSABLE) IMPLANT
SPONGE SURGIFOAM ABS GEL 100 (HEMOSTASIS) IMPLANT
STRIP CLOSURE SKIN 1/2X4 (GAUZE/BANDAGES/DRESSINGS) ×2 IMPLANT
SUT VIC AB 1 CT1 18XBRD ANBCTR (SUTURE) ×1 IMPLANT
SUT VIC AB 1 CT1 8-18 (SUTURE) ×3
SUT VIC AB 2-0 CP2 18 (SUTURE) ×3 IMPLANT
SYR 20ML LL LF (SYRINGE) ×3 IMPLANT
TOWEL GREEN STERILE (TOWEL DISPOSABLE) ×3 IMPLANT
TOWEL GREEN STERILE FF (TOWEL DISPOSABLE) ×3 IMPLANT
TRAY FOLEY MTR SLVR 16FR STAT (SET/KITS/TRAYS/PACK) ×3 IMPLANT
WATER STERILE IRR 1000ML POUR (IV SOLUTION) ×3 IMPLANT

## 2019-11-06 NOTE — Op Note (Signed)
Brief history: The patient is a 74 year old white male who has complained of back and left leg pain consistent with neurogenic claudication.  He has failed medical management and was worked up with lumbar x-rays and a lumbar MRI which demonstrated an L4-5 spondylolisthesis and spinal stenosis.  I discussed the various treatment options with him.  He has weighed the risks, benefits and alternatives and decided proceed with an L4-5 decompression, instrumentation and fusion.  Preoperative diagnosis: L4-5 spondylolisthesis, facet arthropathy, degenerative disc disease, spinal stenosis compressing both the L4 and the L5 nerve roots; lumbago; lumbar radiculopathy; neurogenic claudication  Postoperative diagnosis: The same  Procedure: Bilateral L4-5 laminotomy/foraminotomies/medial facetectomy to decompress the bilateral L4 and L5 nerve roots(the work required to do this was in addition to the work required to do the posterior lumbar interbody fusion because of the patient's spinal stenosis, facet arthropathy. Etc. requiring a wide decompression of the nerve roots.);  L4-5 transforaminal lumbar interbody fusion with local morselized autograft bone and Zimmer DBM; insertion of interbody prosthesis at L4-5 (globus peek expandable interbody prosthesis); posterior nonsegmental instrumentation from L4 to L5 with globus titanium pedicle screws and rods; posterior lateral arthrodesis at L4-5 with local morselized autograft bone and Zimmer DBM.  Surgeon: Dr. Earle Gell  Asst.: Dr. Ashok Pall and Arnetha Massy, NP  Anesthesia: Gen. endotracheal  Estimated blood loss: 200 cc  Drains: None  Complications: None  Description of procedure: The patient was brought to the operating room by the anesthesia team. General endotracheal anesthesia was induced. The patient was turned to the prone position on the Wilson frame. The patient's lumbosacral region was then prepared with Betadine scrub and Betadine solution.  Sterile drapes were applied.  I then injected the area to be incised with Marcaine with epinephrine solution. I then used the scalpel to make a linear midline incision over the L4-5 interspace. I then used electrocautery to perform a bilateral subperiosteal dissection exposing the spinous process and lamina of L4 and L5. We then obtained intraoperative radiograph to confirm our location. We then inserted the Verstrac retractor to provide exposure.  I began the decompression by using the high speed drill to perform laminotomies at L4-5 bilaterally. We then used the Kerrison punches to widen the laminotomy and removed the ligamentum flavum at L4-5 bilaterally. We used the Kerrison punches to remove the medial facets at L4-5 bilaterally. We performed wide foraminotomies about the bilateral L4 and L5 nerve roots completing the decompression.  We now turned our attention to the posterior lumbar interbody fusion. I used a scalpel to incise the intervertebral disc at L4-5 bilaterally. I then performed a partial intervertebral discectomy at L4-5 bilaterally using the pituitary forceps. We prepared the vertebral endplates at Z8-5 bilaterally for the fusion by removing the soft tissues with the curettes. We then used the trial spacers to pick the appropriate sized interbody prosthesis. We prefilled his prosthesis with a combination of local morselized autograft bone that we obtained during the decompression as well as Zimmer DBM. We inserted the prefilled prosthesis into the interspace at L4-5 from the left, we then turned and expanded the prosthesis. There was a good snug fit of the prosthesis in the interspace. We then filled and the remainder of the intervertebral disc space with local morselized autograft bone and Zimmer DBM. This completed the posterior lumbar interbody arthrodesis.  During the decompression and insertion of the prosthesis the assistant protected the thecal sac and nerve roots with the D'Errico  retractor.  We now turned attention to  the instrumentation. Under fluoroscopic guidance we cannulated the bilateral L4 and L5 pedicles with the bone probe. We then removed the bone probe. We then tapped the pedicle with a 6.5 millimeter tap. We then removed the tap. We probed inside the tapped pedicle with a ball probe to rule out cortical breaches. We then inserted a 7.5 x 50 and 55 millimeter pedicle screw into the L4 and L5 pedicles bilaterally under fluoroscopic guidance. We then palpated along the medial aspect of the pedicles to rule out cortical breaches. There were none. The nerve roots were not injured. We then connected the unilateral pedicle screws with a lordotic rod. We compressed the construct and secured the rod in place with the caps. We then tightened the caps appropriately. This completed the instrumentation from L4-5 bilaterally.  We now turned our attention to the posterior lateral arthrodesis at L4-5 bilaterally. We used the high-speed drill to decorticate the remainder of the facets, pars, transverse process at L4-5 bilaterally. We then applied a combination of local morselized autograft bone and Zimmer DBM over these decorticated posterior lateral structures. This completed the posterior lateral arthrodesis.  We then obtained hemostasis using bipolar electrocautery. We irrigated the wound out with bacitracin solution. We inspected the thecal sac and nerve roots and noted they were well decompressed. We then removed the retractor.  We injected Exparel . We reapproximated patient's thoracolumbar fascia with interrupted #1 Vicryl suture. We reapproximated patient's subcutaneous tissue with interrupted 2-0 Vicryl suture. The reapproximated patient's skin with Steri-Strips and benzoin. The wound was then coated with bacitracin ointment. A sterile dressing was applied. The drapes were removed. The patient was subsequently returned to the supine position where they were extubated by the  anesthesia team. He was then transported to the post anesthesia care unit in stable condition. All sponge instrument and needle counts were reportedly correct at the end of this case.

## 2019-11-06 NOTE — Progress Notes (Signed)
Subjective: The patient is alert and pleasant.  He is in no apparent distress.  He looks well.  Objective: Vital signs in last 24 hours: Temp:  [97.8 F (36.6 C)-98 F (36.7 C)] 97.8 F (36.6 C) (08/11 1425) Pulse Rate:  [74-76] 76 (08/11 1425) Resp:  [16-20] 16 (08/11 1425) BP: (173-179)/(90-99) 179/90 (08/11 1425) SpO2:  [99 %] 99 % (08/11 1425) Weight:  [73.3 kg] 73.3 kg (08/11 0857) Estimated body mass index is 23.86 kg/m as calculated from the following:   Height as of this encounter: 5\' 9"  (1.753 m).   Weight as of this encounter: 73.3 kg.   Intake/Output from previous day: No intake/output data recorded. Intake/Output this shift: Total I/O In: 1200 [I.V.:1200] Out: 905 [Urine:805; Blood:100]  Physical exam the patient is alert and pleasant.  He is moving his lower extremities well.  Lab Results: Recent Labs    11/04/19 0915  WBC 5.7  HGB 15.7  HCT 47.5  PLT 209   BMET Recent Labs    11/04/19 0915  NA 141  K 4.1  CL 103  CO2 28  GLUCOSE 78  BUN 16  CREATININE 1.22  CALCIUM 9.3    Studies/Results: DG Lumbar Spine 2-3 Views  Result Date: 11/06/2019 CLINICAL DATA:  Posterior lumbar interbody fusion L4-5 EXAM: DG C-ARM 1-60 MIN; LUMBAR SPINE - 2-3 VIEW COMPARISON:  MRI lumbar spine 10/16/2018 FINDINGS: AP and lateral C-arm images demonstrate pedicle screw and interbody fusion at L4-5. Mild anterolisthesis L4-5 again noted and unchanged. Hardware in satisfactory position. IMPRESSION: PLIF L4-5. Electronically Signed   By: Franchot Gallo M.D.   On: 11/06/2019 14:17   DG Lumbar Spine 1 View  Result Date: 11/06/2019 CLINICAL DATA:  Posterior lumbar interbody fusion. EXAM: LUMBAR SPINE - 1 VIEW COMPARISON:  11/02/2018 FINDINGS: Single lateral view in the operating room was obtained. There is grade 1 anterolisthesis L4-5 Surgical instrument is present overlying the inferior lamina of L3. The instrument directed at the L3-4 disc space. IMPRESSION: L3-4 disc space  localized in the operating room. Electronically Signed   By: Franchot Gallo M.D.   On: 11/06/2019 14:16   DG C-Arm 1-60 Min  Result Date: 11/06/2019 CLINICAL DATA:  Posterior lumbar interbody fusion L4-5 EXAM: DG C-ARM 1-60 MIN; LUMBAR SPINE - 2-3 VIEW COMPARISON:  MRI lumbar spine 10/16/2018 FINDINGS: AP and lateral C-arm images demonstrate pedicle screw and interbody fusion at L4-5. Mild anterolisthesis L4-5 again noted and unchanged. Hardware in satisfactory position. IMPRESSION: PLIF L4-5. Electronically Signed   By: Franchot Gallo M.D.   On: 11/06/2019 14:17    Assessment/Plan: The patient is doing well.  I spoke with his wife.  LOS: 0 days     Ophelia Charter 11/06/2019, 2:47 PM

## 2019-11-06 NOTE — Progress Notes (Signed)
Orthopedic Tech Progress Note Patient Details:  Derek Cox Oct 08, 1945 [id number] Called in order to [facility] for an [name] Patient ID: Derek Cox, male   DOB: 22-Oct-1945, 74 y.o.   MRN: [id number]   Janit Pagan 11/06/2019, 4:20 PM

## 2019-11-06 NOTE — Transfer of Care (Signed)
Immediate Anesthesia Transfer of Care Note  Patient: Benedicto Capozzi  Procedure(s) Performed: POSTERIOR LUMBAR INTERBODY FUSION, INTERBODY PROSTHESIS, POSTERIOR INSTRUMENTATION LUMBAR FOUR-LUMBAR FIVE (N/A Spine Lumbar)  Patient Location: PACU  Anesthesia Type:General  Level of Consciousness: awake, alert  and oriented  Airway & Oxygen Therapy: Patient Spontanous Breathing and Patient connected to nasal cannula oxygen  Post-op Assessment: Report given to RN and Post -op Vital signs reviewed and stable  Post vital signs: Reviewed and stable  Last Vitals:  Vitals Value Taken Time  BP 179/90 11/06/19 1423  Temp    Pulse 73 11/06/19 1423  Resp 22 11/06/19 1423  SpO2 100 % 11/06/19 1423  Vitals shown include unvalidated device data.  Last Pain:  Vitals:   11/06/19 0831  PainSc: 0-No pain      Patients Stated Pain Goal: 4 (22/56/72 0919)  Complications: No complications documented.

## 2019-11-06 NOTE — Anesthesia Preprocedure Evaluation (Signed)
Anesthesia Evaluation  Patient identified by MRN, date of birth, ID band Patient awake    Reviewed: Allergy & Precautions, NPO status , Patient's Chart, lab work & pertinent test results  Airway Mallampati: II  TM Distance: >3 FB Neck ROM: Full    Dental  (+) Teeth Intact, Dental Advisory Given   Pulmonary sleep apnea , former smoker,    breath sounds clear to auscultation       Cardiovascular hypertension, Pt. on medications  Rhythm:Regular Rate:Normal     Neuro/Psych Seizures -,   Neuromuscular disease CVA negative psych ROS   GI/Hepatic negative GI ROS, Neg liver ROS,   Endo/Other  negative endocrine ROS  Renal/GU negative Renal ROS     Musculoskeletal  (+) Arthritis ,   Abdominal Normal abdominal exam  (+)   Peds  Hematology negative hematology ROS (+)   Anesthesia Other Findings   Reproductive/Obstetrics                            Anesthesia Physical Anesthesia Plan  ASA: III  Anesthesia Plan: General   Post-op Pain Management:    Induction: Intravenous  PONV Risk Score and Plan: 3 and Ondansetron, Dexamethasone and Treatment may vary due to age or medical condition  Airway Management Planned: Oral ETT  Additional Equipment: None  Intra-op Plan:   Post-operative Plan: Extubation in OR  Informed Consent: I have reviewed the patients History and Physical, chart, labs and discussed the procedure including the risks, benefits and alternatives for the proposed anesthesia with the patient or authorized representative who has indicated his/her understanding and acceptance.     Dental advisory given  Plan Discussed with: CRNA  Anesthesia Plan Comments:        Anesthesia Quick Evaluation

## 2019-11-06 NOTE — H&P (Signed)
Subjective: The patient is a 74 year old white male who has complained of back and left great and right leg pain consistent with neurogenic claudication.  He has failed medical management.  He was worked up with a lumbar MRI which demonstrated severe stenosis and a spondylolisthesis at L4-5.  I discussed the various treatment options with him.  He has decided to proceed with surgery.  Past Medical History:  Diagnosis Date  . Arthritis    Left hand  . BPH with urinary obstruction    sees Dr. Diona Fanti  . Cancer ([place])    skin cancers removed NON melanoma  . Complication of anesthesia    dysuria  . 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 History of dysuria    post surgical  . Hyperlipidemia   . Hypertension    echo done - 10/10  . Inguinal hernia   . 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 Stroke [facility])    2010, treated at [facility] for evacuation of [facility], then rehabed at [name], no residual deficits     Past Surgical History:  Procedure Laterality Date  . ANTERIOR CERVICAL DECOMP/DISCECTOMY FUSION N/A 11/15/2012   Procedure: ANTERIOR CERVICAL DECOMPRESSION/DISCECTOMY FUSION 1 LEVEL,CERVICAL FOUR-FIVE;  Surgeon: Ophelia Charter, MD;  Location: [facility];  Service: Neurosurgery;  Laterality: N/A;  . BRAIN HEMATOMA EVACUATION Left 2010  . BRAIN SURGERY     bleeding on brain  . CERVICAL FUSION  2007   2 surgeries  . COLONOSCOPY  2006   per Dr. Timmothy Euler, clear. Negative Cologuard 04-28-17    . CYSTOSCOPY WITH INSERTION OF UROLIFT N/A 07/25/2016   Procedure: CYSTOSCOPY WITH INSERTION OF UROLIFT x4;  Surgeon: Franchot Gallo, MD;  Location: [facility];  Service: Urology;  Laterality: N/A;  . HERNIA REPAIR Bilateral    inguinal hernia - repair, [facility]- 20 yrs ago    . [place] evacuation  2010  . INGUINAL HERNIA REPAIR  08/11/2011   Procedure: LAPAROSCOPIC INGUINAL HERNIA;   Surgeon: Madilyn Hook, DO;  Location: [name];  Service: General;  Laterality: Right;  . INGUINAL HERNIA REPAIR    . KNEE SURGERY    . MOLE REMOVAL     non melanoma  . PILONIDAL CYST EXCISION    . TONSILLECTOMY      Allergies  Allergen Reactions  . Dilantin [Phenytoin] Other (See Comments)    "felt weird"    Social History   Tobacco Use  . Smoking status: Former Smoker    Packs/day: 0.50    Years: 20.00    Pack years: 10.00    Types: Cigarettes    Quit date: 08/08/1996    Years since quitting: 23.2  . Smokeless tobacco: Never Used  Substance Use Topics  . Alcohol use: Yes    Alcohol/week: 0.0 standard drinks    Comment: glass of wine each night    Family History  Problem Relation Age of Onset  . Stroke Father   . Lung cancer Father   . Anesthesia problems Neg Hx    Prior to Admission medications   Medication Sig Start Date End Date Taking? Authorizing Provider  levETIRAcetam (KEPPRA) 500 MG tablet Take 1 tablet (500 mg total) by mouth every 12 (twelve) hours. 04/23/19  Yes [name], MD  lisinopril (ZESTRIL) 20 MG tablet Take 1 tablet (20  mg total) by mouth daily. 04/23/19  Yes [name], MD  rosuvastatin (CRESTOR) 5 MG tablet Take 1 tablet (5 mg total) by mouth daily. 04/23/19  Yes [name], MD  temazepam (RESTORIL) 30 MG capsule TAKE 1 CAPSULE BY MOUTH EVERY DAY AT BEDTIME AS NEEDED Patient taking differently: Take 30 mg by mouth at bedtime as needed for sleep.  10/22/19  Yes [name], MD  ALPRAZolam Duanne Moron) 0.25 MG tablet Take 1 tablet (0.25 mg total) by mouth 2 (two) times daily as needed for anxiety. Patient not taking: Reported on 10/23/2019 04/23/19   [name], MD  colchicine 0.6 MG tablet Take 1 tablet (0.6 mg total) by mouth 2 (two) times daily. Patient not taking: Reported on 10/23/2019 04/23/19   [name], MD  diclofenac (VOLTAREN) 75 MG EC tablet Take 75 mg by mouth 2 (two) times daily as needed (gout pain).    [provider]   diphenoxylate-atropine (LOMOTIL) 2.5-0.025 MG tablet Take 2 tablets by mouth 4 (four) times daily as needed for diarrhea or loose stools. Patient not taking: Reported on 10/23/2019 06/17/19   [name], MD  indomethacin (INDOCIN) 50 MG capsule Take 1 capsule (50 mg total) by mouth every 8 (eight) hours as needed for moderate pain. Patient not taking: Reported on 10/23/2019 05/09/19   [name], MD  sildenafil (REVATIO) 20 MG tablet Take 2 tablets (40 mg total) by mouth as needed. 04/23/19   [name], MD     Review of Systems  Positive ROS: As above  All other systems have been reviewed and were otherwise negative with the exception of those mentioned in the HPI and as above.  Objective: Vital signs in last 24 hours: Temp:  [98 F (36.7 C)] 98 F (36.7 C) (08/11 0857) Pulse Rate:  [74] 74 (08/11 0857) Resp:  [20] 20 (08/11 0859) BP: (173)/(99) 173/99 (08/11 0857) SpO2:  [99 %] 99 % (08/11 0857) Weight:  [73.3 kg] 73.3 kg (08/11 0857) Estimated body mass index is 23.86 kg/m as calculated from the following:   Height as of this encounter: 5\' 9"  (1.753 m).   Weight as of this encounter: 73.3 kg.   General Appearance: Alert Head: Normocephalic, without obvious abnormality, atraumatic Eyes: PERRL, conjunctiva/corneas clear, EOM's intact,    Ears: Normal  Throat: Normal  Neck: Supple, Back: unremarkable Lungs: Clear to auscultation bilaterally, respirations unlabored Heart: Regular rate and rhythm, no murmur, rub or gallop Abdomen: Soft, non-tender Extremities: Extremities normal, atraumatic, no cyanosis or edema Skin: unremarkable  NEUROLOGIC:   Mental status: alert and oriented,Motor Exam - grossly normal Sensory Exam - grossly normal Reflexes:  Coordination - grossly normal Gait - grossly normal Balance - grossly normal Cranial Nerves: I: smell Not tested  II: visual acuity  OS: Normal  OD: Normal   II: visual fields Full to confrontation  II: pupils  Equal, round, reactive to light  III,VII: ptosis None  III,IV,VI: extraocular muscles  Full ROM  V: mastication Normal  V: facial light touch sensation  Normal  V,VII: corneal reflex  Present  VII: facial muscle function - upper  Normal  VII: facial muscle function - lower Normal  VIII: hearing Not tested  IX: soft palate elevation  Normal  IX,X: gag reflex Present  XI: trapezius strength  5/5  XI: sternocleidomastoid strength 5/5  XI: neck flexion strength  5/5  XII: tongue strength  Normal    Data Review Lab Results  Component Value Date   WBC 5.7 11/04/2019   HGB 15.7 11/04/2019   HCT 47.5 11/04/2019   MCV 93.0 11/04/2019   PLT 209 11/04/2019   Lab Results  Component Value Date   NA 141 11/04/2019   K 4.1 11/04/2019   CL 103 11/04/2019   CO2 28 11/04/2019   BUN 16 11/04/2019   CREATININE 1.22 11/04/2019   GLUCOSE 78 11/04/2019   Lab Results  Component Value Date   INR 1.04 01/16/2009    Assessment/Plan: L4-5 spinal listhesis, spinal stenosis, lumbago, lumbar radiculopathy, neurogenic claudication: I have discussed the situation with the patient.  I have reviewed his imaging studies with him and pointed out the abnormalities.  We have discussed the various treatment options including surgery.  I have described the surgical treatment option of an L4-5 decompression, instrumentation and fusion.  I have shown him surgical models.  I have given him a surgical pamphlet.  We have discussed the risk, benefits, alternatives, expected postoperative course, and likelihood of achieving our goals with surgery.  I have answered all his questions.  He has decided proceed with surgery.   Ophelia Charter 11/06/2019 10:16 AM

## 2019-11-06 NOTE — Anesthesia Postprocedure Evaluation (Signed)
Anesthesia Post Note  Patient: Derek Cox  Procedure(s) Performed: POSTERIOR LUMBAR INTERBODY FUSION, INTERBODY PROSTHESIS, POSTERIOR INSTRUMENTATION LUMBAR FOUR-LUMBAR FIVE (N/A Spine Lumbar)     Patient location during evaluation: PACU Anesthesia Type: General Level of consciousness: awake and alert Pain management: pain level controlled Vital Signs Assessment: post-procedure vital signs reviewed and stable Respiratory status: spontaneous breathing, nonlabored ventilation, respiratory function stable and patient connected to nasal cannula oxygen Cardiovascular status: blood pressure returned to baseline and stable Postop Assessment: no apparent nausea or vomiting Anesthetic complications: no   No complications documented.               Effie Berkshire

## 2019-11-06 NOTE — Anesthesia Procedure Notes (Signed)
Procedure Name: Intubation Date/Time: 11/06/2019 10:31 AM Performed by: Wilburn Cornelia, CRNA Pre-anesthesia Checklist: Patient identified, Emergency Drugs available, Suction available, Patient being monitored and Timeout performed Patient Re-evaluated:Patient Re-evaluated prior to induction Oxygen Delivery Method: Circle system utilized Preoxygenation: Pre-oxygenation with 100% oxygen Induction Type: IV induction Ventilation: Mask ventilation without difficulty Laryngoscope Size: Mac and 4 Grade View: Grade I Tube size: 7.5 mm Number of attempts: 1 Airway Equipment and Method: Stylet Placement Confirmation: ETT inserted through vocal cords under direct vision,  positive ETCO2 and CO2 detector Secured at: 23 cm Tube secured with: Tape Dental Injury: Teeth and Oropharynx as per pre-operative assessment

## 2019-11-07 LAB — BASIC METABOLIC PANEL
Anion gap: 9 (ref 5–15)
BUN: 15 mg/dL (ref 8–23)
CO2: 26 mmol/L (ref 22–32)
Calcium: 8.2 mg/dL — ABNORMAL LOW (ref 8.9–10.3)
Chloride: 101 mmol/L (ref 98–111)
Creatinine, Ser: 1.29 mg/dL — ABNORMAL HIGH (ref 0.61–1.24)
GFR calc Af Amer: >60 mL/min (ref >60)
GFR calc non Af Amer: 54 mL/min — ABNORMAL LOW (ref >60)
Glucose, Bld: 141 mg/dL — ABNORMAL HIGH (ref 70–99)
Potassium: 3.7 mmol/L (ref 3.5–5.1)
Sodium: 136 mmol/L (ref 135–145)

## 2019-11-07 LAB — CBC
HCT: 36 % — ABNORMAL LOW (ref 39.0–52.0)
Hemoglobin: 11.9 g/dL — ABNORMAL LOW (ref 13.0–17.0)
MCH: 30.6 pg (ref 26.0–34.0)
MCHC: 33.1 g/dL (ref 30.0–36.0)
MCV: 92.5 fL (ref 80.0–100.0)
Platelets: 158 10*3/uL (ref 150–400)
RBC: 3.89 MIL/uL — ABNORMAL LOW (ref 4.22–5.81)
RDW: 12.4 % (ref 11.5–15.5)
WBC: 10.5 10*3/uL (ref 4.0–10.5)
nRBC: 0 % (ref 0.0–0.2)

## 2019-11-07 MED ORDER — OXYCODONE-ACETAMINOPHEN 5-325 MG PO TABS: 1.0000 | ORAL_TABLET | ORAL | 0 refills | Status: DC | PRN

## 2019-11-07 MED ORDER — TAMSULOSIN HCL 0.4 MG PO CAPS: 0.4000 mg | ORAL_CAPSULE | Freq: Every day | ORAL | 0 refills | Status: DC

## 2019-11-07 MED ORDER — CYCLOBENZAPRINE HCL 10 MG PO TABS: 10.0000 mg | ORAL_TABLET | Freq: Three times a day (TID) | ORAL | 0 refills | Status: DC | PRN

## 2019-11-07 MED ORDER — OXYCODONE-ACETAMINOPHEN 5-325 MG PO TABS: 1.0000 | ORAL_TABLET | ORAL | Status: DC | PRN

## 2019-11-07 MED ORDER — DOCUSATE SODIUM 100 MG PO CAPS: 100.0000 mg | ORAL_CAPSULE | Freq: Two times a day (BID) | ORAL | 0 refills | Status: DC

## 2019-11-07 NOTE — Care Management Obs Status (Signed)
[facility] NOTIFICATION   Patient Details  Name: Derek Cox MRN: [id number] Date of Birth: 01/02/1946   Medicare Observation Status Notification Given:  Yes  Patient unable to sign. Mailing notice to patient.    Benard Halsted, LCSW 11/07/2019, 11:54 AM

## 2019-11-07 NOTE — Plan of Care (Signed)
Patient alert and oriented, mae's well, voiding adequate amount of urine, swallowing without difficulty, no c/o pain at time of discharge. Patient discharged home with family. Script and discharged instructions given to patient. Patient and family stated understanding of instructions given. Patient has an appointment with Dr. Jenkins   

## 2019-11-07 NOTE — Evaluation (Signed)
Occupational Therapy Evaluation Patient Details Name: Derek Cox MRN: [id number] DOB: 1945-05-11 Today's Date: 11/07/2019    History of Present Illness 74 y.o male s/p L4-5 PLIF. PMH includes Guillan Barre, CVA, seizure, knee sx, ACDF sx.   Clinical Impression   PTA pt living with spouse and functioning at mod I- supervision level. Wife present and very supportive in care. She states he has some STM deficits at baseline from prior CVA and she assists in compensating for this. At time of eval, pt presents with ability to complete sit <> stands at mod I level with RW. Pt is currently completing BADL at mod I- min A level, but denies further education on AE and prefers spouse to assist. Back handout provided and reviewed adls in detail with both pt and wife. Pt educated on: clothing between brace, never sleep in brace, set an alarm at night for medication, avoid sitting for long periods of time, correct bed positioning for sleeping, correct sequence for bed mobility, avoiding lifting more than 5 pounds and never wash directly over incision. Reinforced following precautions with pt typical routine. All education is complete and patient indicates understanding. No further OT follow up is indicated, OT will sign off. Thank you for this consult.    Follow Up Recommendations  No OT follow up;Supervision/Assistance - 24 hour    Equipment Recommendations  None recommended by OT    Recommendations for Other Services       Precautions / Restrictions Precautions Precautions: Fall;Back Precaution Booklet Issued: Yes (comment) Precaution Comments: reviewed precautions with pt and wife Required Braces or Orthoses: Spinal Brace Spinal Brace: Lumbar corset Restrictions Weight Bearing Restrictions: No      Mobility Bed Mobility               General bed mobility comments: up in chair  Transfers Overall transfer level: Modified independent Equipment used: Rolling walker (2 wheeled)                   Balance Overall balance assessment: Mild deficits observed, not formally tested                                         ADL either performed or assessed with clinical judgement   ADL Overall ADL's : Modified independent                                       General ADL Comments: Pt demonstrates ability to complete BADL at mod I level. Pt completed functional mobility beyond a household distance without external assist as well as toilet transfer. Wife prefers to assist pt in dressing, is very involved in care. REviewed figure 4 method as needed with both pt and wife. Wife states she plans to be around 24/7 to ensure pt remembers to maintain precautions     Vision Baseline Vision/History: Wears glasses Wears Glasses: At all times Patient Visual Report: No change from baseline       Perception     Praxis      Pertinent Vitals/Pain Pain Assessment: 0-10 Pain Score: 7  Pain Descriptors / Indicators: Aching;Sore Pain Intervention(s): Monitored during session;Repositioned     Hand Dominance     Extremity/Trunk Assessment Upper Extremity Assessment Upper Extremity Assessment: Overall WFL for tasks assessed   Lower Extremity  Assessment Lower Extremity Assessment: Defer to PT evaluation       Communication Communication Communication: No difficulties   Cognition Arousal/Alertness: Awake/alert Behavior During Therapy: WFL for tasks assessed/performed Overall Cognitive Status: History of cognitive impairments - at baseline                                 General Comments: wife endorses STM deficits at baseline from previous CVA.   General Comments       Exercises     Shoulder Instructions      Home Living Family/patient expects to be discharged to:: Private residence Living Arrangements: Spouse/significant other Available Help at Discharge: Family;Available 24 hours/day Type of Home: House Home  Access: Stairs to enter [employer] of Steps: 3   Home Layout: One level     Bathroom Shower/Tub: Occupational psychologist: Handicapped height     Home Equipment: Environmental consultant - 2 wheels          Prior Functioning/Environment Level of Independence: Independent        Comments: was using RW up intil surgery, but otherwise is independent. Wife does reports som STM deficits from previous stroke and is very inolved in pts care        OT Problem List: Decreased knowledge of use of DME or AE;Decreased knowledge of precautions;Pain      OT Treatment/Interventions:      OT Goals(Current goals can be found in the care plan section) Acute Rehab OT Goals Patient Stated Goal: return to riding tractor and working in yard OT Goal Formulation: All assessment and education complete, DC therapy  OT Frequency:     Barriers to D/C:            Co-evaluation              AM-PAC OT "6 Clicks" Daily Activity     Outcome Measure Help from another person eating meals?: None Help from another person taking care of personal grooming?: None Help from another person toileting, which includes using toliet, bedpan, or urinal?: None Help from another person bathing (including washing, rinsing, drying)?: A Little Help from another person to put on and taking off regular upper body clothing?: None Help from another person to put on and taking off regular lower body clothing?: A Little 6 Click Score: 22   End of Session Equipment Utilized During Treatment: Gait belt;Rolling walker;Back brace Nurse Communication: Mobility status  Activity Tolerance: Patient tolerated treatment well Patient left: in chair;with call bell/phone within reach;with family/visitor present  OT Visit Diagnosis: Unsteadiness on feet (R26.81);Other abnormalities of gait and mobility (R26.89);Pain Pain - part of body:  (back)                Time: 5397-6734 OT Time Calculation (min): 24 min Charges:   OT General Charges $OT Visit: 1 Visit OT Evaluation $OT Eval Low Complexity: 1 Low OT Treatments $Self Care/Home Management : 8-22 mins  Zenovia Jarred, MSOT, OTR/L [facility] Office Number: [phone] Pager: [phone]  Zenovia Jarred 11/07/2019, 10:01 AM

## 2019-11-07 NOTE — Care Management CC44 (Signed)
Condition Code 44 Documentation Completed  Patient Details  Name: Derek Cox MRN: [id number] Date of Birth: Oct 04, 1945   Condition Code 44 given:  Yes Patient signature on Condition Code 44 notice:  Yes Documentation of 2 MD's agreement:  Yes Code 44 added to claim:  Yes    Benard Halsted, LCSW 11/07/2019, 11:55 AM

## 2019-11-07 NOTE — Evaluation (Signed)
Physical Therapy Evaluation Patient Details Name: Derek Cox MRN: [id number] DOB: 1945/08/29 Today's Date: 11/07/2019   History of Present Illness  74 y.o male s/p L4-5 PLIF. PMH includes Guillan Barre, CVA, seizure, knee sx, ACDF sx.  Clinical Impression  Pt admitted with above diagnosis. At the time of PT eval, pt was able to demonstrate transfers and ambulation with up to supervision for safety and RW for support. Wife present for education as she is primary caregiver and was engaged throughout session, asking appropriate questions and confirming her understanding of education. Pt and wife were educated on precautions, brace application/wearing schedule, appropriate activity progression, and car transfer. Pt currently with functional limitations due to the deficits listed below (see PT Problem List). Pt will benefit from skilled PT to increase their independence and safety with mobility to allow discharge to the venue listed below.      Follow Up Recommendations No PT follow up;Supervision for mobility/OOB    Equipment Recommendations  None recommended by PT    Recommendations for Other Services       Precautions / Restrictions Precautions Precautions: Fall;Back Precaution Booklet Issued: Yes (comment) Precaution Comments: reviewed precautions with pt and wife Required Braces or Orthoses: Spinal Brace Spinal Brace: Lumbar corset Restrictions Weight Bearing Restrictions: No      Mobility  Bed Mobility Overal bed mobility: Modified Independent             General bed mobility comments: VC's for proper log roll technique. Pt was able to transition to EOB with HOB flat and rails lowered to simulate home environment.   Transfers Overall transfer level: Modified independent Equipment used: Rolling walker (2 wheeled)             General transfer comment: VC's for hand placement on seated surface for safety.   Ambulation/Gait Ambulation/Gait assistance:  Supervision Gait Distance (Feet): 300 Feet Assistive device: Rolling walker (2 wheeled) Gait Pattern/deviations: Step-through pattern;Decreased stride length;Trunk flexed Gait velocity: Decreased Gait velocity interpretation: <1.31 ft/sec, indicative of household ambulator General Gait Details: VC's for improved posture and closer walker proximity.   Stairs Stairs: Yes   Stair Management: One rail Right;Step to pattern;Alternating pattern;Forwards Number of Stairs: 4 General stair comments: VC's for sequencing and general safety. Pt was able to progress to alernating step pattern by end of stair training.   Wheelchair Mobility    Modified Rankin (Stroke Patients Only)       Balance Overall balance assessment: Mild deficits observed, not formally tested                                           Pertinent Vitals/Pain Pain Assessment: Faces Pain Score: 7  Faces Pain Scale: Hurts a little bit Pain Descriptors / Indicators: Aching;Sore Pain Intervention(s): Limited activity within patient's tolerance;Monitored during session;Repositioned    Home Living Family/patient expects to be discharged to:: Private residence Living Arrangements: Spouse/significant other Available Help at Discharge: Family;Available 24 hours/day Type of Home: House Home Access: Stairs to enter   [employer] of Steps: 3 Home Layout: One level Home Equipment: Walker - 2 wheels      Prior Function Level of Independence: Independent         Comments: was using RW up intil surgery, but otherwise is independent. Wife does reports som STM deficits from previous stroke and is very inolved in pts care  Hand Dominance        Extremity/Trunk Assessment   Upper Extremity Assessment Upper Extremity Assessment: Overall WFL for tasks assessed    Lower Extremity Assessment Lower Extremity Assessment: Generalized weakness    Cervical / Trunk Assessment Cervical /  Trunk Assessment: Other exceptions Cervical / Trunk Exceptions: s/p susrgery  Communication   Communication: No difficulties  Cognition Arousal/Alertness: Awake/alert Behavior During Therapy: WFL for tasks assessed/performed Overall Cognitive Status: History of cognitive impairments - at baseline                                 General Comments: wife endorses STM deficits at baseline from previous CVA.      General Comments      Exercises     Assessment/Plan    PT Assessment Patient needs continued PT services  PT Problem List Decreased strength;Decreased activity tolerance;Decreased balance;Decreased mobility;Decreased coordination;Decreased cognition;Decreased knowledge of use of DME;Decreased safety awareness;Decreased knowledge of precautions;Pain       PT Treatment Interventions DME instruction;Gait training;Stair training;Functional mobility training;Therapeutic activities;Therapeutic exercise;Neuromuscular re-education;Patient/family education;Cognitive remediation    PT Goals (Current goals can be found in the Care Plan section)  Acute Rehab PT Goals Patient Stated Goal: return to riding tractor and working in yard PT Goal Formulation: With patient Time For Goal Achievement: 11/14/19 Potential to Achieve Goals: Good    Frequency Min 5X/week   Barriers to discharge        Co-evaluation               AM-PAC PT "6 Clicks" Mobility  Outcome Measure Help needed turning from your back to your side while in a flat bed without using bedrails?: None Help needed moving from lying on your back to sitting on the side of a flat bed without using bedrails?: None Help needed moving to and from a bed to a chair (including a wheelchair)?: A Little Help needed standing up from a chair using your arms (e.g., wheelchair or bedside chair)?: A Little Help needed to walk in hospital room?: A Little Help needed climbing 3-5 steps with a railing? : A Little 6  Click Score: 20    End of Session Equipment Utilized During Treatment: Gait belt Activity Tolerance: Patient tolerated treatment well Patient left: with call bell/phone within reach;with family/visitor present;in chair Nurse Communication: Mobility status PT Visit Diagnosis: Unsteadiness on feet (R26.81)    Time: 8372-9021 PT Time Calculation (min) (ACUTE ONLY): 32 min   Charges:   PT Evaluation $PT Eval Moderate Complexity: 1 Mod PT Treatments $Gait Training: 8-22 mins        Rolinda Roan, PT, DPT Acute Rehabilitation Services Pager: [phone] Office: [phone]   Thelma Comp 11/07/2019, 11:30 AM

## 2019-11-07 NOTE — Discharge Summary (Signed)
Physician Discharge Summary  Patient ID: Derek Cox MRN: [id number] DOB/AGE: 76-10-1945 76 y.o.  Admit date: 11/06/2019 Discharge date: 11/07/2019  Admission Diagnoses: Lumbar spondylolisthesis, lumbar spinal stenosis, lumbago, lumbar radiculopathy, neurogenic claudication  Discharge Diagnoses: The same Active Problems:   Spondylolisthesis of lumbar region   Discharged Condition: good  Hospital Course: I performed an L4-5 decompression, instrumentation and fusion on the patient on 11/06/2019.  The surgery went well.  The patient's postoperative course was unremarkable except for urinary retention.  He was started on Flomax and the urinary retention resolved.  On postoperative day #1 the patient, and his wife, requested discharge to home.  They were given written and oral discharge instructions.  All their questions were answered.  Consults: PT, OT, care management Significant Diagnostic Studies: None Treatments: L4-5 decompression, instrumentation and fusion. Discharge Exam: Blood pressure 90/60, pulse 87, temperature 98.3 F (36.8 C), temperature source Oral, resp. rate 16, height 5\' 9"  (1.753 m), weight 73.3 kg, SpO2 95 %. The patient is alert and pleasant.  He looks well.  His lower extremity strength is grossly normal.  Disposition: Home  Discharge Instructions    Call MD for:  difficulty breathing, headache or visual disturbances   Complete by: As directed    Call MD for:  extreme fatigue   Complete by: As directed    Call MD for:  hives   Complete by: As directed    Call MD for:  persistant dizziness or light-headedness   Complete by: As directed    Call MD for:  persistant nausea and vomiting   Complete by: As directed    Call MD for:  redness, tenderness, or signs of infection (pain, swelling, redness, odor or green/yellow discharge around incision site)   Complete by: As directed    Call MD for:  severe uncontrolled pain   Complete by: As directed    Call  MD for:  temperature >100.4   Complete by: As directed    Diet - low sodium heart healthy   Complete by: As directed    Discharge instructions   Complete by: As directed    Call [phone] for a followup appointment. Take a stool softener while you are using pain medications.   Driving Restrictions   Complete by: As directed    Do not drive for 2 weeks.   Increase activity slowly   Complete by: As directed    Lifting restrictions   Complete by: As directed    Do not lift more than 5 pounds. No excessive bending or twisting.   May shower / Bathe   Complete by: As directed    Remove the dressing for 3 days after surgery.  You may shower, but leave the incision alone.   Remove dressing in 48 hours   Complete by: As directed      Allergies as of 11/07/2019      Reactions   Dilantin [phenytoin] Other (See Comments)   "felt weird"      Medication List    STOP taking these medications   ALPRAZolam 0.25 MG tablet Commonly known as: XANAX   colchicine 0.6 MG tablet   diclofenac 75 MG EC tablet Commonly known as: VOLTAREN   diphenoxylate-atropine 2.5-0.025 MG tablet Commonly known as: Lomotil   indomethacin 50 MG capsule Commonly known as: INDOCIN     TAKE these medications   cyclobenzaprine 10 MG tablet Commonly known as: FLEXERIL Take 1 tablet (10 mg total) by mouth 3 (three) times daily as  needed for muscle spasms.   docusate sodium 100 MG capsule Commonly known as: COLACE Take 1 capsule (100 mg total) by mouth 2 (two) times daily.   levETIRAcetam 500 MG tablet Commonly known as: KEPPRA Take 1 tablet (500 mg total) by mouth every 12 (twelve) hours.   lisinopril 20 MG tablet Commonly known as: ZESTRIL Take 1 tablet (20 mg total) by mouth daily.   oxyCODONE-acetaminophen 5-325 MG tablet Commonly known as: PERCOCET/ROXICET Take 1-2 tablets by mouth every 4 (four) hours as needed for moderate pain.   rosuvastatin 5 MG tablet Commonly known as: Crestor Take 1  tablet (5 mg total) by mouth daily.   sildenafil 20 MG tablet Commonly known as: REVATIO Take 2 tablets (40 mg total) by mouth as needed.   tamsulosin 0.4 MG Caps capsule Commonly known as: FLOMAX Take 1 capsule (0.4 mg total) by mouth daily. Start taking on: November 08, 2019   temazepam 30 MG capsule Commonly known as: RESTORIL TAKE 1 CAPSULE BY MOUTH EVERY DAY AT BEDTIME AS NEEDED What changed: See the new instructions.       Follow-up Information    Newman Pies, MD Follow up.   Specialty: Neurosurgery Contact information: [address] [place] [address] [phone]               Signed: Ophelia Charter 11/07/2019, 11:10 AM

## 2019-11-11 MED FILL — Sodium Chloride IV Soln 0.9%: INTRAVENOUS | Qty: 1000 | Status: AC

## 2019-11-11 MED FILL — Heparin Sodium (Porcine) Inj 1000 Unit/ML: INTRAMUSCULAR | Qty: 30 | Status: AC

## 2019-11-22 DIAGNOSIS — I10 Essential (primary) hypertension: Secondary | ICD-10-CM | POA: Diagnosis not present

## 2019-11-22 DIAGNOSIS — M48062 Spinal stenosis, lumbar region with neurogenic claudication: Secondary | ICD-10-CM | POA: Diagnosis not present

## 2019-12-16 ENCOUNTER — Other Ambulatory Visit: Payer: Self-pay | Admitting: Family Medicine

## 2019-12-18 NOTE — Telephone Encounter (Signed)
Pt is calling in stating that he has been out of his Rx tamsulosin [facility]) and has not had it for several days.  Pharm:  [facility] on [address]

## 2020-01-07 DIAGNOSIS — L821 Other seborrheic keratosis: Secondary | ICD-10-CM | POA: Diagnosis not present

## 2020-01-07 DIAGNOSIS — L570 Actinic keratosis: Secondary | ICD-10-CM | POA: Diagnosis not present

## 2020-01-07 DIAGNOSIS — L111 Transient acantholytic dermatosis [Grover]: Secondary | ICD-10-CM | POA: Diagnosis not present

## 2020-01-07 DIAGNOSIS — D1801 Hemangioma of skin and subcutaneous tissue: Secondary | ICD-10-CM | POA: Diagnosis not present

## 2020-01-07 DIAGNOSIS — D225 Melanocytic nevi of trunk: Secondary | ICD-10-CM | POA: Diagnosis not present

## 2020-01-07 DIAGNOSIS — Z85828 Personal history of other malignant neoplasm of skin: Secondary | ICD-10-CM | POA: Diagnosis not present

## 2020-01-07 DIAGNOSIS — L814 Other melanin hyperpigmentation: Secondary | ICD-10-CM | POA: Diagnosis not present

## 2020-01-09 ENCOUNTER — Other Ambulatory Visit: Payer: Self-pay | Admitting: Family Medicine

## 2020-01-09 NOTE — Telephone Encounter (Signed)
Patient called and advised of the need for a physical. He says he had back surgery and will not be released until mid December. Appointment scheduled for Tuesday, 03/31/20 at 1030. Advised refill will be sent in for 90 days.

## 2020-02-04 DIAGNOSIS — M48062 Spinal stenosis, lumbar region with neurogenic claudication: Secondary | ICD-10-CM | POA: Diagnosis not present

## 2020-02-09 ENCOUNTER — Other Ambulatory Visit: Payer: Self-pay | Admitting: Family Medicine

## 2020-03-31 ENCOUNTER — Encounter: Payer: Medicare Other | Admitting: Family Medicine

## 2020-04-02 ENCOUNTER — Other Ambulatory Visit: Payer: Self-pay | Admitting: *Deleted

## 2020-04-02 MED ORDER — TAMSULOSIN HCL 0.4 MG PO CAPS
0.4000 mg | ORAL_CAPSULE | Freq: Every day | ORAL | 0 refills | Status: DC
Start: 2020-04-02 — End: 2020-04-09

## 2020-04-02 NOTE — Telephone Encounter (Signed)
Rx done. 

## 2020-04-07 DIAGNOSIS — L570 Actinic keratosis: Secondary | ICD-10-CM | POA: Diagnosis not present

## 2020-04-07 DIAGNOSIS — D485 Neoplasm of uncertain behavior of skin: Secondary | ICD-10-CM | POA: Diagnosis not present

## 2020-04-07 DIAGNOSIS — L820 Inflamed seborrheic keratosis: Secondary | ICD-10-CM | POA: Diagnosis not present

## 2020-04-08 ENCOUNTER — Other Ambulatory Visit: Payer: Self-pay

## 2020-04-09 ENCOUNTER — Encounter: Payer: Self-pay | Admitting: Family Medicine

## 2020-04-09 ENCOUNTER — Ambulatory Visit (INDEPENDENT_AMBULATORY_CARE_PROVIDER_SITE_OTHER): Payer: Medicare Other | Admitting: Family Medicine

## 2020-04-09 VITALS — BP 140/80 | HR 78 | Ht 69.0 in | Wt 168.0 lb

## 2020-04-09 DIAGNOSIS — I10 Essential (primary) hypertension: Secondary | ICD-10-CM

## 2020-04-09 DIAGNOSIS — G4700 Insomnia, unspecified: Secondary | ICD-10-CM | POA: Diagnosis not present

## 2020-04-09 DIAGNOSIS — N138 Other obstructive and reflux uropathy: Secondary | ICD-10-CM | POA: Diagnosis not present

## 2020-04-09 DIAGNOSIS — N401 Enlarged prostate with lower urinary tract symptoms: Secondary | ICD-10-CM | POA: Diagnosis not present

## 2020-04-09 DIAGNOSIS — M4316 Spondylolisthesis, lumbar region: Secondary | ICD-10-CM

## 2020-04-09 DIAGNOSIS — E782 Mixed hyperlipidemia: Secondary | ICD-10-CM | POA: Diagnosis not present

## 2020-04-09 DIAGNOSIS — N529 Male erectile dysfunction, unspecified: Secondary | ICD-10-CM | POA: Diagnosis not present

## 2020-04-09 LAB — HEPATIC FUNCTION PANEL
ALT: 14 U/L (ref 0–53)
AST: 20 U/L (ref 0–37)
Albumin: 4.6 g/dL (ref 3.5–5.2)
Alkaline Phosphatase: 60 U/L (ref 39–117)
Bilirubin, Direct: 0.2 mg/dL (ref 0.0–0.3)
Total Bilirubin: 0.9 mg/dL (ref 0.2–1.2)
Total Protein: 6.8 g/dL (ref 6.0–8.3)

## 2020-04-09 LAB — CBC WITH DIFFERENTIAL/PLATELET
Basophils Absolute: 0 10*3/uL (ref 0.0–0.1)
Basophils Relative: 0.6 % (ref 0.0–3.0)
Eosinophils Absolute: 0.1 10*3/uL (ref 0.0–0.7)
Eosinophils Relative: 2 % (ref 0.0–5.0)
HCT: 44.8 % (ref 39.0–52.0)
Hemoglobin: 15.2 g/dL (ref 13.0–17.0)
Lymphocytes Relative: 27.6 % (ref 12.0–46.0)
Lymphs Abs: 1.5 10*3/uL (ref 0.7–4.0)
MCHC: 33.9 g/dL (ref 30.0–36.0)
MCV: 90.5 fl (ref 78.0–100.0)
Monocytes Absolute: 0.6 10*3/uL (ref 0.1–1.0)
Monocytes Relative: 10.7 % (ref 3.0–12.0)
Neutro Abs: 3.2 10*3/uL (ref 1.4–7.7)
Neutrophils Relative %: 59.1 % (ref 43.0–77.0)
Platelets: 213 10*3/uL (ref 150.0–400.0)
RBC: 4.95 Mil/uL (ref 4.22–5.81)
RDW: 12.9 % (ref 11.5–15.5)
WBC: 5.3 10*3/uL (ref 4.0–10.5)

## 2020-04-09 LAB — LIPID PANEL
Cholesterol: 186 mg/dL (ref 0–200)
HDL: 68.4 mg/dL (ref >39.00)
LDL Cholesterol: 97 mg/dL (ref 0–99)
NonHDL: 117.43
Total CHOL/HDL Ratio: 3
Triglycerides: 104 mg/dL (ref 0.0–149.0)
VLDL: 20.8 mg/dL (ref 0.0–40.0)

## 2020-04-09 LAB — PSA: PSA: 2.5 ng/mL (ref 0.10–4.00)

## 2020-04-09 LAB — TSH: TSH: 1.81 u[IU]/mL (ref 0.35–4.50)

## 2020-04-09 LAB — BASIC METABOLIC PANEL
BUN: 15 mg/dL (ref 6–23)
CO2: 30 mEq/L (ref 19–32)
Calcium: 9.3 mg/dL (ref 8.4–10.5)
Chloride: 104 mEq/L (ref 96–112)
Creatinine, Ser: 1.1 mg/dL (ref 0.40–1.50)
GFR: 65.36 mL/min (ref >60.00)
Glucose, Bld: 89 mg/dL (ref 70–99)
Potassium: 3.9 mEq/L (ref 3.5–5.1)
Sodium: 140 mEq/L (ref 135–145)

## 2020-04-09 MED ORDER — TEMAZEPAM 30 MG PO CAPS
ORAL_CAPSULE | ORAL | 5 refills | Status: DC
Start: 2020-04-09 — End: 2020-04-20

## 2020-04-09 MED ORDER — ROSUVASTATIN CALCIUM 5 MG PO TABS
5.0000 mg | ORAL_TABLET | Freq: Every day | ORAL | 3 refills | Status: DC
Start: 2020-04-09 — End: 2021-01-25

## 2020-04-09 MED ORDER — LEVETIRACETAM 500 MG PO TABS
500.0000 mg | ORAL_TABLET | Freq: Two times a day (BID) | ORAL | 3 refills | Status: DC
Start: 2020-04-09 — End: 2021-03-01

## 2020-04-09 MED ORDER — LISINOPRIL 20 MG PO TABS
20.0000 mg | ORAL_TABLET | Freq: Every day | ORAL | 3 refills | Status: DC
Start: 2020-04-09 — End: 2021-01-25

## 2020-04-09 NOTE — Progress Notes (Signed)
Subjective:    Patient ID: Derek Cox, male    DOB: 1946/03/15, 75 y.o.   MRN: [id number]  HPI Here to follow up on issues. He is doing well in general. He had lumbar spinal surgery last August and this went well. His BP is stable. He sleeps well.    Review of Systems  Constitutional: Negative.   HENT: Negative.   Eyes: Negative.   Respiratory: Negative.   Cardiovascular: Negative.   Gastrointestinal: Negative.   Genitourinary: Negative.   Musculoskeletal: Negative.   Skin: Negative.   Neurological: Negative.   Psychiatric/Behavioral: Negative.        Objective:   Physical Exam Constitutional:      General: He is not in acute distress.    Appearance: He is well-developed and well-nourished. He is not diaphoretic.  HENT:     Head: Normocephalic and atraumatic.     Right Ear: External ear normal.     Left Ear: External ear normal.     Nose: Nose normal.     Mouth/Throat:     Mouth: Oropharynx is clear and moist.     Pharynx: No oropharyngeal exudate.  Eyes:     General: No scleral icterus.       Right eye: No discharge.        Left eye: No discharge.     Extraocular Movements: EOM normal.     Conjunctiva/sclera: Conjunctivae normal.     Pupils: Pupils are equal, round, and reactive to light.  Neck:     Thyroid: No thyromegaly.     Vascular: No JVD.     Trachea: No tracheal deviation.  Cardiovascular:     Rate and Rhythm: Normal rate and regular rhythm.     Pulses: Intact distal pulses.     Heart sounds: Normal heart sounds. No murmur heard. No friction rub. No gallop.   Pulmonary:     Effort: Pulmonary effort is normal. No respiratory distress.     Breath sounds: Normal breath sounds. No wheezing or rales.  Chest:     Chest wall: No tenderness.  Abdominal:     General: Bowel sounds are normal. There is no distension.     Palpations: Abdomen is soft. There is no mass.     Tenderness: There is no abdominal tenderness. There is no guarding or rebound.   Genitourinary:    Penis: Normal. No tenderness.      Testes: Normal.     Prostate: Normal.     Rectum: Normal. Guaiac result negative.  Musculoskeletal:        General: No tenderness or edema. Normal range of motion.     Cervical back: Neck supple.  Lymphadenopathy:     Cervical: No cervical adenopathy.  Skin:    General: Skin is warm and dry.     Coloration: Skin is not pale.     Findings: No erythema or rash.  Neurological:     Mental Status: He is alert and oriented to person, place, and time.     Cranial Nerves: No cranial nerve deficit.     Motor: No abnormal muscle tone.     Coordination: Coordination normal.     Deep Tendon Reflexes: Reflexes are normal and symmetric. Reflexes normal.  Psychiatric:        Mood and Affect: Mood and affect normal.        Behavior: Behavior normal.        Thought Content: Thought content normal.  Judgment: Judgment normal.           Assessment & Plan:  His HTN is stable. His insomnia is stable. We will get fasting labs to check lipids, etc.  Alysia Penna, MD

## 2020-04-14 NOTE — Progress Notes (Signed)
Mychart message sent: All results are within normal limits

## 2020-04-20 ENCOUNTER — Other Ambulatory Visit: Payer: Self-pay | Admitting: Family Medicine

## 2020-04-20 MED ORDER — TEMAZEPAM 30 MG PO CAPS: ORAL_CAPSULE | ORAL | 5 refills | Status: DC

## 2020-04-20 NOTE — Telephone Encounter (Signed)
Done

## 2020-04-20 NOTE — Addendum Note (Signed)
Addended by: Alysia Penna A on: 04/20/2020 08:36 AM   Modules accepted: Orders

## 2020-04-22 ENCOUNTER — Other Ambulatory Visit: Payer: Self-pay | Admitting: Family Medicine

## 2020-06-10 ENCOUNTER — Telehealth (INDEPENDENT_AMBULATORY_CARE_PROVIDER_SITE_OTHER): Payer: Medicare Other | Admitting: Family Medicine

## 2020-06-10 ENCOUNTER — Encounter: Payer: Self-pay | Admitting: Family Medicine

## 2020-06-10 DIAGNOSIS — G4700 Insomnia, unspecified: Secondary | ICD-10-CM

## 2020-06-10 MED ORDER — ALPRAZOLAM 1 MG PO TABS: 1.0000 mg | ORAL_TABLET | Freq: Every evening | ORAL | 5 refills | Status: DC | PRN

## 2020-06-12 ENCOUNTER — Encounter: Payer: Self-pay | Admitting: Family Medicine

## 2020-06-12 NOTE — Progress Notes (Signed)
   Subjective:    Patient ID: Derek Cox, male    DOB: [birth year], 75 y.o.   MRN: [id number]  HPI Virtual Visit via Telephone Note  I connected with the patient on 06/12/20 at  3:45 PM EDT by telephone and verified that I am speaking with the correct person using two identifiers.   I discussed the limitations, risks, security and privacy concerns of performing an evaluation and management service by telephone and the availability of in person appointments. I also discussed with the patient that there may be a patient responsible charge related to this service. The patient expressed understanding and agreed to proceed.  Location patient: home Location provider: work or home office Participants present for the call: patient, provider Patient did not have a visit in the prior 7 days to address this/these issue(s).   History of Present Illness: Here for trouble with sleep. He has been taking Temazepam for sleep, and he does sleep well. However his wife says it makes him sleep walk all over he house. Of course he does not remember this.    Observations/Objective: Patient sounds cheerful and well on the phone. I do not appreciate any SOB. Speech and thought processing are grossly intact. Patient reported vitals:  Assessment and Plan: Insomnia. Stop Temazepam and try Xanax 1 mg at bedtime. Recheck as needed.  Alysia Penna, MD   Follow Up Instructions:     [phone] 5-10 [phone] 11-20 [id number] 21-30 I did not refer this patient for an OV in the next 24 hours for this/these issue(s).  I discussed the assessment and treatment plan with the patient. The patient was provided an opportunity to ask questions and all were answered. The patient agreed with the plan and demonstrated an understanding of the instructions.   The patient was advised to call back or seek an in-person evaluation if the symptoms worsen or if the condition fails to improve as anticipated.  I provided 11 minutes of  non-face-to-face time during this encounter.   Alysia Penna, MD    Review of Systems     Objective:   Physical Exam        Assessment & Plan:

## 2020-07-13 DIAGNOSIS — I10 Essential (primary) hypertension: Secondary | ICD-10-CM | POA: Diagnosis not present

## 2020-07-13 DIAGNOSIS — M545 Low back pain, unspecified: Secondary | ICD-10-CM | POA: Diagnosis not present

## 2020-07-18 ENCOUNTER — Other Ambulatory Visit: Payer: Self-pay | Admitting: Family Medicine

## 2020-08-04 DIAGNOSIS — M545 Low back pain, unspecified: Secondary | ICD-10-CM | POA: Diagnosis not present

## 2020-08-04 DIAGNOSIS — I10 Essential (primary) hypertension: Secondary | ICD-10-CM | POA: Diagnosis not present

## 2020-08-04 DIAGNOSIS — Z6824 Body mass index (BMI) 24.0-24.9, adult: Secondary | ICD-10-CM | POA: Diagnosis not present

## 2020-08-04 DIAGNOSIS — G8929 Other chronic pain: Secondary | ICD-10-CM | POA: Diagnosis not present

## 2020-08-11 DIAGNOSIS — D485 Neoplasm of uncertain behavior of skin: Secondary | ICD-10-CM | POA: Diagnosis not present

## 2020-08-11 DIAGNOSIS — L739 Follicular disorder, unspecified: Secondary | ICD-10-CM | POA: Diagnosis not present

## 2020-08-11 DIAGNOSIS — L570 Actinic keratosis: Secondary | ICD-10-CM | POA: Diagnosis not present

## 2020-08-26 ENCOUNTER — Telehealth: Payer: Self-pay | Admitting: Family Medicine

## 2020-08-26 NOTE — Chronic Care Management (AMB) (Signed)
  Chronic Care Management   Note  08/26/2020 Name: Jefferson Fullam MRN: [id number] DOB: [birth year]  Willi Srinivas Lippman is a 75 y.o. year old male who is a primary care patient of [name], MD. I reached out to Lenore Manner by phone today in response to a referral sent by Mr. Bodi Truman Hayward Miguez's PCP, [name], MD.   Mr. Mackins was given information about Chronic Care Management services today including:  1. CCM service includes personalized support from designated clinical staff supervised by his physician, including individualized plan of care and coordination with other care providers 2. 24/7 contact phone numbers for assistance for urgent and routine care needs. 3. Service will only be billed when office clinical staff spend 20 minutes or more in a month to coordinate care. 4. Only one practitioner may furnish and bill the service in a calendar month. 5. The patient may stop CCM services at any time (effective at the end of the month) by phone call to the office staff.   Patient agreed to services and verbal consent obtained.   Follow up plan:   Tatjana Secretary/administrator

## 2020-09-21 ENCOUNTER — Ambulatory Visit (INDEPENDENT_AMBULATORY_CARE_PROVIDER_SITE_OTHER): Payer: Medicare Other | Admitting: Family Medicine

## 2020-09-21 ENCOUNTER — Other Ambulatory Visit: Payer: Self-pay

## 2020-09-21 ENCOUNTER — Encounter: Payer: Self-pay | Admitting: Family Medicine

## 2020-09-21 VITALS — BP 116/72 | HR 76 | Temp 98.2°F | Wt 172.5 lb

## 2020-09-21 DIAGNOSIS — M542 Cervicalgia: Secondary | ICD-10-CM

## 2020-09-21 MED ORDER — METHOCARBAMOL 500 MG PO TABS: 500.0000 mg | ORAL_TABLET | Freq: Four times a day (QID) | ORAL | 2 refills | Status: DC | PRN

## 2020-09-21 MED ORDER — DICLOFENAC SODIUM 75 MG PO TBEC: 75.0000 mg | DELAYED_RELEASE_TABLET | Freq: Two times a day (BID) | ORAL | 2 refills | Status: DC

## 2020-09-21 NOTE — Progress Notes (Signed)
   Subjective:    Patient ID: Derek Cox, male    DOB: 19-Sep-1945, 75 y.o.   MRN: [id number]  HPI Here for 2 weeks of constant sharp pain in the left side of the back of the head. It hurts to turn his head side to side. No neurologic deficits. No recent trauma. He is S/P disc surgery in the cervical spine. He has tried Voltaren ointment and some Advil with partial relief.    Review of Systems  Constitutional: Negative.   HENT: Negative.    Eyes: Negative.   Respiratory: Negative.    Cardiovascular: Negative.   Musculoskeletal:  Positive for neck pain.  Neurological: Negative.       Objective:   Physical Exam Constitutional:      General: He is not in acute distress.    Appearance: Normal appearance.  Cardiovascular:     Rate and Rhythm: Normal rate and regular rhythm.     Pulses: Normal pulses.     Heart sounds: Normal heart sounds.  Pulmonary:     Effort: Pulmonary effort is normal.     Breath sounds: Normal breath sounds.  Musculoskeletal:     Comments: He is very tender on the left side of the posterior neck just inferior to the skull. ROM is limited by pain.   Neurological:     General: No focal deficit present.     Mental Status: He is alert. Mental status is at baseline.          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 He will try heat, Diclofenac tablets BID, and Robaxin QID. Recheck if not better in 2 weeks.  Alysia Penna, MD

## 2020-10-01 ENCOUNTER — Other Ambulatory Visit: Payer: Self-pay | Admitting: Family Medicine

## 2020-10-12 ENCOUNTER — Telehealth: Payer: Self-pay | Admitting: Pharmacist

## 2020-10-12 NOTE — Chronic Care Management (AMB) (Signed)
    Chronic Care Management Pharmacy Assistant   Name: Yitzchak Kothari  MRN: [id number] DOB: 20-Jul-1945  Reason for Encounter: Chart Review for CPP visit on 10/15/2020.   Conditions to be addressed/monitored: HTN, HLD, and Seizure and Insomnia  Recent office visits:  09/21/2020- Alysia Penna, MD (PCP)- Patient presented for Posterior Cervical Pain, Diclofenac 75 mg- 1 two times daily and Methocarbamol 500 mg- 1 every 6 hours prn started. Return if not better in 2 weeks. 06/10/2020- Alysia Penna, MD (PCP)- Video Visit- Patient presented for Insomnia, Alprazolam 1 mg- 1 tablet at bedtime PRN started, Temazepam 30 mg discontinued.   Recent consult visits:  None  Hospital visits:  None in previous 6 months  Have you seen any other providers since your last visit? no Any changes in your medications or health? no Any side effects from any medications? no Do you have an symptoms or problems not managed by your medications? no Any concerns about your health right now? Yes- Patient still working through back pains, patient mentioned back surgery 10/2019, was recently seen on 09/21/2020, given Diclofenac and Methocarbamol to help with back pains, patient reports medications are helping a little.  Has your provider asked that you check blood pressure, blood sugar, or follow special diet at home? yes Do you get any type of exercise on a regular basis? yes Can you think of a goal you would like to reach for your health? No Do you have any problems getting your medications? no Is there anything that you would like to discuss during the appointment? Nothing that patient can think of.   Patient aware to bring all medications and supplements along with blood pressure logs to office visit with Jeni Salles, CPP.   Medications: Outpatient Encounter Medications as of 10/12/2020  Medication Sig   ALPRAZolam (XANAX) 1 MG tablet Take 1 tablet (1 mg total) by mouth at bedtime as needed for sleep.    diclofenac (VOLTAREN) 75 MG EC tablet Take 1 tablet (75 mg total) by mouth 2 (two) times daily.   levETIRAcetam (KEPPRA) 500 MG tablet Take 1 tablet (500 mg total) by mouth every 12 (twelve) hours.   lisinopril (ZESTRIL) 20 MG tablet Take 1 tablet (20 mg total) by mouth daily.   methocarbamol (ROBAXIN) 500 MG tablet Take 1 tablet (500 mg total) by mouth every 6 (six) hours as needed for muscle spasms.   rosuvastatin (CRESTOR) 5 MG tablet Take 1 tablet (5 mg total) by mouth daily.   No facility-administered encounter medications on file as of 10/12/2020.    Care Gaps: Hepatitis C Screening Zoster Vaccines- Shingrix (1 of 2) PNA vac Low Risk Adult (2 of 2 - PPSV23) COVID-19 Vaccine (4 - Booster for [employer] series) Annual Wellness Visit- None in the last 6 month, no future scheduled visit.   Star Rating Drugs: Lisinopril 20 mg- Last filled 04/09/2020 for 90 day supply ( [facility] Last fill 09/03/2020 for a 90 day supply.) Rosuvastatin 5 mg- Last filled 04/09/2020 for 90 day supply ([facility]now [facility] Last fill 09/03/2020 for a 90 day supply.)  SIG: Pattricia Boss, [place] Pharmacist Assistant [phone]

## 2020-10-13 DIAGNOSIS — L570 Actinic keratosis: Secondary | ICD-10-CM | POA: Diagnosis not present

## 2020-10-13 DIAGNOSIS — L820 Inflamed seborrheic keratosis: Secondary | ICD-10-CM | POA: Diagnosis not present

## 2020-10-13 DIAGNOSIS — D485 Neoplasm of uncertain behavior of skin: Secondary | ICD-10-CM | POA: Diagnosis not present

## 2020-10-15 ENCOUNTER — Ambulatory Visit (INDEPENDENT_AMBULATORY_CARE_PROVIDER_SITE_OTHER): Payer: Medicare Other | Admitting: Pharmacist

## 2020-10-15 ENCOUNTER — Other Ambulatory Visit: Payer: Self-pay

## 2020-10-15 DIAGNOSIS — E782 Mixed hyperlipidemia: Secondary | ICD-10-CM

## 2020-10-15 DIAGNOSIS — I10 Essential (primary) hypertension: Secondary | ICD-10-CM

## 2020-10-15 DIAGNOSIS — N401 Enlarged prostate with lower urinary tract symptoms: Secondary | ICD-10-CM

## 2020-10-15 NOTE — Progress Notes (Signed)
Chronic Care Management Pharmacy Note  10/15/20 Name:  Vanden Fawaz MRN:  [id number] DOB:  [birth year]  Summary: BP is at goal < 130/80 LDL is at goal < 100  Recommendations/Changes made from today's visit: -Recommended avoidance of NSAIDs and use of Tylenol for pain -Recommended weekly monitoring of BP at home  Plan: BP assessment in 2-3 months   Subjective: Derek Cox is an 75 y.o. year old male who is a primary patient of [name], MD.  The CCM team was consulted for assistance with disease management and care coordination needs.    Engaged with patient face to face for initial visit in response to provider referral for pharmacy case management and/or care coordination services.   Consent to Services:  The patient was given the following information about Chronic Care Management services today, agreed to services, and gave verbal consent: 1. CCM service includes personalized support from designated clinical staff supervised by the primary care provider, including individualized plan of care and coordination with other care providers 2. 24/7 contact phone numbers for assistance for urgent and routine care needs. 3. Service will only be billed when office clinical staff spend 20 minutes or more in a month to coordinate care. 4. Only one practitioner may furnish and bill the service in a calendar month. 5.The patient may stop CCM services at any time (effective at the end of the month) by phone call to the office staff. 6. The patient will be responsible for cost sharing (co-pay) of up to 20% of the service fee (after annual deductible is met). Patient agreed to services and consent obtained.  Patient Care Team: [name], MD as PCP - General (Family Medicine) Viona Gilmore, [facility] as Pharmacist (Pharmacist)  Recent office visits: 09/21/2020 Alysia Penna, MD (PCP)- Patient presented for posterior cervical pain. Prescribed diclofenac 75 mg two times daily and  methocarbamol 500 mg every 6 hours prn. Return if not better in 2 weeks.  06/10/2020 Alysia Penna, MD (PCP)- Video Visit- Patient presented for insomnia. Prescribed alprazolam 1 mg tablet at bedtime PRN started and discontinued temazepam 30 mg.  Recent consult visits: None  Hospital visits: None in previous 6 months   Objective:  Lab Results  Component Value Date   CREATININE 1.10 04/09/2020   BUN 15 04/09/2020   GFR 65.36 04/09/2020   GFRNONAA 54 (L) 11/07/2019   GFRAA >60 11/07/2019   NA 140 04/09/2020   K 3.9 04/09/2020   CALCIUM 9.3 04/09/2020   CO2 30 04/09/2020   GLUCOSE 89 04/09/2020    Lab Results  Component Value Date/Time   GFR 65.36 04/09/2020 09:59 AM   GFR 71.97 04/23/2019 09:45 AM    Last diabetic Eye exam: No results found for: HMDIABEYEEXA  Last diabetic Foot exam: No results found for: HMDIABFOOTEX   Lab Results  Component Value Date   CHOL 186 04/09/2020   HDL 68.40 04/09/2020   LDLCALC 97 04/09/2020   TRIG 104.0 04/09/2020   CHOLHDL 3 04/09/2020    Hepatic Function Latest Ref Rng & Units 04/09/2020 04/23/2019 04/16/2018  Total Protein 6.0 - 8.3 g/dL 6.8 6.6 7.0  Albumin 3.5 - 5.2 g/dL 4.6 4.5 4.7  AST 0 - 37 U/L $Remo'20 19 20  'jkqOD$ ALT 0 - 53 U/L $Remo'14 17 14  'tgZTD$ Alk Phosphatase 39 - 117 U/L 60 58 63  Total Bilirubin 0.2 - 1.2 mg/dL 0.9 0.8 0.7  Bilirubin, Direct 0.0 - 0.3 mg/dL 0.2 0.2 0.1  Lab Results  Component Value Date/Time   TSH 1.81 04/09/2020 09:59 AM   TSH 2.31 04/23/2019 09:45 AM    CBC Latest Ref Rng & Units 04/09/2020 11/07/2019 11/04/2019  WBC 4.0 - 10.5 K/uL 5.3 10.5 5.7  Hemoglobin 13.0 - 17.0 g/dL 15.2 11.9(L) 15.7  Hematocrit 39.0 - 52.0 % 44.8 36.0(L) 47.5  Platelets 150.0 - 400.0 K/uL 213.0 158 209    No results found for: VD25OH  Clinical ASCVD: No  The 10-year ASCVD risk score (Arnett DK, et al., 2019) is: 23.2%   Values used to calculate the score:     Age: 61 years     Sex: Male     Is Non-Hispanic African American: No      Diabetic: No     Tobacco smoker: No     Systolic Blood Pressure: 644 mmHg     Is BP treated: Yes     HDL Cholesterol: 68.4 mg/dL     Total Cholesterol: 186 mg/dL    Depression screen [facility] 2/9 06/17/2019 04/16/2018 12/02/2014  Decreased Interest 0 0 0  Down, Depressed, Hopeless 0 0 0  PHQ - 2 Score 0 0 0      Social History   Tobacco Use  Smoking Status Former   Packs/day: 0.50   Years: 20.00   Pack years: 10.00   Types: Cigarettes   Quit date: 08/08/1996   Years since quitting: 24.4  Smokeless Tobacco Never   BP Readings from Last 3 Encounters:  09/21/20 116/72  04/09/20 140/80  11/07/19 90/60   Pulse Readings from Last 3 Encounters:  09/21/20 76  04/09/20 78  11/07/19 87   Wt Readings from Last 3 Encounters:  09/21/20 172 lb 8 oz (78.2 kg)  04/09/20 168 lb (76.2 kg)  11/06/19 161 lb 9.6 oz (73.3 kg)   BMI Readings from Last 3 Encounters:  09/21/20 25.47 kg/m  04/09/20 24.81 kg/m  11/06/19 23.86 kg/m    Assessment/Interventions: Review of patient past medical history, allergies, medications, health status, including review of consultants reports, laboratory and other test data, was performed as part of comprehensive evaluation and provision of chronic care management services.   SDOH:  (Social Determinants of Health) assessments and interventions performed: Yes SDOH Interventions    Flowsheet Row Most Recent Value  SDOH Interventions   Financial Strain Interventions Intervention Not Indicated  Transportation Interventions Intervention Not Indicated      SDOH Screenings   Alcohol Screen: Not on file  Depression (PHQ2-9): Not on file  Financial Resource Strain: Low Risk    Difficulty of Paying Living Expenses: Not hard at all  Food Insecurity: Not on file  Housing: Not on file  Physical Activity: Not on file  Social Connections: Not on file  Stress: Not on file  Tobacco Use: Medium Risk   Smoking Tobacco Use: Former   Smokeless Tobacco Use: Never    Passive Exposure: Not on file  Transportation Needs: No Transportation Needs   Lack of Transportation (Medical): No   Lack of Transportation (Non-Medical): No   Yes- Patient still working through back pains, patient mentioned back surgery 10/2019, was recently seen on 09/21/2020, given Diclofenac and Methocarbamol to help with back pains, patient reports medications are helping a little.   Patient is an early riser and is usually up before the sun. He is retired from [employer] He spends a lot of time doing yard work  and is no longer able to play golf with back pain, which has been ongoing  for the past 3 years.  He had a back surgery back in August with Dr. Arnoldo Morale, has had a neck surgery and several other back surgeries. He used to be able to travel but cannot because of the pain. His biggest health concern right now is with pain.  Patient is not having pain right now but some pain when getting up. He recently joined the [employer] with his wife but hasn't started using it and is on the fence about going. He does do some walking some almost every day but his wife goes sometimes which holds him back.  Patient is sleeping better and was having trouble staying asleep. He does have to get up to pee several times a night.  Patient has had any falls and reports he is in "amazingly good health". He reports Xanax is the only thing that works for his sleep and tried to go a night without and he couldn't go to sleep. He does have a routine going to bed at the same time every night around 11-11:30.   CCM Care Plan  Allergies  Allergen Reactions   Dilantin [Phenytoin] Other (See Comments)    "felt weird"    Medications Reviewed Today     Reviewed by Viona Gilmore, [facility] (Pharmacist) on 10/15/20 at 1150  Med List Status: <None>   Medication Order Taking? Sig Documenting Provider Last Dose Status Informant  ALPRAZolam (XANAX) 1 MG tablet [id number] Yes Take 1 tablet (1 mg total) by mouth at bedtime as needed  for sleep. [name], MD Taking Active   diclofenac (VOLTAREN) 75 MG EC tablet [id number] No Take 1 tablet (75 mg total) by mouth 2 (two) times daily.  Patient not taking: Reported on 10/15/2020   [name], MD Not Taking Active   levETIRAcetam (KEPPRA) 500 MG tablet [id number] Yes Take 1 tablet (500 mg total) by mouth every 12 (twelve) hours. [name], MD Taking Active   lisinopril (ZESTRIL) 20 MG tablet [id number] Yes Take 1 tablet (20 mg total) by mouth daily. [name], MD Taking Active   methocarbamol (ROBAXIN) 500 MG tablet [id number] Yes Take 1 tablet (500 mg total) by mouth every 6 (six) hours as needed for muscle spasms. [name], MD Taking Active   rosuvastatin (CRESTOR) 5 MG tablet [id number] Yes Take 1 tablet (5 mg total) by mouth daily. [name], MD Taking Active   tamsulosin [facility]) 0.4 MG CAPS capsule [id number] Yes Take 0.4 mg by mouth. 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   HTN (hypertension) 12/02/2014   Hyperlipidemia 12/02/2014   Hx of completed stroke 12/02/2014   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/2014   Insomnia 12/02/2014   Hx of seizure disorder 12/02/2014    Immunization History  Administered Date(s) Administered   PFIZER(Purple Top)SARS-COV-2 Vaccination 04/14/2019, 05/02/2019, 01/29/2020   Pneumococcal Conjugate-13 04/16/2018   Tdap 05/29/2015    Conditions to be addressed/monitored:  Hypertension, Hyperlipidemia, BPH, and Insomnia, Pain, and Seizures  Care Plan : [facility]  Updates made by Viona Gilmore, [place] since 01/23/2021 12:00 AM     Problem: Problem: Hypertension, Hyperlipidemia, BPH, and  Insomnia, Pain, and Seizures      Long-Range Goal: Patient-Specific Goal   Start Date: 10/15/2020  Expected End Date: 10/15/2021  This Visit's Progress: On track  Priority: High  Note:   Current Barriers:  Unable to independently monitor therapeutic efficacy  Pharmacist Clinical Goal(s):  Patient will achieve adherence to monitoring guidelines and medication adherence to achieve therapeutic efficacy through collaboration with PharmD and provider.   Interventions: 1:1 collaboration with Nelwyn Salisbury, MD regarding development and update of comprehensive plan of care as evidenced by provider attestation and co-signature Inter-disciplinary care team collaboration (see longitudinal plan of care) Comprehensive medication review performed; medication list updated in electronic medical record  Hypertension (BP goal <130/80) -Controlled -Current treatment: Lisinopril 20 mg 1 tablet daily - PM -Medications previously tried: amlodipine  -Current home readings: 80-90/70; high as 130/90 (several times a month- wrist cuff) -Current dietary habits: healthy diet - lots of greens and vegetables; no beef since it got expensive - lots of hamburger, chicken, fish; rarely picks up salt shaker - some chips  -Current exercise habits: walking almost every day -Denies hypotensive/hypertensive symptoms -Educated on Importance of home blood pressure monitoring; Proper BP monitoring technique; Symptoms of hypotension and importance of maintaining adequate hydration; -Counseled to monitor BP at home weekly, document, and provide log at future appointments -Counseled on diet and exercise extensively Recommended to continue current medication  Hyperlipidemia: (LDL goal < 100) -Controlled -Current treatment: Rosuvastatin 5 mg 1 tablet daily -Medications previously tried: none  -Current dietary patterns: rarely eats fried foods -Current exercise habits: walking almost every day -Educated on Cholesterol  goals;  Benefits of statin for ASCVD risk reduction; Importance of limiting foods high in cholesterol; Exercise goal of 150 minutes per week; -Counseled on diet and exercise extensively Recommended to continue current medication  Pain/muscle spasms  (Goal: minimize pain) -Controlled -Current treatment  Diclofenac 75 mg 1 tablet twice daily Methocarbamol 500 mg 1 tablet every 6 hours as needed -Medications previously tried: n/a  -Recommended use of Tylenol.  Insomnia (Goal: improve quality of sleep) -Controlled -Current treatment  Alprazolam 1 mg 1 tablet as needed -Medications previously tried: temazepam, melatonin  -Counseled on practicing good sleep hygiene by setting a sleep schedule and maintaining it, avoid excessive napping, following a nightly routine, avoiding screen time for 30-60 minutes before going to bed, and making the bedroom a cool, quiet and dark space Recommended limiting use of benzodiazepines.  Seizures (Goal: prevent seizures) -Controlled -Current treatment  Levetiracetam 500 mg 1 tablet every 12 hours -Medications previously tried: none  -Recommended to continue current medication   Health Maintenance -Vaccine gaps: shingles, Pneumovax, COVID booster -Current therapy:  No medications -Educated on Cost vs benefit of each product must be carefully weighed by individual consumer -Patient is satisfied with current therapy and denies issues -Recommended to continue as is  Patient Goals/Self-Care Activities Patient will:  - take medications as prescribed check blood pressure weekly, document, and provide at future appointments target a minimum of 150 minutes of moderate intensity exercise weekly  Follow Up Plan: The care management team will reach out to the patient again over the next 30 days.       Medication Assistance: None required.  Patient affirms current coverage meets needs.  Compliance/Adherence/Medication fill history: Care Gaps: Hep C  screening, shingrix, Pneumovax, COVID booster  Star-Rating Drugs: Lisinopril 20 mg - last filled 09/03/20 for 90 ds at [facility] Rosuvastatin 5 mg - last filled 09/03/20 for 90 ds at [facility]  Patient's preferred pharmacy is:  [id number] - [place], [place] - [address] [place] [address] Phone: [phone] Fax: [phone]  [facility] - [place], [place] [address] Phone: [phone] Fax: [phone]  Uses pill box? Yes Pt endorses 100% compliance - rarely   We discussed: Current pharmacy is preferred with insurance plan and patient is satisfied with pharmacy services Patient decided to: Continue current medication management strategy  Care Plan and Follow Up Patient Decision:  Patient agrees to Care Plan and Follow-up.  Plan: The care management team will reach out to the patient again over the next 30 days.  Jeni Salles, PharmD, [place] Pharmacist [place] [facility] [place]

## 2020-10-25 DIAGNOSIS — N138 Other obstructive and reflux uropathy: Secondary | ICD-10-CM

## 2020-10-25 DIAGNOSIS — E782 Mixed hyperlipidemia: Secondary | ICD-10-CM | POA: Diagnosis not present

## 2020-10-25 DIAGNOSIS — I10 Essential (primary) hypertension: Secondary | ICD-10-CM

## 2020-10-25 DIAGNOSIS — N401 Enlarged prostate with lower urinary tract symptoms: Secondary | ICD-10-CM

## 2020-11-13 ENCOUNTER — Other Ambulatory Visit: Payer: Self-pay | Admitting: Family Medicine

## 2020-11-20 ENCOUNTER — Other Ambulatory Visit: Payer: Self-pay | Admitting: Family Medicine

## 2020-12-01 ENCOUNTER — Other Ambulatory Visit: Payer: Self-pay | Admitting: Family Medicine

## 2020-12-01 DIAGNOSIS — I10 Essential (primary) hypertension: Secondary | ICD-10-CM | POA: Diagnosis not present

## 2020-12-01 DIAGNOSIS — H9202 Otalgia, left ear: Secondary | ICD-10-CM | POA: Diagnosis not present

## 2020-12-25 ENCOUNTER — Other Ambulatory Visit: Payer: Self-pay | Admitting: Family Medicine

## 2020-12-29 ENCOUNTER — Telehealth: Payer: Self-pay | Admitting: Family Medicine

## 2020-12-29 DIAGNOSIS — R519 Headache, unspecified: Secondary | ICD-10-CM | POA: Diagnosis not present

## 2020-12-29 DIAGNOSIS — G319 Degenerative disease of nervous system, unspecified: Secondary | ICD-10-CM | POA: Diagnosis not present

## 2020-12-29 DIAGNOSIS — J3489 Other specified disorders of nose and nasal sinuses: Secondary | ICD-10-CM | POA: Diagnosis not present

## 2020-12-29 DIAGNOSIS — G9389 Other specified disorders of brain: Secondary | ICD-10-CM | POA: Diagnosis not present

## 2020-12-29 MED ORDER — ALPRAZOLAM 1 MG PO TABS: 1.0000 mg | ORAL_TABLET | Freq: Every evening | ORAL | 5 refills | Status: DC | PRN

## 2020-12-29 NOTE — Telephone Encounter (Signed)
I refilled the Xanax. We had stopped the Temazepam some months ago and are using Xanax instead

## 2020-12-29 NOTE — Telephone Encounter (Signed)
Last office visit- 09/21/20 Last refill- 06/10/20--30 tabs, 5 refills  No future office visit scheduled.   Can this patient receive a refill?

## 2020-12-29 NOTE — Telephone Encounter (Signed)
Patient called stating that the pharmacy will not refill his prescription for ALPRAZolam Derek Cox) 1 MG tablet or temazepam. [facility] uses [id number] - [place], [place] - [address]  Patient's call back number is [phone].  Please advise.

## 2020-12-31 ENCOUNTER — Telehealth: Payer: Self-pay

## 2020-12-31 NOTE — Telephone Encounter (Signed)
Left a detailed message for pt on his mobile phone

## 2020-12-31 NOTE — Telephone Encounter (Signed)
Spoke with pt state that he is still taking Temazepam, advised pt to stop taking Rx since it was changed to Alprazolam by Dr Sarajane Jews. Pt verbalized understanding

## 2021-01-06 ENCOUNTER — Telehealth: Payer: Self-pay | Admitting: Pharmacist

## 2021-01-06 NOTE — Chronic Care Management (AMB) (Addendum)
Chronic Care Management Pharmacy Assistant   Name: Derek Cox  MRN: [id number] DOB: Feb 11, 1946  Reason for Encounter: General Assessment Call    Conditions to be addressed/monitored: HTN and HLD  Recent office visits:  None  Recent consult visits:  None  Hospital visits:  None in previous 6 months  Medications: Outpatient Encounter Medications as of 01/06/2021  Medication Sig   ALPRAZolam (XANAX) 1 MG tablet Take 1 tablet (1 mg total) by mouth at bedtime as needed for sleep.   diclofenac (VOLTAREN) 75 MG EC tablet Take 1 tablet (75 mg total) by mouth 2 (two) times daily. (Patient not taking: Reported on 10/15/2020)   levETIRAcetam (KEPPRA) 500 MG tablet Take 1 tablet (500 mg total) by mouth every 12 (twelve) hours.   lisinopril (ZESTRIL) 20 MG tablet Take 1 tablet (20 mg total) by mouth daily.   methocarbamol (ROBAXIN) 500 MG tablet Take 1 tablet (500 mg total) by mouth every 6 (six) hours as needed for muscle spasms.   rosuvastatin (CRESTOR) 5 MG tablet Take 1 tablet (5 mg total) by mouth daily.   tamsulosin (FLOMAX) 0.4 MG CAPS capsule Take 1 capsule by mouth daily.   No facility-administered encounter medications on file as of 01/06/2021.  Reviewed chart prior to disease state call. Spoke with patient regarding BP  Recent Office Vitals: BP Readings from Last 3 Encounters:  09/21/20 116/72  04/09/20 140/80  11/07/19 90/60   Pulse Readings from Last 3 Encounters:  09/21/20 76  04/09/20 78  11/07/19 87    Wt Readings from Last 3 Encounters:  09/21/20 172 lb 8 oz (78.2 kg)  04/09/20 168 lb (76.2 kg)  11/06/19 161 lb 9.6 oz (73.3 kg)     Kidney Function Lab Results  Component Value Date/Time   CREATININE 1.10 04/09/2020 09:59 AM   CREATININE 1.29 (H) 11/07/2019 05:07 AM   GFR 65.36 04/09/2020 09:59 AM   GFRNONAA 54 (L) 11/07/2019 05:07 AM   GFRAA >60 11/07/2019 05:07 AM    BMP Latest Ref Rng & Units 04/09/2020 11/07/2019 11/04/2019  Glucose 70 - 99  mg/dL 89 141(H) 78  BUN 6 - 23 mg/dL 15 15 16   Creatinine 0.40 - 1.50 mg/dL 1.10 1.29(H) 1.22  Sodium 135 - 145 mEq/L 140 136 141  Potassium 3.5 - 5.1 mEq/L 3.9 3.7 4.1  Chloride 96 - 112 mEq/L 104 101 103  CO2 19 - 32 mEq/L 30 26 28   Calcium 8.4 - 10.5 mg/dL 9.3 8.2(L) 9.3   Spoke to patients wife for call   Current antihypertensive regimen:  Lisinopril 20 mg 1 tablet daily - AM How often are you checking your Blood Pressure? Wife reports they have a cuff but he does not check his pressures, advised that he should aim for once a week and write them down she advised she will encourage him to do so  Patient returned call stated his reading of 190/60 advised him of the above as well as to have it checked somewhere else as well to ensure its accuracy. He reports no headaches, diziness or lightheadedness. Current home BP readings: Patient is not checking, wife reports he is not complaining of any headaches, dizziness or lightheadedness.  What recent interventions/DTPs have been made by any provider to improve Blood Pressure control since last CPP Visit: Wife reports no changes. Any recent hospitalizations or ED visits since last visit with CPP? None What diet changes have been made to improve Blood Pressure Control?  Wife reports that he  is eating meals at home does not intake much sodium in meals greens, veggies, chicken and fish What exercise is being done to improve your Blood Pressure Control?  Wife reports he is very active outside in the yard and she thinks too much, and it causes his back to hurt.  Adherence Review: Is the patient currently on ACE/ARB medication? Yes Does the patient have >5 day gap between last estimated fill dates? No  01/06/2021 Name: Derek Cox MRN: [id number] DOB: May 01, 1945 Chevez Payam Cox is a 75 y.o. year old male who is a primary care patient of [name], MD.  Comprehensive medication review performed; Spoke to patient regarding  cholesterol  Lipid Panel    Component Value Date/Time   CHOL 186 04/09/2020 0959   TRIG 104.0 04/09/2020 0959   HDL 68.40 04/09/2020 0959   LDLCALC 97 04/09/2020 0959    10-year ASCVD risk score: The 10-year ASCVD risk score (Arnett DK, et al., 2019) is: 23.2%   Values used to calculate the score:     Age: 60 years     Sex: Male     Is Non-Hispanic African American: No     Diabetic: No     Tobacco smoker: No     Systolic Blood Pressure: 160 mmHg     Is BP treated: Yes     HDL Cholesterol: 68.4 mg/dL     Total Cholesterol: 186 mg/dL  Current antihyperlipidemic regimen:  Rosuvastatin 5 mg 1 tablet daily ASCVD risk enhancing conditions: age >50 and HTN What recent interventions/DTPs have been made by any provider to improve Cholesterol control since last CPP Visit: None   Adherence Review: Does the patient have >5 day gap between last estimated fill dates? No  Care Gaps: Hepatitis C Screening - Overdue COVID Booster #4 Therapist, music) - Overdue Flu Vaccine - Overdue AWV-MSG sent to [place] to schedule. CCM - 11/22  Star Rating Drugs: Rosuvastatin (Crestor) 5 mg - Last filled 11/13/20 90 DS at [place] Lisinopril (Zestril) 20 mg  - Last filled 11/13/20 90 DS at [facility] Call to [facility] verified the above fill dates spoke to [place] Pharmacist Assistant [phone]

## 2021-01-15 ENCOUNTER — Ambulatory Visit: Payer: Medicare Other

## 2021-01-25 ENCOUNTER — Other Ambulatory Visit: Payer: Self-pay | Admitting: Family Medicine

## 2021-01-27 ENCOUNTER — Other Ambulatory Visit: Payer: Self-pay | Admitting: Family Medicine

## 2021-01-29 ENCOUNTER — Other Ambulatory Visit: Payer: Self-pay | Admitting: Family Medicine

## 2021-02-01 ENCOUNTER — Telehealth: Payer: Self-pay

## 2021-02-01 NOTE — Telephone Encounter (Signed)
This message is for Dr Sarajane Jews: Your office and requested my blood pressure readings:    151/90, 143/91,143/89, 127/64, 144/89, 127/80   I will bring my cuff to my next visit   Regards   Derek Cox    Pt last annual exam 04/09/20. Pt called to schedule f/u appt for HTN.Marland Kitchen

## 2021-02-01 NOTE — Telephone Encounter (Signed)
Noted  

## 2021-02-02 DIAGNOSIS — M48062 Spinal stenosis, lumbar region with neurogenic claudication: Secondary | ICD-10-CM | POA: Diagnosis not present

## 2021-02-02 DIAGNOSIS — Z981 Arthrodesis status: Secondary | ICD-10-CM | POA: Diagnosis not present

## 2021-02-02 DIAGNOSIS — H9202 Otalgia, left ear: Secondary | ICD-10-CM | POA: Diagnosis not present

## 2021-02-02 DIAGNOSIS — I10 Essential (primary) hypertension: Secondary | ICD-10-CM | POA: Diagnosis not present

## 2021-02-03 NOTE — Telephone Encounter (Signed)
LVM for pt to return call to schedule appt with PCP for HTN.

## 2021-02-08 DIAGNOSIS — Z23 Encounter for immunization: Secondary | ICD-10-CM | POA: Diagnosis not present

## 2021-02-08 DIAGNOSIS — L820 Inflamed seborrheic keratosis: Secondary | ICD-10-CM | POA: Diagnosis not present

## 2021-02-09 ENCOUNTER — Ambulatory Visit (INDEPENDENT_AMBULATORY_CARE_PROVIDER_SITE_OTHER): Payer: Medicare Other | Admitting: Family Medicine

## 2021-02-09 ENCOUNTER — Encounter: Payer: Self-pay | Admitting: Family Medicine

## 2021-02-09 VITALS — BP 140/90 | HR 74 | Temp 98.0°F | Wt 169.0 lb

## 2021-02-09 DIAGNOSIS — G8929 Other chronic pain: Secondary | ICD-10-CM | POA: Diagnosis not present

## 2021-02-09 DIAGNOSIS — M545 Low back pain, unspecified: Secondary | ICD-10-CM

## 2021-02-09 DIAGNOSIS — G500 Trigeminal neuralgia: Secondary | ICD-10-CM | POA: Diagnosis not present

## 2021-02-09 MED ORDER — GABAPENTIN 100 MG PO CAPS: 100.0000 mg | ORAL_CAPSULE | Freq: Three times a day (TID) | ORAL | 3 refills | Status: DC

## 2021-02-09 NOTE — Progress Notes (Signed)
   Subjective:    Patient ID: Derek Cox, male    DOB: 1945-12-27, 75 y.o.   MRN: [id number]  HPI Here for several issues. First about 3 weeks ago he began to have intermittent sharp "electric" pains in the left temple area. These last about 10 seconds at a time. No vision changes, no neurologic deficits. Also he continues to struggle with low back pain. He had several epidural steroid injections some years ago per Dr. Suella Broad, but these do not help. He is interested in trying an ablation procedure.    Review of Systems  Constitutional: Negative.   Eyes: Negative.   Respiratory: Negative.    Cardiovascular: Negative.   Musculoskeletal:  Positive for back pain.  Neurological:  Positive for headaches.      Objective:   Physical Exam Constitutional:      Appearance: Normal appearance.  HENT:     Head: Normocephalic and atraumatic.  Eyes:     Extraocular Movements: Extraocular movements intact.     Pupils: Pupils are equal, round, and reactive to light.     Comments: No photophobia   Cardiovascular:     Rate and Rhythm: Normal rate and regular rhythm.     Pulses: Normal pulses.     Heart sounds: Normal heart sounds.  Pulmonary:     Effort: Pulmonary effort is normal.     Breath sounds: Normal breath sounds.  Neurological:     General: No focal deficit present.     Mental Status: He is alert and oriented to person, place, and time.     Cranial Nerves: No cranial nerve deficit.     Motor: No weakness.     Coordination: Coordination normal.     Gait: Gait normal.          Assessment & Plan:  He has trigeminal neuralgia and we will treat this with Gabapentin 100 mg TID. For the low back pain, per his request we will refer him to Dr. Sarina Ill. Alysia Penna, MD

## 2021-02-11 ENCOUNTER — Telehealth: Payer: Self-pay | Admitting: Pharmacist

## 2021-02-11 NOTE — Chronic Care Management (AMB) (Signed)
    Chronic Care Management Pharmacy Assistant   Name: Derek Cox  MRN: [id number] DOB: February 24, 1946  02/11/21 Attempt was made to confirm appointment with Jeni Salles on 02/12/21 patient reported he was  not interested in keeping the appointment as he has just followed up with his PCP on the 15th. He declined pushing out the appointment until after the new year, reports he wishes to follow up with his PCP only. Attempted to speak with him concerning home pressures he declined as well stated his readings were fine in office when he followed up with his provider. Appointment cancelled. Will attempt Assessment at later date.   Medications: Outpatient Encounter Medications as of 02/11/2021  Medication Sig   ALPRAZolam (XANAX) 1 MG tablet Take 1 tablet (1 mg total) by mouth at bedtime as needed for sleep.   gabapentin (NEURONTIN) 100 MG capsule Take 1 capsule (100 mg total) by mouth 3 (three) times daily.   levETIRAcetam (KEPPRA) 500 MG tablet Take 1 tablet (500 mg total) by mouth every 12 (twelve) hours.   lisinopril (ZESTRIL) 20 MG tablet TAKE 1 TABLET EVERY DAY   methocarbamol (ROBAXIN) 500 MG tablet Take 1 tablet (500 mg total) by mouth every 6 (six) hours as needed for muscle spasms.   rosuvastatin (CRESTOR) 5 MG tablet TAKE 1 TABLET EVERY DAY   tamsulosin (FLOMAX) 0.4 MG CAPS capsule Take 1 capsule by mouth daily.   No facility-administered encounter medications on file as of 02/11/2021.    Care Gaps: Hepatitis C Screening - Overdue COVID Booster #4 Therapist, music) - Overdue Flu Vaccine - Overdue AWV-MSG sent to Ramond Craver CMA to schedule.(01/10/21) CCM - 11/22 Cancelled  Star Rating Drugs: Rosuvastatin (Crestor) 5 mg - Last filled 11/13/20 90 DS at [place] Lisinopril (Zestril) 20 mg  - Last filled 11/13/20 90 DS at [facility] Call to [facility] verified the above fill dates spoke to [place] Pharmacist Assistant [phone]

## 2021-02-12 ENCOUNTER — Ambulatory Visit: Payer: Medicare Other

## 2021-02-24 ENCOUNTER — Telehealth: Payer: Self-pay

## 2021-02-24 NOTE — Telephone Encounter (Signed)
[facility] Phone: [phone]   Please clarify with referring office that they intended to send the referral to [place], because they are requesting Dr. Jaynee Eagles who is at [facility].  If they are requesting [facility], we can schedule for evaluation of trigeminal neuralgia.

## 2021-02-25 NOTE — Telephone Encounter (Signed)
See message that has been placed in 02/09/21 referral.

## 2021-02-26 NOTE — Telephone Encounter (Signed)
He specifically asked to see Dr. Jaynee Eagles

## 2021-03-01 ENCOUNTER — Other Ambulatory Visit: Payer: Self-pay | Admitting: Family Medicine

## 2021-03-01 NOTE — Telephone Encounter (Signed)
Please change the reason for the referral from back pain to left sided trigeminal neuralgia

## 2021-03-01 NOTE — Telephone Encounter (Signed)
Patient's wife called because they were denied an appointment with [facility] because the reason listed on referral is back pain. Wife states that patient does have back pain but that is not the reason they need to be seen by Neurology. Patient and wife are concerned about the electrical shock feeling patient has on his side multiple times a day. Referral will still go to Dr.Ahern.    Please advise

## 2021-03-01 NOTE — Telephone Encounter (Signed)
Please advise 

## 2021-03-03 ENCOUNTER — Telehealth: Payer: Self-pay

## 2021-03-03 DIAGNOSIS — G500 Trigeminal neuralgia: Secondary | ICD-10-CM

## 2021-03-03 NOTE — Telephone Encounter (Signed)
Referral placed per Dr Sarajane Jews

## 2021-03-03 NOTE — Telephone Encounter (Signed)
Spoke with office referral coordinator advised to place a new referral, referral placed and pt was  notified

## 2021-03-08 ENCOUNTER — Telehealth: Payer: Self-pay | Admitting: Family Medicine

## 2021-03-08 NOTE — Telephone Encounter (Signed)
Taylor from [place] called to follow up on fax that was sent for ALPRAZolam Duanne Moron) 1 MG tablet on 12/05. I let her know that it had probably been taken to the back for Dr.Fry since the folder was cleared, but she was welcome to refax it. She stated she would and confirmed fax number. Just FYI

## 2021-03-09 NOTE — Telephone Encounter (Signed)
FYI  refaxing medication request form for Alprazolam.

## 2021-03-12 ENCOUNTER — Other Ambulatory Visit: Payer: Self-pay | Admitting: *Deleted

## 2021-03-12 NOTE — Telephone Encounter (Signed)
Patient requests a refill of  Alprazolam  [place]

## 2021-03-15 MED ORDER — ALPRAZOLAM 1 MG PO TABS: 1.0000 mg | ORAL_TABLET | Freq: Every evening | ORAL | 1 refills | Status: DC | PRN

## 2021-03-21 ENCOUNTER — Other Ambulatory Visit: Payer: Self-pay | Admitting: Family Medicine

## 2021-03-24 ENCOUNTER — Other Ambulatory Visit: Payer: Self-pay | Admitting: Family Medicine

## 2021-03-24 ENCOUNTER — Telehealth: Payer: Self-pay | Admitting: Family Medicine

## 2021-03-24 NOTE — Telephone Encounter (Signed)
Disregard pt found his medication

## 2021-03-24 NOTE — Telephone Encounter (Signed)
Patient called to get refills for ALPRAZolam Duanne Moron) 1 MG tablet  Prescription will need to be sent as early refill  Please send to [id number] - [place], [place] Phone:  [phone]  Fax:  [phone]         Please advise

## 2021-03-24 NOTE — Telephone Encounter (Signed)
Noted  

## 2021-04-05 ENCOUNTER — Telehealth: Payer: Self-pay | Admitting: Family Medicine

## 2021-04-05 NOTE — Telephone Encounter (Signed)
Left message for patient to call back and schedule Medicare Annual Wellness Visit (AWV) either virtually or in office. Left  my Herbie Drape number [phone]    AWV-S per [place] 11/27/14  ; please schedule at anytime with [facility] Nurse Health Advisor 1 or 2   This should be a 45 minute visit.

## 2021-04-12 ENCOUNTER — Ambulatory Visit (INDEPENDENT_AMBULATORY_CARE_PROVIDER_SITE_OTHER): Payer: Medicare Other

## 2021-04-12 VITALS — Ht 70.0 in | Wt 170.0 lb

## 2021-04-12 DIAGNOSIS — Z0000 Encounter for general adult medical examination without abnormal findings: Secondary | ICD-10-CM

## 2021-04-12 NOTE — Progress Notes (Signed)
I connected with Derek Cox today by telephone and verified that I am speaking with the correct person using two identifiers. Location patient: home Location provider: work Persons participating in the virtual visit: Albertus Celvin, [place] LPN.   I discussed the limitations, risks, security and privacy concerns of performing an evaluation and management service by telephone and the availability of in person appointments. I also discussed with the patient that there may be a patient responsible charge related to this service. The patient expressed understanding and verbally consented to this telephonic visit.    Interactive audio and video telecommunications were attempted between this provider and patient, however failed, due to patient having technical difficulties OR patient did not have access to video capability.  We continued and completed visit with audio only.     Vital signs may be patient reported or missing.  Subjective:   Derek Cox is a 76 y.o. male who presents for Medicare Annual/Subsequent preventive examination.  Review of Systems     Cardiac Risk Factors include: advanced age (>56men, >23 women);dyslipidemia;hypertension;male gender     Objective:    Today's Vitals   04/12/21 1257  Weight: 170 lb (77.1 kg)  Height: 5\' 10"  (1.778 m)   Body mass index is 24.39 kg/m.  Advanced Directives 04/12/2021 11/06/2019 11/04/2019 07/15/2016 11/08/2012 08/09/2011  Does Patient Have a Medical Advance Directive? Yes - Yes Yes Patient has advance directive, copy not in chart Patient has advance directive, copy not in chart  Type of Advance Directive [place];Living will - [place];Living will [place];Living will [place];Living will Living will  Does patient want to make changes to medical advance directive? - - No - Patient declined - - -  Copy of [place] in Chart? No -  copy requested No - copy requested No - copy requested Yes - -  Would patient like information on creating a medical advance directive? - - No - Patient declined - - -  Pre-existing out of facility DNR order (yellow form or pink MOST form) - - - - No -    Current Medications (verified) Outpatient Encounter Medications as of 04/12/2021  Medication Sig   ALPRAZolam (XANAX) 1 MG tablet Take 1 tablet (1 mg total) by mouth at bedtime as needed for sleep.   gabapentin (NEURONTIN) 100 MG capsule Take 1 capsule (100 mg total) by mouth 3 (three) times daily.   levETIRAcetam (KEPPRA) 500 MG tablet TAKE 1 TABLET EVERY 12 HOURS   lisinopril (ZESTRIL) 20 MG tablet TAKE 1 TABLET EVERY DAY   tamsulosin (FLOMAX) 0.4 MG CAPS capsule Take 1 capsule by mouth daily.   methocarbamol (ROBAXIN) 500 MG tablet TAKE 1 TABLET BY MOUTH EVERY 6 HOURS AS NEEDED FOR MUSCLE SPASMS. (Patient not taking: Reported on 04/12/2021)   rosuvastatin (CRESTOR) 5 MG tablet TAKE 1 TABLET EVERY DAY (Patient not taking: Reported on 04/12/2021)   No facility-administered encounter medications on file as of 04/12/2021.    Allergies (verified) Dilantin [phenytoin]   History: Past Medical History:  Diagnosis Date   Arthritis    Left hand   BPH with urinary obstruction    sees Dr. 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 Timmothy Euler, clear. Negative Cologuard 04-28-17     CYSTOSCOPY WITH INSERTION OF UROLIFT N/A 07/25/2016   Procedure: CYSTOSCOPY WITH INSERTION OF UROLIFT x4;  Surgeon: Franchot Gallo, MD;  Location: [facility];  Service: Urology;  Laterality: N/A;   HERNIA REPAIR Bilateral    inguinal hernia - repair, [facility]- 20 yrs ago     [facility] evacuation  2010   INGUINAL HERNIA REPAIR  08/11/2011   Procedure: LAPAROSCOPIC INGUINAL HERNIA;  Surgeon: Madilyn Hook, DO;  Location: [facility];  Service: General;  Laterality: Right;   INGUINAL HERNIA REPAIR     KNEE SURGERY     MOLE REMOVAL     non melanoma   PILONIDAL CYST EXCISION     TONSILLECTOMY     Family History  Problem Relation Age of Onset   Stroke Father    Lung cancer Father    Anesthesia problems Neg Hx    Social History   Socioeconomic History   Marital status: Married    Spouse name: Not on file   Number of children: Not on file   Years of education: Not on file   Highest education level: Not on file  Occupational History   Not on file  Tobacco Use   Smoking status: Former    Packs/day: 0.50    Years: 20.00    Pack years: 10.00    Types: Cigarettes    Quit date: 08/08/1996    Years since quitting: 24.6   Smokeless tobacco: Never  Vaping Use   Vaping Use: Never used  Substance and Sexual Activity   Alcohol use: Yes    Alcohol/week: 0.0 standard drinks    Comment: glass of wine each night   Drug use: No   Sexual activity: Yes  Other Topics Concern   Not on file  Social History Narrative   Not on file   Social Determinants of Health   Financial Resource Strain: Low Risk    Difficulty of Paying Living Expenses: Not  hard at all  Food Insecurity: No Food Insecurity   Worried About Charity fundraiser in the Last Year: Never true   [place] in the Last Year: Never true  Transportation Needs: No Transportation Needs   Lack of Transportation (Medical): No   Lack of Transportation (Non-Medical): No  Physical Activity: Inactive   Days of Exercise per Week: 0 days   Minutes of Exercise per Session: 0 min  Stress: No Stress Concern Present   Feeling of Stress : Not at all  Social Connections: Not on file    Tobacco Counseling Counseling given: Not Answered  Clinical Intake:  Pre-visit preparation completed: Yes  Pain : No/denies pain     Nutritional Status: BMI of 19-24  Normal Nutritional Risks: None Diabetes: No  How often do you need to have someone help you when you read instructions, pamphlets, or other written materials from your doctor or pharmacy?: 1 - Never What is the last grade level you completed in school?: college  Diabetic? no  Interpreter Needed?: No  Information entered by :: NAllen LPN   Activities of Daily Living In your present state of health, do you have any difficulty performing the following activities: 04/12/2021  Hearing? N  Vision? N  Difficulty concentrating or making decisions? N  Walking or climbing stairs? N  Dressing or bathing? N  Doing errands, shopping? N  Preparing Food and eating ? N  Using the Toilet? N  In the past six months, have you accidently leaked urine? N  Do you have problems with loss of bowel control? N  Managing your Medications? N  Managing your Finances? N  Housekeeping or managing your Housekeeping? N  Some recent data might be hidden    Patient Care Team: [name], MD as PCP - General (Family Medicine) Viona Gilmore, [facility] as Pharmacist (Pharmacist)  Indicate any recent Medical Services you may have received from other than [facility] providers in the past year (date may be approximate).     Assessment:    This is a routine wellness examination for Derek Cox.  Hearing/Vision screen Vision Screening - Comments:: Regular eye exams,  Dietary issues and exercise activities discussed: Current Exercise Habits: The patient does not participate in regular exercise at present   Goals Addressed             This Visit's Progress    Patient Stated       04/12/2021, no goals       Depression Screen PHQ 2/9 Scores 04/12/2021 06/17/2019 04/16/2018 12/02/2014  PHQ - 2 Score 0 0 0 0    Fall Risk Fall Risk  04/12/2021 06/17/2019 04/16/2018 02/09/2018 10/17/2016  Falls in the past year? 0 1 0 0 No  Comment - - - [place] Telephone Survey: data to providers prior to load [employer] Telephone Survey: data to providers prior to load  Number falls in past yr: - 1 - - -  Comment - 4 - - -  Injury with Fall? - 0 - - -  Risk for fall due to : Medication side effect History of fall(s) - - -  Follow up Education provided;Falls evaluation completed;Falls prevention discussed Falls evaluation completed - - -    FALL RISK PREVENTION PERTAINING TO THE HOME:  Any stairs in or around the home? No  If so, are there any without handrails? N/a Home free of loose throw rugs in walkways, pet beds, electrical cords, etc? Yes  Adequate lighting in your home to reduce risk of falls? Yes   ASSISTIVE DEVICES UTILIZED TO PREVENT FALLS:  Life alert? No  Use of a cane, walker or w/c? No  Grab bars in the bathroom? No  Shower chair or bench in shower? No  Elevated toilet seat or a handicapped toilet? Yes   TIMED UP AND GO:  Was the test performed? No .       Cognitive Function:        Immunizations Immunization History  Administered Date(s) Administered   PFIZER(Purple Top)SARS-COV-2 Vaccination 04/14/2019, 05/02/2019, 01/29/2020   Pneumococcal Conjugate-13 04/16/2018   Tdap 05/29/2015    TDAP  status: Up to date  Flu Vaccine status: Declined, Education has been provided regarding the importance of this vaccine but  patient still declined. Advised may receive this vaccine at local pharmacy or Health Dept. Aware to provide a copy of the vaccination record if obtained from local pharmacy or Health Dept. Verbalized acceptance and understanding.  Pneumococcal vaccine status: Up to date  Covid-19 vaccine status: Completed vaccines  Qualifies for Shingles Vaccine? Yes   Zostavax completed No   Shingrix Completed?: No.    Education has been provided regarding the importance of this vaccine. Patient has been advised to call insurance company to determine out of pocket expense if they have not yet received this vaccine. Advised may also receive vaccine at local pharmacy or Health Dept. Verbalized acceptance and understanding.  Screening Tests Health Maintenance  Topic Date Due   Hepatitis C Screening  Never done   Zoster Vaccines- Shingrix (1 of 2) Never done   Pneumonia Vaccine 44+ Years old (2 - PPSV23 if available, else PCV20) 04/17/2019   COVID-19 Vaccine (4 - Booster for Pfizer series) 04/28/2021 (Originally 03/25/2020)   INFLUENZA VACCINE  06/25/2021 (Originally 10/26/2020)   TETANUS/TDAP  05/28/2025   HPV VACCINES  Aged Out    Health Maintenance  Health Maintenance Due  Topic Date Due   Hepatitis C Screening  Never done   Zoster Vaccines- Shingrix (1 of 2) Never done   Pneumonia Vaccine 24+ Years old (2 - PPSV23 if available, else PCV20) 04/17/2019    Colorectal cancer screening: No longer required.   Lung Cancer Screening: (Low Dose CT Chest recommended if Age 25-80 years, 30 pack-year currently smoking OR have quit w/in 15years.) does not qualify.   Lung Cancer Screening Referral: no   Additional Screening:  Hepatitis C Screening: does qualify; Completed   Vision Screening: Recommended annual ophthalmology exams for early detection of glaucoma and other disorders of the eye. Is the patient up to date with their annual eye exam?  Yes  Who is the provider or what is the name of the office  in which the patient attends annual eye exams?  If pt is not established with a provider, would they like to be referred to a provider to establish care? No .   Dental Screening: Recommended annual dental exams for proper oral hygiene  Community Resource Referral / Chronic Care Management: CRR required this visit?  No   CCM required this visit?  No      Plan:     I have personally reviewed and noted the following in the patients chart:   Medical and social history Use of alcohol, tobacco or illicit drugs  Current medications and supplements including opioid prescriptions. Patient is not currently taking opioid prescriptions. Functional ability and status Nutritional status Physical activity Advanced directives List of other physicians Hospitalizations, surgeries, and ER visits in previous 12 months Vitals Screenings to include cognitive, depression, and falls Referrals and appointments  In addition, I have reviewed and discussed with patient certain preventive protocols, quality metrics, and best practice recommendations. A written personalized care plan for preventive services as well as general preventive health recommendations were provided to patient.     Kellie Simmering, LPN   04/15/4172   Nurse Notes: 6 CIT not administered. Patient appeared cognitive per conversation.

## 2021-04-12 NOTE — Patient Instructions (Signed)
[name] , Thank you for taking time to come for your Medicare Wellness Visit. I appreciate your ongoing commitment to your health goals. Please review the following plan we discussed and let me know if I can assist you in the future.   Screening recommendations/referrals: Colonoscopy: not required Recommended yearly ophthalmology/optometry visit for glaucoma screening and checkup Recommended yearly dental visit for hygiene and checkup  Vaccinations: Influenza vaccine: decline Pneumococcal vaccine: completed 04/16/2018 Tdap vaccine: completed 05/29/2015, due 05/28/2025 Shingles vaccine: discussed   Covid-19:  01/29/2020, 05/02/2019, 04/14/2019  Advanced directives: Please bring a copy of your POA (Power of Attorney) and/or Living Will to your next appointment.   Conditions/risks identified: none  Next appointment: Follow up in one year for your annual wellness visit.   Preventive Care 33 Years and Older, Male Preventive care refers to lifestyle choices and visits with your health care provider that can promote health and wellness. What does preventive care include? A yearly physical exam. This is also called an annual well check. Dental exams once or twice a year. Routine eye exams. Ask your health care provider how often you should have your eyes checked. Personal lifestyle choices, including: Daily care of your teeth and gums. Regular physical activity. Eating a healthy diet. Avoiding tobacco and drug use. Limiting alcohol use. Practicing safe sex. Taking low doses of aspirin every day. Taking vitamin and mineral supplements as recommended by your health care provider. What happens during an annual well check? The services and screenings done by your health care provider during your annual well check will depend on your age, overall health, lifestyle risk factors, and family history of disease. Counseling  Your health care provider may ask you questions about your: Alcohol  use. Tobacco use. Drug use. Emotional well-being. Home and relationship well-being. Sexual activity. Eating habits. History of falls. Memory and ability to understand (cognition). Work and work Statistician. Screening  You may have the following tests or measurements: Height, weight, and BMI. Blood pressure. Lipid and cholesterol levels. These may be checked every 5 years, or more frequently if you are over 102 years old. Skin check. Lung cancer screening. You may have this screening every year starting at age 67 if you have a 30-pack-year history of smoking and currently smoke or have quit within the past 15 years. Fecal occult blood test (FOBT) of the stool. You may have this test every year starting at age 61. Flexible sigmoidoscopy or colonoscopy. You may have a sigmoidoscopy every 5 years or a colonoscopy every 10 years starting at age 7. Prostate cancer screening. Recommendations will vary depending on your family history and other risks. Hepatitis C blood test. Hepatitis B blood test. Sexually transmitted disease (STD) testing. Diabetes screening. This is done by checking your blood sugar (glucose) after you have not eaten for a while (fasting). You may have this done every 1-3 years. Abdominal aortic aneurysm (AAA) screening. You may need this if you are a current or former smoker. Osteoporosis. You may be screened starting at age 56 if you are at high risk. Talk with your health care provider about your test results, treatment options, and if necessary, the need for more tests. Vaccines  Your health care provider may recommend certain vaccines, such as: Influenza vaccine. This is recommended every year. Tetanus, diphtheria, and acellular pertussis (Tdap, Td) vaccine. You may need a Td booster every 10 years. Zoster vaccine. You may need this after age 79. Pneumococcal 13-valent conjugate (PCV13) vaccine. One dose is recommended after  age 61. Pneumococcal polysaccharide  (PPSV23) vaccine. One dose is recommended after age 12. Talk to your health care provider about which screenings and vaccines you need and how often you need them. This information is not intended to replace advice given to you by your health care provider. Make sure you discuss any questions you have with your health care provider. Document Released: 04/10/2015 Document Revised: 12/02/2015 Document Reviewed: 01/13/2015 Elsevier Interactive Patient Education  2017 [place] Prevention in the Home Falls can cause injuries. They can happen to people of all ages. There are many things you can do to make your home safe and to help prevent falls. What can I do on the outside of my home? Regularly fix the edges of walkways and driveways and fix any cracks. Remove anything that might make you trip as you walk through a door, such as a raised step or threshold. Trim any bushes or trees on the path to your home. Use bright outdoor lighting. Clear any walking paths of anything that might make someone trip, such as rocks or tools. Regularly check to see if handrails are loose or broken. Make sure that both sides of any steps have handrails. Any raised decks and porches should have guardrails on the edges. Have any leaves, snow, or ice cleared regularly. Use sand or salt on walking paths during winter. Clean up any spills in your garage right away. This includes oil or grease spills. What can I do in the bathroom? Use night lights. Install grab bars by the toilet and in the tub and shower. Do not use towel bars as grab bars. Use non-skid mats or decals in the tub or shower. If you need to sit down in the shower, use a plastic, non-slip stool. Keep the floor dry. Clean up any water that spills on the floor as soon as it happens. Remove soap buildup in the tub or shower regularly. Attach bath mats securely with double-sided non-slip rug tape. Do not have throw rugs and other things on the  floor that can make you trip. What can I do in the bedroom? Use night lights. Make sure that you have a light by your bed that is easy to reach. Do not use any sheets or blankets that are too big for your bed. They should not hang down onto the floor. Have a firm chair that has side arms. You can use this for support while you get dressed. Do not have throw rugs and other things on the floor that can make you trip. What can I do in the kitchen? Clean up any spills right away. Avoid walking on wet floors. Keep items that you use a lot in easy-to-reach places. If you need to reach something above you, use a strong step stool that has a grab bar. Keep electrical cords out of the way. Do not use floor polish or wax that makes floors slippery. If you must use wax, use non-skid floor wax. Do not have throw rugs and other things on the floor that can make you trip. What can I do with my stairs? Do not leave any items on the stairs. Make sure that there are handrails on both sides of the stairs and use them. Fix handrails that are broken or loose. Make sure that handrails are as long as the stairways. Check any carpeting to make sure that it is firmly attached to the stairs. Fix any carpet that is loose or worn. Avoid having throw rugs  at the top or bottom of the stairs. If you do have throw rugs, attach them to the floor with carpet tape. Make sure that you have a light switch at the top of the stairs and the bottom of the stairs. If you do not have them, ask someone to add them for you. What else can I do to help prevent falls? Wear shoes that: Do not have high heels. Have rubber bottoms. Are comfortable and fit you well. Are closed at the toe. Do not wear sandals. If you use a stepladder: Make sure that it is fully opened. Do not climb a closed stepladder. Make sure that both sides of the stepladder are locked into place. Ask someone to hold it for you, if possible. Clearly mark and make  sure that you can see: Any grab bars or handrails. First and last steps. Where the edge of each step is. Use tools that help you move around (mobility aids) if they are needed. These include: Canes. Walkers. Scooters. Crutches. Turn on the lights when you go into a dark area. Replace any light bulbs as soon as they burn out. Set up your furniture so you have a clear path. Avoid moving your furniture around. If any of your floors are uneven, fix them. If there are any pets around you, be aware of where they are. Review your medicines with your doctor. Some medicines can make you feel dizzy. This can increase your chance of falling. Ask your doctor what other things that you can do to help prevent falls. This information is not intended to replace advice given to you by your health care provider. Make sure you discuss any questions you have with your health care provider. Document Released: 01/08/2009 Document Revised: 08/20/2015 Document Reviewed: 04/18/2014 Elsevier Interactive Patient Education  2017 [employer].

## 2021-04-26 DIAGNOSIS — L111 Transient acantholytic dermatosis [Grover]: Secondary | ICD-10-CM | POA: Diagnosis not present

## 2021-04-26 DIAGNOSIS — Z23 Encounter for immunization: Secondary | ICD-10-CM | POA: Diagnosis not present

## 2021-04-26 DIAGNOSIS — L570 Actinic keratosis: Secondary | ICD-10-CM | POA: Diagnosis not present

## 2021-04-26 DIAGNOSIS — L821 Other seborrheic keratosis: Secondary | ICD-10-CM | POA: Diagnosis not present

## 2021-04-26 DIAGNOSIS — D225 Melanocytic nevi of trunk: Secondary | ICD-10-CM | POA: Diagnosis not present

## 2021-04-26 DIAGNOSIS — Z85828 Personal history of other malignant neoplasm of skin: Secondary | ICD-10-CM | POA: Diagnosis not present

## 2021-04-26 DIAGNOSIS — L814 Other melanin hyperpigmentation: Secondary | ICD-10-CM | POA: Diagnosis not present

## 2021-04-29 ENCOUNTER — Ambulatory Visit (INDEPENDENT_AMBULATORY_CARE_PROVIDER_SITE_OTHER): Payer: Medicare Other | Admitting: Neurology

## 2021-04-29 ENCOUNTER — Other Ambulatory Visit: Payer: Self-pay

## 2021-04-29 ENCOUNTER — Encounter: Payer: Self-pay | Admitting: Neurology

## 2021-04-29 VITALS — BP 123/79 | HR 79 | Ht 70.0 in | Wt 168.2 lb

## 2021-04-29 DIAGNOSIS — R258 Other abnormal involuntary movements: Secondary | ICD-10-CM

## 2021-04-29 DIAGNOSIS — R251 Tremor, unspecified: Secondary | ICD-10-CM

## 2021-04-29 DIAGNOSIS — G8929 Other chronic pain: Secondary | ICD-10-CM

## 2021-04-29 DIAGNOSIS — E538 Deficiency of other specified B group vitamins: Secondary | ICD-10-CM

## 2021-04-29 DIAGNOSIS — M6289 Other specified disorders of muscle: Secondary | ICD-10-CM

## 2021-04-29 DIAGNOSIS — M5481 Occipital neuralgia: Secondary | ICD-10-CM

## 2021-04-29 DIAGNOSIS — R5383 Other fatigue: Secondary | ICD-10-CM | POA: Diagnosis not present

## 2021-04-29 DIAGNOSIS — R270 Ataxia, unspecified: Secondary | ICD-10-CM | POA: Diagnosis not present

## 2021-04-29 DIAGNOSIS — R269 Unspecified abnormalities of gait and mobility: Secondary | ICD-10-CM

## 2021-04-29 DIAGNOSIS — W19XXXA Unspecified fall, initial encounter: Secondary | ICD-10-CM

## 2021-04-29 DIAGNOSIS — M545 Low back pain, unspecified: Secondary | ICD-10-CM

## 2021-04-29 DIAGNOSIS — G1229 Other motor neuron disease: Secondary | ICD-10-CM

## 2021-04-29 DIAGNOSIS — G20 Parkinson's disease: Secondary | ICD-10-CM | POA: Diagnosis not present

## 2021-04-29 NOTE — Progress Notes (Signed)
[facility]    Provider:  Dr Jaynee Eagles Requesting Provider: [name], MD Primary Care Provider:  [name], MD  CC:  Patient states the pain is not in the temple area but in the left back of the head and radiates up the back of the head  HPI:  Derek Cox is a 76 y.o. male here as requested by [name], MD for Trigeminal neuralgia(he gives a different history and location more occipital) started on gabapentin from his primary care.   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    His wife is here and provides much information, the pain starts in the back of the left ear brief and shoots up, severe like a lightning. He has had 2 neck surgeries and 3 low back surgeries. He has had shots with Dr. Nelva Bush in the past. He started getting pain the back of the head on the left encompassing the whole ear. It has tapered off. He is better as far as occipital neuralgia. He doesn't take the gabapentin, it happened last a couple days ago, severe, brief, are radiates to the left side of the head. Not into the temple or around the eye. Verified with wife and patient not into the temple. No vision changes. He is a patient of Dr. Arnoldo Morale, he does not want to return to Dr. Nelva Bush, I encouraged him to talk to Dr. Arnoldo Morale about further pain management in their office they can perform injections and have a physician who does this. The headache pain is new. No tremor at rest. He has some tremor with action.He has been falling, fallen 3 times in the last week, his handwriting. Slow walking has become worse over the years, not as a result of his stroke. Patient says his memory is not impaired. Wife also says his memory is not impaired past what would be expected for age. Also naps many days, falls asleep  sititn gup, no snoring, very fatigued. No other focal neurologic deficits, associated symptoms, inciting events or modifiable factors.   Reviewed notes, labs and imaging from outside physicians, which showed:   I reviewed Dr. Barbie Banner notes, about 3 weeks ago he began to have intermittent sharp electric pains in the left temple area, these last about 10 seconds at a time, no vision changes, neurologic deficits.  He was started on gabapentin.  Review of Systems: Patient complains of symptoms per HPI as well as the following symptoms neck and back pain. Pertinent negatives and positives per HPI. All others negative.   Social History   Socioeconomic History   Marital status: Married    Spouse name: Not on file   Number of children: Not on file   Years of education: Not on file   Highest education level: Not on file  Occupational History   Not on file  Tobacco Use   Smoking status: Former    Packs/day: 0.50    Years: 20.00    Pack years: 10.00    Types: Cigarettes    Quit date: 08/08/1996    Years since quitting: 24.7   Smokeless tobacco: Never  Vaping Use   Vaping Use: Never used  Substance and Sexual Activity   Alcohol use: Yes    Alcohol/week: 21.0 standard drinks    Types: 21 Glasses of wine per week    Comment: glass  of wine each night   Drug use: No   Sexual activity: Yes  Other Topics Concern   Not on file  Social History Narrative   Not on file   Social Determinants of Health   Financial Resource Strain: Low Risk    Difficulty of Paying Living Expenses: Not hard at all  Food Insecurity: No Food Insecurity   Worried About Charity fundraiser in the Last Year: Never true   [place] in the Last Year: Never true  Transportation Needs: No Transportation Needs   Lack of Transportation (Medical): No   Lack of Transportation (Non-Medical): No  Physical Activity: Inactive   Days of Exercise per Week: 0 days   Minutes of Exercise per Session: 0 min  Stress: No  Stress Concern Present   Feeling of Stress : Not at all  Social Connections: Not on file  Intimate Partner Violence: Not on file    Family History  Problem Relation Age of Onset   Stroke Father    Lung cancer Father    Anesthesia problems Neg Hx     Past Medical History:  Diagnosis Date   Arthritis    Left hand   BPH with urinary obstruction    sees Dr. 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, no CPAP   Stroke ([place])    2010, treated at [facility] for evacuation of [facility], then rehabed at [place], no residual deficits     Patient Active Problem List   Diagnosis Date Noted   Trigeminal neuralgia of left side of face 02/09/2021   Spondylolisthesis of lumbar region 11/06/2019   Pain in right foot 08/14/2018   Erectile dysfunction 09/13/2017   Degeneration of lumbar intervertebral disc 06/22/2017   Low back pain 04/12/2017   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   HTN (hypertension) 12/02/2014   Hyperlipidemia 12/02/2014   Hx of completed stroke 12/02/2014   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/2014   Insomnia 12/02/2014   Hx of seizure disorder 12/02/2014    Past Surgical History:  Procedure Laterality Date   ANTERIOR CERVICAL DECOMP/DISCECTOMY FUSION N/A 11/15/2012   Procedure: ANTERIOR CERVICAL DECOMPRESSION/DISCECTOMY FUSION 1 LEVEL,CERVICAL FOUR-FIVE;  Surgeon: Ophelia Charter, MD;  Location: [facility];  Service: Neurosurgery;  Laterality: N/A;   BRAIN HEMATOMA EVACUATION Left 2010   BRAIN SURGERY     bleeding on brain   CERVICAL FUSION  2007   2 surgeries   COLONOSCOPY  2006   per Dr. Timmothy Euler,  clear. Negative Cologuard 04-28-17     CYSTOSCOPY WITH INSERTION OF UROLIFT N/A 07/25/2016   Procedure: CYSTOSCOPY WITH INSERTION OF UROLIFT x4;  Surgeon: Franchot Gallo, MD;  Location: [facility];  Service: Urology;  Laterality: N/A;   HERNIA REPAIR Bilateral    inguinal hernia - repair, [facility]- 20 yrs ago     [place] evacuation  2010   INGUINAL HERNIA REPAIR  08/11/2011   Procedure: LAPAROSCOPIC INGUINAL HERNIA;  Surgeon: Madilyn Hook, DO;  [facility]: [facility]  OR;  Service: General;  Laterality: Right;   INGUINAL HERNIA REPAIR     KNEE SURGERY     MOLE REMOVAL     non melanoma   PILONIDAL CYST EXCISION     TONSILLECTOMY      Current Outpatient Medications  Medication Sig Dispense Refill   ALPRAZolam (XANAX) 1 MG tablet Take 1 tablet (1 mg total) by mouth at bedtime as needed for sleep. 90 tablet 1   levETIRAcetam (KEPPRA) 500 MG tablet TAKE 1 TABLET EVERY 12 HOURS 180 tablet 3   lisinopril (ZESTRIL) 20 MG tablet TAKE 1 TABLET EVERY DAY 90 tablet 3   rosuvastatin (CRESTOR) 5 MG tablet TAKE 1 TABLET EVERY DAY (Patient taking differently: 5 mg.) 90 tablet 3   tamsulosin (FLOMAX) 0.4 MG CAPS capsule Take 1 capsule by mouth daily. 90 capsule 1   gabapentin (NEURONTIN) 100 MG capsule Take 1 capsule (100 mg total) by mouth 3 (three) times daily. (Patient not taking: Reported on 04/30/2021) 90 capsule 3   methocarbamol (ROBAXIN) 500 MG tablet TAKE 1 TABLET BY MOUTH EVERY 6 HOURS AS NEEDED FOR MUSCLE SPASMS. (Patient not taking: Reported on 04/30/2021) 60 tablet 2   No current facility-administered medications for this visit.    Allergies as of 04/29/2021 - Review Complete 04/29/2021  Allergen Reaction Noted   Dilantin [phenytoin] Other (See Comments) 08/09/2011    Vitals: BP 123/79    Pulse 79    Ht 5\' 10"  (1.778 m)    Wt 168 lb 3.2 oz (76.3 kg)    BMI 24.13 kg/m  Last Weight:  Wt Readings from Last 1 Encounters:  04/29/21 168 lb 3.2 oz (76.3 kg)   Last Height:   Ht Readings from Last 1 Encounters:   04/29/21 5\' 10"  (1.778 m)     Physical exam: Exam: Gen: NAD, conversant, well nourised, well groomed                     CV: RRR, no MRG. No Carotid Bruits. No peripheral edema, warm, nontender Eyes: Conjunctivae clear without exudates or hemorrhage  Neuro: Detailed Neurologic Exam  Speech:    Speech is normal; fluent and spontaneous with normal comprehension.  Cognition:    The patient is oriented to person, place, and time;     recent and remote memory impaired;     language fluent;     Impaired attention, concentration Cranial Nerves: hypomimia    The pupils are equal, round, and reactive to light. Pupils too small to visualize fundi. Visual fields are impaired to finger confrontation, but difficult exam due to what appears to be cognitive problems. Impaired upgaze, otherwise extraocular movements are intact. Trigeminal sensation is intact and the muscles of mastication are normal. The face is symmetric. The palate elevates in the midline. Hearing impaired. Voice is normal. Shoulder shrug is normal. The tongue has normal motion without fasciculations.   Coordination:    Can touch finger to nose with a little bit more difficulty on the left. Can perform heel to shin.   Gait:     Slightly stooped, decreased arm swing, not classically shuffling but low clearance and narrow, imbalance  Motor Observation:    No asymmetry, no atrophy, slight action tremor on FTN Tone:   Increased right arm     Strength: right triceps 4/5. Slightly weaker right grip. Right hip flexion 3+/5, right leg flexion 3+/5. Otherwise strength is V/V in the upper and lower limbs.      Sensation:  intact to LT     Reflex Exam:  DTR's:   Absent lowers, trace uppers Toes:    Right down, left up.  Clonus:    Clonus is absent.    Assessment/Plan:  76 y.o. male here as requested by [name], MD for Trigeminal neuralgia(he gives a different history today and location more occipital and  temporo-parietal not strictly temporal) started on gabapentin from his primary care. He is more worried however about his gait and falls.  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    Gait abnormalty: Falls, hypomimia, small handwriting, stooped posture, low clearance and narrow base, multiple fally, bradykinesia. Will MRI brain and cervical spine for othre etiologies of his parkinsonism but also discussed DAT scan. Also Physical Therapy. Left upgoing toe indicating upper motor neuron lesion. They both denied any memory changes in patient.   Occipital neuralgia: Improving, discussed etiology, he has had multiple neck and back surgeries, discussed conservative measures. He is a patient of Dr. Arnoldo Morale, he does not want to return to Dr. Nelva Bush, I encouraged him to talk to Dr. Arnoldo Morale about further pain management in their office they can perform injections and have a physician who does this.    Orders Placed This Encounter  Procedures   MR BRAIN W WO CONTRAST   MR CERVICAL SPINE WO CONTRAST   CBC with Differential/Platelets   Comprehensive metabolic panel   TSH   X72 and Folate Panel   Methylmalonic acid, serum   Ambulatory referral to Physical Therapy     Cc: [name], MD,  [name], MD  Sarina Ill, MD  [facility] [address] [place], [place] [address]  Phone [phone] Fax [phone]  I spent over 60 minutes of face-to-face and non-face-to-face time with patient on the  1. Gait abnormality   2. Chronic midline low back pain without sciatica   3. Fall, initial encounter   4. Other fatigue   5. Occipital neuralgia of left side   6. screen for B12 deficiency   7. Bradykinesia   8. Parkinsonism, unspecified Parkinsonism type ([place])   9. Ataxia    10. Abnormal increased muscle tone   11. Tremor   12. Upper motor neuron lesion (HCC)    diagnosis.  This included previsit chart review, lab review, study review, order entry, electronic health record documentation, patient education on the different diagnostic and therapeutic options, counseling and coordination of care, risks and benefits of management, compliance, or risk factor reduction

## 2021-04-29 NOTE — Patient Instructions (Addendum)
MRI of the brain and cervical spine. If negative I would recommend a DAT scan(see below) Physical Therapy Return in 3 months  Brain DaTscan How to prepare and what to expect What is a brain DaTscan? A brain DaTscan is a nuclear medicine scan. It uses radioactive material to diagnose some diseases of the brain, especially those that cause tremor (shakiness). DaTscan is a brand name for a drug called ioflupane I-123. A brain DaTscan is a form of radiology, because radiation is used to take pictures of the body. This radioactive drug is ordered especially for you. Because of this, we need at least 72 hours notice if you must cancel or reschedule your scan.   How does the scan work? You will be given a small dose of tracer (radioactive material) through an intravenous (IV) line. This tracer will collect in part of your brain and give off gamma rays. A special camera called a gamma camera will use these rays to produce pictures and measurements of your brain. How do I prepare? Some drugs will affect the results of your brain DaTscan. You will need to stop taking these drugs before your scan. The table on page 2 lists the drugs that need to be stopped, and for how many days before your scan. This list is in alphabetical order by the generic name of the drug. The common brand names are listed beneath the generic name. Please confirm these instructions with your doctor who prescribed the drug. Drugs to Stop Taking Before your scan, stop taking these medicines for the length of time shown: Name of Drug Stop Taking  Amoxapine 4 days before  Benztropine  Cogentin 3 days before  Bupropion (Aplenzin, Budeprion, Voxra, Wellbutrin, Zyban) 48 hours before  Buspirone 15 hours before  Citalopram 24 hours before  Cocaine 6 hours before  Escitalopram 24 hours before  Methamphetamine 24 hours before  Methylphenidate (Concerta, Metadate, Methylin, Ritalin) 20 hours before  Paroxetine 24 hours before   Selegilene 48 hours before  Sertraline 3 days before  If you are breastfeeding, or if there is any chance you are pregnant, please tell the scheduler or technologist (the person who will help you prepare for your scan). How is the scan done? When you first arrive, we will ask you to drink a small cup of water with potassium iodine in it. This water may have a metallic taste.  An hour after you drink the potassium iodine water, the technologist will inject a small amount of tracer into a vein in your arm or hand through your IV.  You must stay in the department for 30 minutes after the injection.  You will then have a break for 3 hours. It is OK to eat and drink during this break.  You must return to the clinic after this 3-hour break to have images of your brain taken.  Then, 4 hours after you receive your tracer injection, the technologist will take images of your brain with the gamma camera. You will lie flat on the exam table while these images are being taken.  You must not move while the camera is taking pictures. If you move, the pictures will be blurry and may have to be taken again.  Taking the images will take 40 to 45 minutes. Your total time in the imaging room will be about 1 hour.  You may also have a low-dose CT scan of your brain to help confirm any results. A CT scan is another way to take images  inside your body.  It will take about 5 hours from the time you drink the potassium iodine water until the scans are complete. What will I feel during the scan? The technologist will help make you as comfortable as possible on the exam table for the scan.  You may feel some minor discomfort from the IV.  Lying still on the exam table may be hard for some patients.  The camera will be close to your head. This may make you feel confined or uneasy (claustrophobic). Please tell the doctor who referred you for this scan if you know you are claustrophobic. Are there any side effects from the  scan? Most of the radioactivity from the tracer will pass out of your body in your urine or stool. The rest simply goes away over time.  Bad reactions to this scan are very rare. Fewer than 1% of patients (fewer than 1 out of 100) have a bad reaction. Reactions may include headache, nausea, vertigo (dizziness), or dry mouth. How do I get the results? When the test is over, the nuclear medicine doctor will review your images, prepare a written report, and talk with your doctor about the results. Your doctor will then talk with you about the results and your treatment options. If you needed to stop taking any medicines on the day of your scan, ask your doctor when to start taking them again.  The potentially interfering drugs consist of: amoxapine, amphetamine, benztropine, bupropion, buspirone, citalopram, cocaine, mazindol, methamphetamine, methylphenidate, norephedrine, phentermine, escitalopram,  phenylpropanolamine, selegiline, paroxetine, and sertraline

## 2021-04-30 ENCOUNTER — Other Ambulatory Visit: Payer: Self-pay

## 2021-04-30 ENCOUNTER — Ambulatory Visit: Payer: Medicare Other | Attending: Neurology | Admitting: Physical Therapy

## 2021-04-30 DIAGNOSIS — R2689 Other abnormalities of gait and mobility: Secondary | ICD-10-CM | POA: Diagnosis not present

## 2021-04-30 DIAGNOSIS — M6281 Muscle weakness (generalized): Secondary | ICD-10-CM | POA: Diagnosis not present

## 2021-04-30 DIAGNOSIS — G8929 Other chronic pain: Secondary | ICD-10-CM | POA: Insufficient documentation

## 2021-04-30 DIAGNOSIS — M545 Low back pain, unspecified: Secondary | ICD-10-CM | POA: Diagnosis not present

## 2021-04-30 DIAGNOSIS — R293 Abnormal posture: Secondary | ICD-10-CM | POA: Diagnosis not present

## 2021-04-30 LAB — CBC WITH DIFFERENTIAL/PLATELET
Basophils Absolute: 0.1 10*3/uL (ref 0.0–0.2)
Basos: 1 %
EOS (ABSOLUTE): 0.2 10*3/uL (ref 0.0–0.4)
Eos: 2 %
Hematocrit: 42.4 % (ref 37.5–51.0)
Hemoglobin: 14.3 g/dL (ref 13.0–17.7)
Immature Grans (Abs): 0 10*3/uL (ref 0.0–0.1)
Immature Granulocytes: 0 %
Lymphocytes Absolute: 1.5 10*3/uL (ref 0.7–3.1)
Lymphs: 20 %
MCH: 30.8 pg (ref 26.6–33.0)
MCHC: 33.7 g/dL (ref 31.5–35.7)
MCV: 91 fL (ref 79–97)
Monocytes Absolute: 0.8 10*3/uL (ref 0.1–0.9)
Monocytes: 10 %
Neutrophils Absolute: 5 10*3/uL (ref 1.4–7.0)
Neutrophils: 67 %
Platelets: 227 10*3/uL (ref 150–450)
RBC: 4.64 x10E6/uL (ref 4.14–5.80)
RDW: 12.2 % (ref 11.6–15.4)
WBC: 7.5 10*3/uL (ref 3.4–10.8)

## 2021-04-30 NOTE — Therapy (Signed)
[facility] [address] [place], [place], [address] Phone: [phone]   Fax:  [phone]  Physical Therapy Evaluation  Patient Details  Name: Derek Cox MRN: [id number] Date of Birth: Jul 17, 1945 Referring Provider (PT): Melvenia Beam, MD   Encounter Date: 04/30/2021   PT End of Session - 04/30/21 0859     Visit Number 1    Number of Visits 16    Date for PT Re-Evaluation 06/25/21    Authorization Type Medicare    Progress Note Due on Visit 10    PT Start Time 0805    PT Stop Time 0845    PT Time Calculation (min) 40 min    Activity Tolerance Patient tolerated treatment well    Behavior During Therapy [facility] for tasks assessed/performed             Past Medical History:  Diagnosis Date   Arthritis    Left hand   BPH with urinary obstruction    sees Dr. 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   History of dysuria    post surgical   Hyperlipidemia    Hypertension    echo done - 10/10   Inguinal hernia    Seizures ([place])    after stroke , upon self d/c'ing Dilantin, since on Keppra, no repeat of seizure    Sleep apnea    minor sleep apnea - study before 2010, no CPAP   Stroke ([place])    2010, treated at [place] for evacuation of [facility], then rehabed at [facility], no residual deficits     Past Surgical History:  Procedure Laterality Date   ANTERIOR CERVICAL DECOMP/DISCECTOMY FUSION N/A 11/15/2012   Procedure: ANTERIOR CERVICAL DECOMPRESSION/DISCECTOMY FUSION 1 LEVEL,CERVICAL FOUR-FIVE;  Surgeon: Ophelia Charter, MD;  Location: [facility];  Service: Neurosurgery;  Laterality: N/A;   BRAIN HEMATOMA EVACUATION Left 2010   BRAIN SURGERY     bleeding on brain   CERVICAL FUSION  2007   2 surgeries   COLONOSCOPY  2006   per Dr. Timmothy Euler, clear. Negative Cologuard 04-28-17     CYSTOSCOPY WITH INSERTION OF  UROLIFT N/A 07/25/2016   Procedure: CYSTOSCOPY WITH INSERTION OF UROLIFT x4;  Surgeon: Franchot Gallo, MD;  Location: [facility];  Service: Urology;  Laterality: N/A;   HERNIA REPAIR Bilateral    inguinal hernia - repair, [facility]- 20 yrs ago     [facility] evacuation  2010   INGUINAL HERNIA REPAIR  08/11/2011   Procedure: LAPAROSCOPIC INGUINAL HERNIA;  Surgeon: Madilyn Hook, DO;  Location: [facility];  Service: General;  Laterality: Right;   INGUINAL HERNIA REPAIR     KNEE SURGERY     MOLE REMOVAL     non melanoma   PILONIDAL CYST EXCISION     TONSILLECTOMY      There were no vitals filed for this visit.    Subjective Assessment - 04/30/21 0809     Subjective Pt reports stroke in 2010 (cannot remember what was affected). Since then he's had 3 "events" -- a back surgery in 2021 and neck surgery x2.  Pt is unable to recall what the surgery was, chart review shows pt had PLIF L4-L5. Pt states increased pain occurred when trying to get up (sharp pain in the back of the legs). Pt reports back pain right around the belt line since  2021. Pt reports he is pain free currently but he was hurting a week ago. Pt was referred to therapy to address this pain. Doesn't hurt at night. Pt reports additional pain from his left ear down his neck but this has also resolved (chart review demos visit for trigeminal neuralgia).    Pertinent History PLIF L4-L5 in 2021, CVA 2010, ACDF C4-C5 in 2014, craniotomy s/p CVA in 2010    How long can you sit comfortably? no issues    How long can you stand comfortably? When pain was occurring it was within a few minutes.    How long can you walk comfortably? no issues    Patient Stated Goals Improve sit to stand without pain    Currently in Pain? No/denies   at worst 10/10 when coming from sit to stand   Pain Score 0-No pain    Pain Location Back    Pain Orientation Lower    Pain Descriptors / Indicators Aching;Shooting    Pain Type Chronic pain    Pain Radiating Towards back of bilat  legs    Pain Onset More than a month ago    Pain Frequency Intermittent    Aggravating Factors  sit to stand    Pain Relieving Factors Nothing -- it would last a few seconds with sit to stand    Effect of Pain on Daily Activities transfers                [facility] PT Assessment - 04/30/21 0001       Assessment   Medical Diagnosis M54.50,G89.29 (ICD-10-CM) - Chronic midline low back pain without sciatica  R26.9 (ICD-10-CM) - Gait abnormality  W19.Merril Abbe (ICD-10-CM) - Fall, initial encounter    Referring Provider (PT) Melvenia Beam, MD    Onset Date/Surgical Date --   2021   Prior Therapy 2010 for CVA      Precautions   Precautions Fall      Restrictions   Weight Bearing Restrictions No      Balance Screen   Has the patient fallen in the past 6 months Yes   tripped over steps   How many times? 3    Has the patient had a decrease in activity level because of a fear of falling?  No    Is the patient reluctant to leave their home because of a fear of falling?  No      Home Environment   Living Environment Private residence    Living Arrangements Spouse/significant other    Available Help at Discharge Family    Type of [place] Access Level entry    [place] One level      Prior [place] Retired    Leisure Youth worker work      Observation/Other Assessments   Focus on Therapeutic Outcomes (FOTO)  n/a      Sensation   Light Touch Appears Intact      Posture/Postural Control   Posture/Postural Control Postural limitations    Postural Limitations Flexed trunk;Posterior pelvic tilt;Decreased lumbar lordosis      ROM / Strength   AROM / PROM / Strength Strength      Strength   Strength Assessment Site Hip;Knee;Ankle    Right/Left Hip Right;Left    Right Hip Flexion 4-/5    Right Hip Extension 2+/5    Right Hip ABduction 3+/5   compensates with hip flexors   Left Hip Flexion 4+/5  Left Hip Extension 2+/5    Left Hip ABduction 3/5   compensates  with hip flexors   Right/Left Knee Right;Left    Right Knee Flexion 5/5    Right Knee Extension 5/5    Left Knee Flexion 4/5    Left Knee Extension 4+/5    Right/Left Ankle Right;Left    Right Ankle Dorsiflexion 5/5    Right Ankle Plantar Flexion 4/5    Left Ankle Dorsiflexion 4-/5    Left Ankle Plantar Flexion 4/5      Palpation   Spinal mobility hypomobile throughout      Special Tests    Special Tests Lumbar    Lumbar Tests FABER test;Prone Knee Bend Test;Straight Leg Raise      FABER test   findings Negative    Comment R>L hip tightness      Prone Knee Bend Test   Findings Negative    Comment bilat quad tightness (able to flex to ~100 deg bilat)      Straight Leg Raise   Findings Negative    Comment bilat hamstring tightness (SLR ~70 deg bilat)      Bed Mobility   Bed Mobility Rolling Right;Rolling Left;Supine to Sit;Sit to Supine    Rolling Right Contact Guard/Touching assist   Required increased time to get in position, CGA to position hips to get into complete sidelying   Rolling Left Contact Guard/Touching assist   Required increased time to get in position, CGA to position hips to get into complete sidelying   Supine to Sit Contact Guard/Touching assist   Slow with some increase in pain   Sit to Supine Contact Guard/Touching assist   Slow with increased pain upon complete supine (only tolerated ~5 min)     Transfers   Transfers Sit to Stand    Sit to Stand 5: Supervision    Sit to Stand Details (indicate cue type and reason) Pushes off with hands for lift off    Five time sit to stand comments  19 sec   no pain today; needs to use UEs     Ambulation/Gait   Ambulation Distance (Feet) --   Throughout gym during eval   Assistive device None    Gait Pattern Step-through pattern;Decreased stride length;Decreased weight shift to right;Lateral hip instability;Trunk flexed    Ambulation Surface Level;Indoor    Gait velocity 0.55 m/s    Stairs Yes    Stairs  Assistance 5: Supervision    Stair Management Technique Alternating pattern;Two rails    Number of Stairs 4    Height of Stairs 6      Standardized Balance Assessment   Standardized Balance Assessment Dynamic Gait Index      Functional Gait  Assessment   Gait assessed  Yes    Gait Level Surface Walks 20 ft, slow speed, abnormal gait pattern, evidence for imbalance or deviates 10-15 in outside of the 12 in walkway width. Requires more than 7 sec to ambulate 20 ft.    Change in Gait Speed Able to change speed, demonstrates mild gait deviations, deviates 6-10 in outside of the 12 in walkway width, or no gait deviations, unable to achieve a major change in velocity, or uses a change in velocity, or uses an assistive device.    Gait with Horizontal Head Turns Performs head turns smoothly with no change in gait. Deviates no more than 6 in outside 12 in walkway width    Gait with Vertical Head Turns Performs head turns with  no change in gait. Deviates no more than 6 in outside 12 in walkway width.    Gait and Pivot Turn Pivot turns safely within 3 sec and stops quickly with no loss of balance.    Step Over Obstacle Is able to step over 2 stacked shoe boxes taped together (9 in total height) without changing gait speed. No evidence of imbalance.    Gait with Narrow Base of Support Ambulates 7-9 steps.    Gait with Eyes Closed Walks 20 ft, slow speed, abnormal gait pattern, evidence for imbalance, deviates 10-15 in outside 12 in walkway width. Requires more than 9 sec to ambulate 20 ft.    Ambulating Backwards Walks 20 ft, slow speed, abnormal gait pattern, evidence for imbalance, deviates 10-15 in outside 12 in walkway width.    Steps Alternating feet, must use rail.    Total Score 21    FGA comment: 21/30                        Objective measurements completed on examination: See above findings.                PT Education - 04/30/21 0859     Education Details  Discussed exam findings and POC    Person(s) Educated Patient    Methods Explanation;Demonstration;Tactile cues;Verbal cues    Comprehension Verbalized understanding;Returned demonstration;Verbal cues required;Tactile cues required;Need further instruction              PT Short Term Goals - 04/30/21 0908       PT SHORT TERM GOAL #1   Title Pt will be independent with initial HEP    Time 4    Period Weeks    Status New    Target Date 05/28/21      PT SHORT TERM GOAL #2   Title Pt will have improved 5x STS to </=15 sec without UE use to demo improving functional LE strength    Baseline 19 sec with use of UEs    Time 4    Period Weeks    Status New    Target Date 05/28/21      PT SHORT TERM GOAL #3   Title Pt will report decreased frequency and intensity of pain with sit<>stand t/fs by at least 50%    Time 4    Period Weeks    Status New    Target Date 05/28/21               PT Long Term Goals - 04/30/21 0911       PT LONG TERM GOAL #1   Title Pt will be independent with advanced HEP    Time 8    Period Weeks    Status New    Target Date 06/25/21      PT LONG TERM GOAL #2   Title Pt 5xSTS will be </=13 sec with no UE support to demo improved functional strength and decreased fall risk    Time 8    Period Weeks    Status New    Target Date 06/25/21      PT LONG TERM GOAL #3   Title Pt will have improved FGA score to at least 26/30 to demo decreased fall risk    Time 8    Period Weeks    Status New    Target Date 06/25/21      PT LONG TERM GOAL #4   Title Pt will  demo at least 4/5 hip abductor and extensor strength for improved trunk/lumbar stability    Time 8    Period Weeks    Status New    Target Date 06/25/21      PT LONG TERM GOAL #5   Title Pt will report no pain with sit<>stand for at least 2 weeks    Time 8    Period Weeks    Status New    Target Date 06/25/21                    Plan - 04/30/21 0902     Clinical  Impression Statement Mr. Derek Hayward" Cox is a 76 y/o M presenting to OPPT due to back pain and gait instability. Pt reports he is currently not experiencing his back pain today; however, last week and since his 2021 lumbar fusion he would get it when he performs sit to stand transfers. Assessment is significant for bilat hip extensor and abductor weakness with R>L LE gross weakness  (likely residual from prior CVA in 2010). Pt appears hypomobile throughout lumbar spine and hips with shortened/tight quads, hip flexors and hamstrings. Pt has had history of recent falls with FGA of 21/30 placing him at high risk of falls. Pt would benefit from PT for gross strengthening and balance exercises to reduce his instances of back pain and improve safe mobility.    Personal Factors and Comorbidities Age;Fitness;Time since onset of injury/illness/exacerbation    Examination-Activity Limitations Locomotion Level;Transfers;Stand;Squat    Examination-Participation Restrictions Community Activity    Stability/Clinical Decision Making Stable/Uncomplicated    Clinical Decision Making Low    Rehab Potential Good    PT Frequency 2x / week    PT Duration 8 weeks    PT Treatment/Interventions ADLs/Self Care Home Management;Aquatic Therapy;Cryotherapy;Electrical Stimulation;Moist Heat;Iontophoresis 4mg /ml Dexamethasone;Stair training;Gait training;Functional mobility training;Therapeutic activities;Therapeutic exercise;Balance training;Neuromuscular re-education;Manual techniques;Patient/family education;Passive range of motion;Dry needling;Taping    PT Next Visit Plan Initiate strengthening and stretching for core, hips and knees. Initiate balance exercises. Provide HEP.    Consulted and Agree with Plan of Care Patient             Patient will benefit from skilled therapeutic intervention in order to improve the following deficits and impairments:  Abnormal gait, Difficulty walking, Increased fascial restricitons, Pain,  Decreased balance, Hypomobility, Impaired flexibility, Improper body mechanics, Decreased mobility, Decreased strength, Postural dysfunction, Decreased safety awareness  Visit Diagnosis: Muscle weakness (generalized)  Abnormal posture  Other abnormalities of gait and mobility     Problem List Patient Active Problem List   Diagnosis Date Noted   Trigeminal neuralgia of left side of face 02/09/2021   Spondylolisthesis of lumbar region 11/06/2019   Pain in right foot 08/14/2018   Erectile dysfunction 09/13/2017   Degeneration of lumbar intervertebral disc 06/22/2017   Low back pain 04/12/2017   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   HTN (hypertension) 12/02/2014   Hyperlipidemia 12/02/2014   Hx of completed stroke 12/02/2014   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/2014   Insomnia 12/02/2014   Hx of seizure disorder 12/02/2014    [facility] April Gordy Levan, PT, DPT 04/30/2021, 9:15 AM  [facility] [address] [place], [place], [address] Phone: [phone]   Fax:  [phone]  Name: Derek Cox MRN: [id number] Date of Birth: 1945-12-23

## 2021-05-02 ENCOUNTER — Encounter: Payer: Self-pay | Admitting: Neurology

## 2021-05-03 ENCOUNTER — Telehealth: Payer: Self-pay | Admitting: Neurology

## 2021-05-03 NOTE — Telephone Encounter (Signed)
Medicare/BCBS supp order sent to [facility], [employer] they will reach out to the patient to schedule.

## 2021-05-10 ENCOUNTER — Encounter: Payer: Self-pay | Admitting: Physical Therapy

## 2021-05-10 ENCOUNTER — Other Ambulatory Visit: Payer: Self-pay

## 2021-05-10 ENCOUNTER — Ambulatory Visit: Payer: Medicare Other | Admitting: Physical Therapy

## 2021-05-10 DIAGNOSIS — M6281 Muscle weakness (generalized): Secondary | ICD-10-CM | POA: Diagnosis not present

## 2021-05-10 DIAGNOSIS — M545 Low back pain, unspecified: Secondary | ICD-10-CM | POA: Diagnosis not present

## 2021-05-10 DIAGNOSIS — R293 Abnormal posture: Secondary | ICD-10-CM

## 2021-05-10 DIAGNOSIS — R2689 Other abnormalities of gait and mobility: Secondary | ICD-10-CM | POA: Diagnosis not present

## 2021-05-10 DIAGNOSIS — G8929 Other chronic pain: Secondary | ICD-10-CM | POA: Diagnosis not present

## 2021-05-10 NOTE — Patient Instructions (Signed)
Access Code: [email or web address] URL: [email or web address] Date: 05/10/2021 Prepared by: Oneita Kras  Exercises Quadruped Alternating Leg Extensions - 1-2 x daily - 5 x weekly - 2 sets - 5 reps - 3 hold

## 2021-05-10 NOTE — Therapy (Signed)
[facility] [address] [place], [place], [address] Phone: [phone]   Fax:  [phone]  Physical Therapy Treatment  Patient Details  Name: Derek Cox MRN: [id number] Date of Birth: 01-Dec-1945 Referring Provider (PT): Melvenia Beam, MD   Encounter Date: 05/10/2021   PT End of Session - 05/10/21 1332     Visit Number 2    Number of Visits 16    Date for PT Re-Evaluation 06/25/21    Authorization Type Medicare    Progress Note Due on Visit 10    PT Start Time 1113    PT Stop Time 1140   Pt arrived late.   PT Time Calculation (min) 27 min    Activity Tolerance Patient tolerated treatment well    Behavior During Therapy WFL for tasks assessed/performed             Past Medical History:  Diagnosis Date   Arthritis    Left hand   BPH with urinary obstruction    sees Dr. 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[place]) 2002   History of dysuria    post surgical   Hyperlipidemia    Hypertension    echo done - 10/10   Inguinal hernia    Seizures ([place])    after stroke , upon self d/c'ing Dilantin, since on Keppra, no repeat of seizure    Sleep apnea    minor sleep apnea - study before 2010, no CPAP   Stroke ([place])    2010, treated at [facility] for evacuation of [facility], then rehabed at [facility], no residual deficits     Past Surgical History:  Procedure Laterality Date   ANTERIOR CERVICAL DECOMP/DISCECTOMY FUSION N/A 11/15/2012   Procedure: ANTERIOR CERVICAL DECOMPRESSION/DISCECTOMY FUSION 1 LEVEL,CERVICAL FOUR-FIVE;  Surgeon: Ophelia Charter, MD;  Location: [facility];  Service: Neurosurgery;  Laterality: N/A;   BRAIN HEMATOMA EVACUATION Left 2010   BRAIN SURGERY     bleeding on brain   CERVICAL FUSION  2007   2 surgeries   COLONOSCOPY  2006   per Dr. Timmothy Euler, clear. Negative Cologuard 04-28-17     CYSTOSCOPY  WITH INSERTION OF UROLIFT N/A 07/25/2016   Procedure: CYSTOSCOPY WITH INSERTION OF UROLIFT x4;  Surgeon: Franchot Gallo, MD;  Location[facility];  Service: Urology;  Laterality: N/A;   HERNIA REPAIR Bilateral    inguinal hernia - repair, [facility]- 20 yrs ago     [facility] evacuation  2010   INGUINAL HERNIA REPAIR  08/11/2011   Procedure: LAPAROSCOPIC INGUINAL HERNIA;  Surgeon: Madilyn Hook, DO;  Location: [facility];  Service: General;  Laterality: Right;   INGUINAL HERNIA REPAIR     KNEE SURGERY     MOLE REMOVAL     non melanoma   PILONIDAL CYST EXCISION     TONSILLECTOMY      There were no vitals filed for this visit.   Subjective Assessment - 05/10/21 1114     Subjective Pt arrived late. No changes from last visit.    Pertinent History PLIF L4-L5 in 2021, CVA 2010, ACDF C4-C5 in 2014, craniotomy s/p CVA in 2010    How long can you sit comfortably? no issues    How long can you stand comfortably? When pain was occurring it was within a few minutes.    How long can you walk comfortably? no  issues    Patient Stated Goals Improve sit to stand without pain    Currently in Pain? No/denies    Pain Onset More than a month ago                               [facility] Adult PT Treatment/Exercise - 05/10/21 0001       Transfers   Transfers Floor to Transfer   worked on transitions from long sit <> side sit <> quadruped<>stand with external support with multiple reps progressing to mod multimodal cues to min cues for sequencing and body mechanics.     Exercises   Exercises Lumbar      Lumbar Exercises: Quadruped   Straight Leg Raise 5 reps;3 seconds   x2 cues for abdominal activation   Straight Leg Raises Limitations difficulty fully extending R LE noted.                     PT Education - 05/10/21 1331     Education Details Floor transfers, HEP for core stability and LE strengthening.    Person(s) Educated Patient;Spouse    Methods Explanation;Handout     Comprehension Verbalized understanding              PT Short Term Goals - 04/30/21 0908       PT SHORT TERM GOAL #1   Title Pt will be independent with initial HEP    Time 4    Period Weeks    Status New    Target Date 05/28/21      PT SHORT TERM GOAL #2   Title Pt will have improved 5x STS to </=15 sec without UE use to demo improving functional LE strength    Baseline 19 sec with use of UEs    Time 4    Period Weeks    Status New    Target Date 05/28/21      PT SHORT TERM GOAL #3   Title Pt will report decreased frequency and intensity of pain with sit<>stand t/fs by at least 50%    Time 4    Period Weeks    Status New    Target Date 05/28/21               PT Long Term Goals - 04/30/21 0911       PT LONG TERM GOAL #1   Title Pt will be independent with advanced HEP    Time 8    Period Weeks    Status New    Target Date 06/25/21      PT LONG TERM GOAL #2   Title Pt 5xSTS will be </=13 sec with no UE support to demo improved functional strength and decreased fall risk    Time 8    Period Weeks    Status New    Target Date 06/25/21      PT LONG TERM GOAL #3   Title Pt will have improved FGA score to at least 26/30 to demo decreased fall risk    Time 8    Period Weeks    Status New    Target Date 06/25/21      PT LONG TERM GOAL #4   Title Pt will demo at least 4/5 hip abductor and extensor strength for improved trunk/lumbar stability    Time 8    Period Weeks    Status New    Target  Date 06/25/21      PT LONG TERM GOAL #5   Title Pt will report no pain with sit<>stand for at least 2 weeks    Time 8    Period Weeks    Status New    Target Date 06/25/21                   Plan - 05/10/21 1333     Clinical Impression Statement Pt reported no pain before or during PT session.  Worked on floor transfers and core stability and hip strengthening in quadruped. Pt progressed with requiring mod cues to min cues for transfer from floor to  mat table at supervision level.    Personal Factors and Comorbidities Age;Fitness;Time since onset of injury/illness/exacerbation    Examination-Activity Limitations Locomotion Level;Transfers;Stand;Squat    Examination-Participation Restrictions Community Activity    Stability/Clinical Decision Making Stable/Uncomplicated    Rehab Potential Good    PT Frequency 2x / week    PT Duration 8 weeks    PT Treatment/Interventions ADLs/Self Care Home Management;Aquatic Therapy;Cryotherapy;Electrical Stimulation;Moist Heat;Iontophoresis 4mg /ml Dexamethasone;Stair training;Gait training;Functional mobility training;Therapeutic activities;Therapeutic exercise;Balance training;Neuromuscular re-education;Manual techniques;Patient/family education;Passive range of motion;Dry needling;Taping    PT Next Visit Plan check and update strengthening and stretching for core, hips and knees. Initiate balance exercises. Provide HEP.    Consulted and Agree with Plan of Care Patient             Patient will benefit from skilled therapeutic intervention in order to improve the following deficits and impairments:  Abnormal gait, Difficulty walking, Increased fascial restricitons, Pain, Decreased balance, Hypomobility, Impaired flexibility, Improper body mechanics, Decreased mobility, Decreased strength, Postural dysfunction, Decreased safety awareness  Visit Diagnosis: Muscle weakness (generalized)  Abnormal posture  Other abnormalities of gait and mobility     Problem List Patient Active Problem List   Diagnosis Date Noted   Trigeminal neuralgia of left side of face 02/09/2021   Spondylolisthesis of lumbar region 11/06/2019   Pain in right foot 08/14/2018   Erectile dysfunction 09/13/2017   Degeneration of lumbar intervertebral disc 06/22/2017   Low back pain 04/12/2017   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   HTN (hypertension) 12/02/2014   Hyperlipidemia 12/02/2014   Hx of completed stroke  12/02/2014   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/2014   Insomnia 12/02/2014   Hx of seizure disorder 12/02/2014    Bjorn Loser, PTA  05/10/21, 1:37 PM   [facility] [address] [place], [place], [address] Phone: [phone]   Fax:  [phone]  Name: Breeze Angell MRN: [id number] Date of Birth: 1945/05/15

## 2021-05-13 ENCOUNTER — Ambulatory Visit: Payer: Medicare Other | Admitting: Physical Therapy

## 2021-05-13 ENCOUNTER — Other Ambulatory Visit: Payer: Self-pay

## 2021-05-13 ENCOUNTER — Encounter: Payer: Self-pay | Admitting: Physical Therapy

## 2021-05-13 DIAGNOSIS — R2689 Other abnormalities of gait and mobility: Secondary | ICD-10-CM | POA: Diagnosis not present

## 2021-05-13 DIAGNOSIS — M545 Low back pain, unspecified: Secondary | ICD-10-CM | POA: Diagnosis not present

## 2021-05-13 DIAGNOSIS — R293 Abnormal posture: Secondary | ICD-10-CM

## 2021-05-13 DIAGNOSIS — M6281 Muscle weakness (generalized): Secondary | ICD-10-CM | POA: Diagnosis not present

## 2021-05-13 DIAGNOSIS — G8929 Other chronic pain: Secondary | ICD-10-CM | POA: Diagnosis not present

## 2021-05-13 NOTE — Therapy (Addendum)
[facility] [address] [place], [place], [address] Phone: [phone]   Fax:  [phone]  Physical Therapy Treatment  Patient Details  Name: Derek Cox MRN: [id number] Date of Birth: 12/11/1945 Referring Provider (PT): Melvenia Beam, MD   Encounter Date: 05/13/2021   PT End of Session - 05/13/21 0854     Visit Number 3    Number of Visits 16    Date for PT Re-Evaluation 06/25/21    Authorization Type Medicare    Progress Note Due on Visit 10    PT Start Time 0848    PT Stop Time 0928    PT Time Calculation (min) 40 min    Activity Tolerance Patient tolerated treatment well    Behavior During Therapy [facility] for tasks assessed/performed             Past Medical History:  Diagnosis Date   Arthritis    Left hand   BPH with urinary obstruction    sees Dr. 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   History of dysuria    post surgical   Hyperlipidemia    Hypertension    echo done - 10/10   Inguinal hernia    Seizures ([place])    after stroke , upon self d/c'ing Dilantin, since on Keppra, no repeat of seizure    Sleep apnea    minor sleep apnea - study before 2010, no CPAP   Stroke ([place])    2010, treated at [facility] for evacuation of [facility], then rehabed at [facility], no residual deficits     Past Surgical History:  Procedure Laterality Date   ANTERIOR CERVICAL DECOMP/DISCECTOMY FUSION N/A 11/15/2012   Procedure: ANTERIOR CERVICAL DECOMPRESSION/DISCECTOMY FUSION 1 LEVEL,CERVICAL FOUR-FIVE;  Surgeon: Ophelia Charter, MD;  [facility];  Service: Neurosurgery;  Laterality: N/A;   BRAIN HEMATOMA EVACUATION Left 2010   BRAIN SURGERY     bleeding on brain   CERVICAL FUSION  2007   2 surgeries   COLONOSCOPY  2006   per Dr. Timmothy Euler, clear. Negative Cologuard 04-28-17     CYSTOSCOPY WITH INSERTION OF  UROLIFT N/A 07/25/2016   Procedure: CYSTOSCOPY WITH INSERTION OF UROLIFT x4;  Surgeon: Franchot Gallo, MD;  Location: [facility];  Service: Urology;  Laterality: N/A;   HERNIA REPAIR Bilateral    inguinal hernia - repair, [facility]- 20 yrs ago     ICH evacuation  2010   INGUINAL HERNIA REPAIR  08/11/2011   Procedure: LAPAROSCOPIC INGUINAL HERNIA;  Surgeon: Madilyn Hook, DO;  Location: [facility];  Service: General;  Laterality: Right;   INGUINAL HERNIA REPAIR     KNEE SURGERY     MOLE REMOVAL     non melanoma   PILONIDAL CYST EXCISION     TONSILLECTOMY      There were no vitals filed for this visit.   Subjective Assessment - 05/13/21 0850     Subjective Tried the quaruped exercise that he was given at last session. Not sure if he is doing it right.    Pertinent History PLIF L4-L5 in 2021, CVA 2010, ACDF C4-C5 in 2014, craniotomy s/p CVA in 2010    How long can you sit comfortably? no issues    How long can you stand comfortably? When pain was occurring it was within a few minutes.  How long can you walk comfortably? no issues    Patient Stated Goals Improve sit to stand without pain    Currently in Pain? Yes    Pain Score 3     Pain Location Back    Pain Orientation Right;Left    Pain Descriptors / Indicators Aching;Sore    Pain Type Chronic pain    Pain Onset More than a month ago    Aggravating Factors  Nothing    Pain Relieving Factors Been without pain since middle of last week.                               [facility] Adult PT Treatment/Exercise - 05/13/21 0914       Transfers   Transfers Floor to Transfer    Floor to Transfer 5: Supervision    Floor to Transfer Details (indicate cue type and reason) Pt needing mod verbal cues to get down to the floor (turning to face mat table and using mat table to lower down to ground). When asked pt to show therapist how he got down to floor, pt stood there and was not sure how to get down on floor without cues. Pt only needing  supervision to perform once getting on and off floor.      Therapeutic Activites    Therapeutic Activities Other Therapeutic Activities    Other Therapeutic Activities Worked on log roll technique to help reduce strain on low back as pt initially getting into supine from long sitting and then leaning back down to pillows. Pt reporting incr pain when performing. Verbal/tactile cues for each step of log roll technique. performed x3 reps with pt needing reminder cues for sequencing/technique for each rep.      Lumbar Exercises: Stretches   Active Hamstring Stretch 2 reps;30 seconds;Limitations    Active Hamstring Stretch Limitations Seated at edge of mat, cued for technique/posture first.              Updated/revised HEP.   Access Code: [email or web address] URL: [email or web address] Date: 05/13/2021 Prepared by: Janann August  Exercises Quadruped Alternating Leg Extensions - 1-2 x daily - 5 x weekly - 2 sets - 5 reps - 3 hold - reviewed this exercise given from last session as pt had a question about the picture and how to perform it. Had sheet in front of pt after having pt getting in quadruped position on floor. Pt unable to sequence exercise and was confused by the picture. Pt needing verbal/tactile and demo cues for technique with pt still having difficulty with processing on how to perform. Discussed will hold off on this exercise for home until pt has more practice in the clinic.  New additions on 05/13/21 Supine Figure 4 Piriformis Stretch - 1-2 x daily - 5 x weekly - 3 sets - 30 hold - cued to gently press with hand on thigh for an additional stretch  Supine Single Knee to Chest Stretch - 1-2 x daily - 5 x weekly - 3 sets - 30 hold Supine Bridge - 2 x daily - 5 x weekly - 1 sets - 10 reps - cues to perform glute set first before lifting, pt lifting within his available range.     PT Education - 05/13/21 0929     Education Details Log roll technique, HEP for hip  stretching/strengthening, taking out quadruped exercise for now as pt needs more practice before having it added for home, having  pt's wife come back to next session for improved carryover with exercises at home.    Person(s) Educated Patient    Methods Explanation;Demonstration;Verbal cues;Handout    Comprehension Verbalized understanding;Returned demonstration;Verbal cues required              PT Short Term Goals - 04/30/21 0908       PT SHORT TERM GOAL #1   Title Pt will be independent with initial HEP    Time 4    Period Weeks    Status New    Target Date 05/28/21      PT SHORT TERM GOAL #2   Title Pt will have improved 5x STS to </=15 sec without UE use to demo improving functional LE strength    Baseline 19 sec with use of UEs    Time 4    Period Weeks    Status New    Target Date 05/28/21      PT SHORT TERM GOAL #3   Title Pt will report decreased frequency and intensity of pain with sit<>stand t/fs by at least 50%    Time 4    Period Weeks    Status New    Target Date 05/28/21               PT Long Term Goals - 04/30/21 0911       PT LONG TERM GOAL #1   Title Pt will be independent with advanced HEP    Time 8    Period Weeks    Status New    Target Date 06/25/21      PT LONG TERM GOAL #2   Title Pt 5xSTS will be </=13 sec with no UE support to demo improved functional strength and decreased fall risk    Time 8    Period Weeks    Status New    Target Date 06/25/21      PT LONG TERM GOAL #3   Title Pt will have improved FGA score to at least 26/30 to demo decreased fall risk    Time 8    Period Weeks    Status New    Target Date 06/25/21      PT LONG TERM GOAL #4   Title Pt will demo at least 4/5 hip abductor and extensor strength for improved trunk/lumbar stability    Time 8    Period Weeks    Status New    Target Date 06/25/21      PT LONG TERM GOAL #5   Title Pt will report no pain with sit<>stand for at least 2 weeks    Time 8     Period Weeks    Status New    Target Date 06/25/21                   Plan - 05/13/21 1243     Clinical Impression Statement Pt needing mod verbal cues to sequence how to safely get to quadruped position on the floor to review exercise given last session. once on the floor, pt with difficulty processing how to perform leg extension exercise and the sequence. Decided to hold off on this one for now until pt gets more practice. Pt also with difficulty processing log roll technique and needing cues. Added other stretches/exercises for pt to perform at home in supine. Discussed having pt's wife come back to future sessions for HEP review for carryover for home.    Personal Factors and Comorbidities Age;Fitness;Time since  onset of injury/illness/exacerbation    Examination-Activity Limitations Locomotion Level;Transfers;Stand;Squat    Examination-Participation Restrictions Community Activity    Stability/Clinical Decision Making Stable/Uncomplicated    Rehab Potential Good    PT Frequency 2x / week    PT Duration 8 weeks    PT Treatment/Interventions ADLs/Self Care Home Management;Aquatic Therapy;Cryotherapy;Electrical Stimulation;Moist Heat;Iontophoresis 4mg /ml Dexamethasone;Stair training;Gait training;Functional mobility training;Therapeutic activities;Therapeutic exercise;Balance training;Neuromuscular re-education;Manual techniques;Patient/family education;Passive range of motion;Dry needling;Taping    PT Next Visit Plan review quadruped hip extension for home before re-issuing (reviewed today and pt was having a lot of difficulty sequencing and processing it). strengthening and stretching for core, hips and knees. Initiate balance exercises. Add to HEP - hip strengthening (try clamshells or standing hip ABD) and then add balance.    Consulted and Agree with Plan of Care Patient             Patient will benefit from skilled therapeutic intervention in order to improve the following  deficits and impairments:  Abnormal gait, Difficulty walking, Increased fascial restricitons, Pain, Decreased balance, Hypomobility, Impaired flexibility, Improper body mechanics, Decreased mobility, Decreased strength, Postural dysfunction, Decreased safety awareness  Visit Diagnosis: Muscle weakness (generalized)  Abnormal posture  Other abnormalities of gait and mobility     Problem List Patient Active Problem List   Diagnosis Date Noted   Trigeminal neuralgia of left side of face 02/09/2021   Spondylolisthesis of lumbar region 11/06/2019   Pain in right foot 08/14/2018   Erectile dysfunction 09/13/2017   Degeneration of lumbar intervertebral disc 06/22/2017   Low back pain 04/12/2017   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   HTN (hypertension) 12/02/2014   Hyperlipidemia 12/02/2014   Hx of completed stroke 12/02/2014   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/2014   Insomnia 12/02/2014   Hx of seizure disorder 12/02/2014    Arliss Journey, PT, DPT  05/13/2021, 12:46 PM  [name] [address] [place], [place], [address] Phone: [phone]   Fax:  [phone]  Name: Derek Cox MRN: [id number] Date of Birth: 05-19-1945

## 2021-05-13 NOTE — Patient Instructions (Signed)
Access Code: [email or web address] URL: [email or web address] Date: 05/13/2021 Prepared by: Janann August  Exercises Quadruped Alternating Leg Extensions - 1-2 x daily - 5 x weekly - 2 sets - 5 reps - 3 hold Supine Figure 4 Piriformis Stretch - 1-2 x daily - 5 x weekly - 3 sets - 30 hold Supine Single Knee to Chest Stretch - 1-2 x daily - 5 x weekly - 3 sets - 30 hold Supine Bridge - 2 x daily - 5 x weekly - 1 sets - 10 reps

## 2021-05-17 ENCOUNTER — Ambulatory Visit: Payer: Medicare Other | Admitting: Physical Therapy

## 2021-05-17 ENCOUNTER — Ambulatory Visit
Admission: RE | Admit: 2021-05-17 | Discharge: 2021-05-17 | Disposition: A | Discharge status: Home / Self Care | Payer: Medicare Other | Source: Ambulatory Visit | Attending: Neurology | Admitting: Neurology

## 2021-05-17 DIAGNOSIS — R270 Ataxia, unspecified: Secondary | ICD-10-CM

## 2021-05-17 DIAGNOSIS — R269 Unspecified abnormalities of gait and mobility: Secondary | ICD-10-CM | POA: Diagnosis not present

## 2021-05-17 DIAGNOSIS — G20C Parkinsonism, unspecified: Secondary | ICD-10-CM

## 2021-05-17 DIAGNOSIS — R251 Tremor, unspecified: Secondary | ICD-10-CM

## 2021-05-17 DIAGNOSIS — G20 Parkinson's disease: Secondary | ICD-10-CM | POA: Diagnosis not present

## 2021-05-17 DIAGNOSIS — R258 Other abnormal involuntary movements: Secondary | ICD-10-CM

## 2021-05-17 DIAGNOSIS — M6289 Other specified disorders of muscle: Secondary | ICD-10-CM

## 2021-05-17 DIAGNOSIS — G1229 Other motor neuron disease: Secondary | ICD-10-CM

## 2021-05-17 DIAGNOSIS — W19XXXA Unspecified fall, initial encounter: Secondary | ICD-10-CM

## 2021-05-17 MED ORDER — GADOBENATE DIMEGLUMINE 529 MG/ML IV SOLN
15.0000 mL | Freq: Once | INTRAVENOUS | Status: AC | PRN
  Administered 2021-05-17: 15 mL via INTRAVENOUS

## 2021-05-19 ENCOUNTER — Other Ambulatory Visit: Payer: Self-pay

## 2021-05-19 ENCOUNTER — Ambulatory Visit: Payer: Medicare Other | Admitting: Physical Therapy

## 2021-05-19 ENCOUNTER — Encounter: Payer: Self-pay | Admitting: Physical Therapy

## 2021-05-19 DIAGNOSIS — R293 Abnormal posture: Secondary | ICD-10-CM | POA: Diagnosis not present

## 2021-05-19 DIAGNOSIS — M6281 Muscle weakness (generalized): Secondary | ICD-10-CM

## 2021-05-19 DIAGNOSIS — M545 Low back pain, unspecified: Secondary | ICD-10-CM | POA: Diagnosis not present

## 2021-05-19 DIAGNOSIS — R2689 Other abnormalities of gait and mobility: Secondary | ICD-10-CM

## 2021-05-19 DIAGNOSIS — G8929 Other chronic pain: Secondary | ICD-10-CM | POA: Diagnosis not present

## 2021-05-19 NOTE — Therapy (Signed)
[facility] [address] [place], [place], [address] Phone: [phone]   Fax:  [phone]  Physical Therapy Treatment  Patient Details  Name: Derek Cox MRN: [id number] Date of Birth: September 18, 1945 Referring Provider (PT): Melvenia Beam, MD   Encounter Date: 05/19/2021   PT End of Session - 05/19/21 1106     Visit Number 4    Number of Visits 16    Date for PT Re-Evaluation 06/25/21    Authorization Type Medicare    Progress Note Due on Visit 10    PT Start Time 1103    PT Stop Time 1144    PT Time Calculation (min) 41 min    Activity Tolerance Patient tolerated treatment well    Behavior During Therapy [facility] for tasks assessed/performed             Past Medical History:  Diagnosis Date   Arthritis    Left hand   BPH with urinary obstruction    sees Dr. 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[place]) 2002   History of dysuria    post surgical   Hyperlipidemia    Hypertension    echo done - 10/10   Inguinal hernia    Seizures ([place])    after stroke , upon self d/c'ing Dilantin, since on Keppra, no repeat of seizure    Sleep apnea    minor sleep apnea - study before 2010, no CPAP   Stroke ([place])    2010, treated at [facility] for evacuation of [facility], then rehabed at [facility], no residual deficits     Past Surgical History:  Procedure Laterality Date   ANTERIOR CERVICAL DECOMP/DISCECTOMY FUSION N/A 11/15/2012   Procedure: ANTERIOR CERVICAL DECOMPRESSION/DISCECTOMY FUSION 1 LEVEL,CERVICAL FOUR-FIVE;  Surgeon: Ophelia Charter, MD;  [facility];  Service: Neurosurgery;  Laterality: N/A;   BRAIN HEMATOMA EVACUATION Left 2010   BRAIN SURGERY     bleeding on brain   CERVICAL FUSION  2007   2 surgeries   COLONOSCOPY  2006   per Dr. Timmothy Euler, clear. Negative Cologuard 04-28-17     CYSTOSCOPY WITH INSERTION OF  UROLIFT N/A 07/25/2016   Procedure: CYSTOSCOPY WITH INSERTION OF UROLIFT x4;  Surgeon: Franchot Gallo, MD;  Location[facility];  Service: Urology;  Laterality: N/A;   HERNIA REPAIR Bilateral    inguinal hernia - repair, [facility]- 20 yrs ago     ICH evacuation  2010   INGUINAL HERNIA REPAIR  08/11/2011   Procedure: LAPAROSCOPIC INGUINAL HERNIA;  Surgeon: Madilyn Hook, DO;  Location: [facility];  Service: General;  Laterality: Right;   INGUINAL HERNIA REPAIR     KNEE SURGERY     MOLE REMOVAL     non melanoma   PILONIDAL CYST EXCISION     TONSILLECTOMY      There were no vitals filed for this visit.   Subjective Assessment - 05/19/21 1107     Subjective Has tried the exercises from last time doing them on the floor. Wife is present during session today.    Pertinent History PLIF L4-L5 in 2021, CVA 2010, ACDF C4-C5 in 2014, craniotomy s/p CVA in 2010    How long can you sit comfortably? no issues    How long can you stand comfortably? When pain was occurring it was within a few minutes.    How  long can you walk comfortably? no issues    Patient Stated Goals Improve sit to stand without pain    Pain Onset More than a month ago                               [facility] Adult PT Treatment/Exercise - 05/19/21 1207       Therapeutic Activites    Therapeutic Activities Other Therapeutic Activities    Other Therapeutic Activities Pt asking about what will be working on in therapy as he is getting frustrated with thinking he has to get on/off floor with all exercises - Reviewed POC and areas to work on in therapy with pt and pt's wife (low back pain, gait balance). Discussed doing all supine exercises on the bed vs the floor (pt gets frustrated trying to get on and off floor) and added standing ones for pt to perform at home.      Exercises   Exercises Other Exercises    Other Exercises  At countertop; attempting standing marching but pt unable to perform with RLE. Modified to straight  leg hip flexion x10 repes each side with pt able to tolerate better. Retro gait down and back x4 reps with cues for step length and hip extensor activation.              Access Code: [email or web address] URL: [email or web address] Date: 05/19/2021 Prepared by: Janann August  Verbally reviewed below exercises with pt performing them at home with spouse giving supervision. Pt to perform all supine exercises in the bed vs. The floor.   Exercises Supine Figure 4 Piriformis Stretch - 1-2 x daily - 5 x weekly - 3 sets - 30 hold Supine Single Knee to Chest Stretch - 1-2 x daily - 5 x weekly - 3 sets - 30 hold Supine Bridge - 2 x daily - 5 x weekly - 1 sets - 10 reps  New additions on 05/19/21 Sit to Stand - 2 x daily - 5 x weekly - 1 sets - 10 reps - pt needing cues to scoot out towards the edge and incr nose over toes. Cued for gently pulling belly button into spine to activate core, performed with no UE support. Pt with no reports of back pain.   Standing Hip Abduction with Counter Support - 1-2 x daily - 5 x weekly - 2 sets - 10 reps - cues for technique  Heel Toe Raises with Counter Support - 1-2 x daily - 5 x weekly - 2 sets - 10 reps        PT Education - 05/19/21 1205     Education Details See therapeutic activity section.    Person(s) Educated Patient;Spouse    Methods Explanation;Demonstration;Handout    Comprehension Verbalized understanding;Returned demonstration              PT Short Term Goals - 04/30/21 0908       PT SHORT TERM GOAL #1   Title Pt will be independent with initial HEP    Time 4    Period Weeks    Status New    Target Date 05/28/21      PT SHORT TERM GOAL #2   Title Pt will have improved 5x STS to </=15 sec without UE use to demo improving functional LE strength    Baseline 19 sec with use of UEs    Time 4    Period Weeks    Status  New    Target Date 05/28/21      PT SHORT TERM GOAL #3   Title Pt will report decreased frequency  and intensity of pain with sit<>stand t/fs by at least 50%    Time 4    Period Weeks    Status New    Target Date 05/28/21               PT Long Term Goals - 04/30/21 0911       PT LONG TERM GOAL #1   Title Pt will be independent with advanced HEP    Time 8    Period Weeks    Status New    Target Date 06/25/21      PT LONG TERM GOAL #2   Title Pt 5xSTS will be </=13 sec with no UE support to demo improved functional strength and decreased fall risk    Time 8    Period Weeks    Status New    Target Date 06/25/21      PT LONG TERM GOAL #3   Title Pt will have improved FGA score to at least 26/30 to demo decreased fall risk    Time 8    Period Weeks    Status New    Target Date 06/25/21      PT LONG TERM GOAL #4   Title Pt will demo at least 4/5 hip abductor and extensor strength for improved trunk/lumbar stability    Time 8    Period Weeks    Status New    Target Date 06/25/21      PT LONG TERM GOAL #5   Title Pt will report no pain with sit<>stand for at least 2 weeks    Time 8    Period Weeks    Status New    Target Date 06/25/21                   Plan - 05/19/21 1214     Clinical Impression Statement Pt's wife present during session today for incr carryover at home. Pt is frustrated with all the exercises he has to do on the floor as he has trouble getting up. Discussed with pt and pt's wife that therapist removed the quadruped exercise from his HEP and exercises last week were given to pt to do on his bed and not the floor. Both verbalized understanding of instructions. Added in strengthening exercises in standing today for HEP with pt tolerating well. Will continue to progress towards LTGs.    Personal Factors and Comorbidities Age;Fitness;Time since onset of injury/illness/exacerbation    Examination-Activity Limitations Locomotion Level;Transfers;Stand;Squat    Examination-Participation Restrictions Community Activity    Stability/Clinical  Decision Making Stable/Uncomplicated    Rehab Potential Good    PT Frequency 2x / week    PT Duration 8 weeks    PT Treatment/Interventions ADLs/Self Care Home Management;Aquatic Therapy;Cryotherapy;Electrical Stimulation;Moist Heat;Iontophoresis 4mg /ml Dexamethasone;Stair training;Gait training;Functional mobility training;Therapeutic activities;Therapeutic exercise;Balance training;Neuromuscular re-education;Manual techniques;Patient/family education;Passive range of motion;Dry needling;Taping    PT Next Visit Plan strengthening and stretching for core,low back, hips and knees. Initiate balance exercises. Add balance to HEP when appropriate.  SciFit would be good for ROM/strengthening.    PT Home Exercise Plan [email or web address]    Consulted and Agree with Plan of Care Patient             Patient will benefit from skilled therapeutic intervention in order to improve the following deficits and impairments:  Abnormal gait, Difficulty  walking, Increased fascial restricitons, Pain, Decreased balance, Hypomobility, Impaired flexibility, Improper body mechanics, Decreased mobility, Decreased strength, Postural dysfunction, Decreased safety awareness  Visit Diagnosis: Muscle weakness (generalized)  Other abnormalities of gait and mobility  Abnormal posture     Problem List Patient Active Problem List   Diagnosis Date Noted   Trigeminal neuralgia of left side of face 02/09/2021   Spondylolisthesis of lumbar region 11/06/2019   Pain in right foot 08/14/2018   Erectile dysfunction 09/13/2017   Degeneration of lumbar intervertebral disc 06/22/2017   Low back pain 04/12/2017   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   HTN (hypertension) 12/02/2014   Hyperlipidemia 12/02/2014   Hx of completed stroke 12/02/2014   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/2014   Insomnia 12/02/2014   Hx of seizure disorder 12/02/2014    Arliss Journey, PT, DPT  05/19/2021, 12:17 PM  [facility] [address] [place], [place], [address] Phone: [phone]   Fax:  [phone]  Name: Derek Cox MRN: [id number] Date of Birth: 28-Jan-1946

## 2021-05-19 NOTE — Patient Instructions (Signed)
Access Code: [email or web address] URL: [email or web address] Date: 05/19/2021 Prepared by: Janann August  Exercises Supine Figure 4 Piriformis Stretch - 1-2 x daily - 5 x weekly - 3 sets - 30 hold Supine Single Knee to Chest Stretch - 1-2 x daily - 5 x weekly - 3 sets - 30 hold Supine Bridge - 2 x daily - 5 x weekly - 1 sets - 10 reps Sit to Stand - 2 x daily - 5 x weekly - 1 sets - 10 reps Standing Hip Abduction with Counter Support - 1-2 x daily - 5 x weekly - 2 sets - 10 reps Heel Toe Raises with Counter Support - 1-2 x daily - 5 x weekly - 2 sets - 10 reps

## 2021-05-20 ENCOUNTER — Telehealth: Payer: Self-pay | Admitting: *Deleted

## 2021-05-20 NOTE — Telephone Encounter (Signed)
I called the pt and left vm on mobile number (ok per dpr) advising of my chart message sent by Dr. Jaynee Eagles. Pt advised to call back if he had questions.   Nothing new seen In the brain. There is evidence of his old stroke in the left frontal lobe otherwise any findings are normal for age. Nothing that would affect his walking or cause falls thanks.  Written by Melvenia Beam, MD on 05/18/2021  3:41 PM EST

## 2021-05-20 NOTE — Telephone Encounter (Signed)
Pt wife called for MRI results please call [phone]

## 2021-05-20 NOTE — Telephone Encounter (Signed)
Patient called regarding his MRI results. Would like to speak with someone his call back number is [phone].

## 2021-05-20 NOTE — Telephone Encounter (Signed)
I called pt again and left a vm about MRI results. If pt calls back phone staff can relay message.   Nothing new seen In the brain. There is evidence of his old stroke in the left frontal lobe otherwise any findings are normal for age. Nothing that would affect his walking or cause falls thanks.

## 2021-05-24 ENCOUNTER — Other Ambulatory Visit: Payer: Self-pay

## 2021-05-24 ENCOUNTER — Encounter: Payer: Self-pay | Admitting: Physical Therapy

## 2021-05-24 ENCOUNTER — Ambulatory Visit: Payer: Medicare Other | Admitting: Physical Therapy

## 2021-05-24 DIAGNOSIS — R2689 Other abnormalities of gait and mobility: Secondary | ICD-10-CM

## 2021-05-24 DIAGNOSIS — G8929 Other chronic pain: Secondary | ICD-10-CM | POA: Diagnosis not present

## 2021-05-24 DIAGNOSIS — R293 Abnormal posture: Secondary | ICD-10-CM

## 2021-05-24 DIAGNOSIS — M6281 Muscle weakness (generalized): Secondary | ICD-10-CM | POA: Diagnosis not present

## 2021-05-24 DIAGNOSIS — M545 Low back pain, unspecified: Secondary | ICD-10-CM | POA: Diagnosis not present

## 2021-05-24 NOTE — Therapy (Signed)
[facility] [address] [place], [place], [address] Phone: [phone]   Fax:  [phone]  Physical Therapy Treatment  Patient Details  Name: Derek Cox MRN: [id number] Date of Birth: 19-Jul-1945 Referring Provider (PT): Melvenia Beam, MD   Encounter Date: 05/24/2021   PT End of Session - 05/24/21 1106     Visit Number 5    Number of Visits 16    Date for PT Re-Evaluation 06/25/21    Authorization Type Medicare    Progress Note Due on Visit 10    PT Start Time 1018    PT Stop Time 1058    PT Time Calculation (min) 40 min    Activity Tolerance Patient tolerated treatment well    Behavior During Therapy [facility] for tasks assessed/performed             Past Medical History:  Diagnosis Date   Arthritis    Left hand   BPH with urinary obstruction    sees Dr. 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   History of dysuria    post surgical   Hyperlipidemia    Hypertension    echo done - 10/10   Inguinal hernia    Seizures ([place])    after stroke , upon self d/c'ing Dilantin, since on Keppra, no repeat of seizure    Sleep apnea    minor sleep apnea - study before 2010, no CPAP   Stroke ([place])    2010, treated at [facility] for evacuation of [facility], then rehabed at [place], no residual deficits     Past Surgical History:  Procedure Laterality Date   ANTERIOR CERVICAL DECOMP/DISCECTOMY FUSION N/A 11/15/2012   Procedure: ANTERIOR CERVICAL DECOMPRESSION/DISCECTOMY FUSION 1 LEVEL,CERVICAL FOUR-FIVE;  Surgeon: Ophelia Charter, MD;  [facility];  Service: Neurosurgery;  Laterality: N/A;   BRAIN HEMATOMA EVACUATION Left 2010   BRAIN SURGERY     bleeding on brain   CERVICAL FUSION  2007   2 surgeries   COLONOSCOPY  2006   per Dr. Timmothy Euler, clear. Negative Cologuard 04-28-17     CYSTOSCOPY WITH INSERTION OF  UROLIFT N/A 07/25/2016   Procedure: CYSTOSCOPY WITH INSERTION OF UROLIFT x4;  Surgeon: Franchot Gallo, MD;  Location: [facility];  Service: Urology;  Laterality: N/A;   HERNIA REPAIR Bilateral    inguinal hernia - repair, [facility]- 20 yrs ago     [facility] evacuation  2010   INGUINAL HERNIA REPAIR  08/11/2011   Procedure: LAPAROSCOPIC INGUINAL HERNIA;  Surgeon: Madilyn Hook, DO;  Location: [facility];  Service: General;  Laterality: Right;   INGUINAL HERNIA REPAIR     KNEE SURGERY     MOLE REMOVAL     non melanoma   PILONIDAL CYST EXCISION     TONSILLECTOMY      There were no vitals filed for this visit.   Subjective Assessment - 05/24/21 1017     Subjective Reports low back pain has gotten better esp. With sit<>stands   Pertinent History PLIF L4-L5 in 2021, CVA 2010, ACDF C4-C5 in 2014, craniotomy s/p CVA in 2010    How long can you sit comfortably? no issues    How long can you stand comfortably? When pain was occurring it was within a few minutes.    How long can you walk comfortably? no issues  Patient Stated Goals Improve sit to stand without pain    Currently in Pain? No/denies    Pain Onset More than a month ago                                   [facility]) - 05/24/21 1043   Standing  PWR! Up 10x2    PWR! [employer] Twisr x10  X10 difficulty with following technique of exercises; multiple step commands   Comments cues for posture and weight shifting technique              Balance Exercises - 05/24/21 0001       Balance Exercises: Standing   Tandem Stance Eyes open;Intermittent upper extremity support    Step Ups 6 inch;Intermittent UE support   x10 with each foot leading.   Tandem Gait Forward;Intermittent upper extremity support    Sidestepping 5 reps                PT Education - 05/24/21 1106     Education Details Edu on how to effectively comply with HEP. Recommend pt perform knee to chest stretch before getting out of bed in the morning  to help reduce pain in low back.   Person(s) Educated Patient    Methods Explanation    Comprehension Verbalized understanding              PT Short Term Goals - 04/30/21 0908       PT SHORT TERM GOAL #1   Title Pt will be independent with initial HEP    Time 4    Period Weeks    Status New    Target Date 05/28/21      PT SHORT TERM GOAL #2   Title Pt will have improved 5x STS to </=15 sec without UE use to demo improving functional LE strength    Baseline 19 sec with use of UEs    Time 4    Period Weeks    Status New    Target Date 05/28/21      PT SHORT TERM GOAL #3   Title Pt will report decreased frequency and intensity of pain with sit<>stand t/fs by at least 50%    Time 4    Period Weeks    Status New    Target Date 05/28/21               PT Long Term Goals - 04/30/21 0911       PT LONG TERM GOAL #1   Title Pt will be independent with advanced HEP    Time 8    Period Weeks    Status New    Target Date 06/25/21      PT LONG TERM GOAL #2   Title Pt 5xSTS will be </=13 sec with no UE support to demo improved functional strength and decreased fall risk    Time 8    Period Weeks    Status New    Target Date 06/25/21      PT LONG TERM GOAL #3   Title Pt will have improved FGA score to at least 26/30 to demo decreased fall risk    Time 8    Period Weeks    Status New    Target Date 06/25/21      PT LONG TERM GOAL #4   Title Pt will demo at least 4/5 hip abductor  and extensor strength for improved trunk/lumbar stability    Time 8    Period Weeks    Status New    Target Date 06/25/21      PT LONG TERM GOAL #5   Title Pt will report no pain with sit<>stand for at least 2 weeks    Time 8    Period Weeks    Status New    Target Date 06/25/21                   Plan - 05/24/21 1107     Clinical Impression Statement Pt continues to require intermittent UE support for dynamic balance with narrow BOS on solid surface.  Worked on Power  type exercise for balance and posture strengthening and awareness; Pt had trouble following directions for > 2 step commands even with multimodal cues/demonstrations.    Personal Factors and Comorbidities Age;Fitness;Time since onset of injury/illness/exacerbation    Examination-Activity Limitations Locomotion Level;Transfers;Stand;Squat    Examination-Participation Restrictions Community Activity    Stability/Clinical Decision Making Stable/Uncomplicated    Rehab Potential Good    PT Frequency 2x / week    PT Duration 8 weeks    PT Treatment/Interventions ADLs/Self Care Home Management;Aquatic Therapy;Cryotherapy;Electrical Stimulation;Moist Heat;Iontophoresis 4mg /ml Dexamethasone;Stair training;Gait training;Functional mobility training;Therapeutic activities;Therapeutic exercise;Balance training;Neuromuscular re-education;Manual techniques;Patient/family education;Passive range of motion;Dry needling;Taping    PT Next Visit Plan strengthening and stretching for core,low back, hips and knees. Initiate balance exercises. Add balance to HEP when appropriate.  SciFit would be good for ROM/strengthening.    PT Home Exercise Plan [email or web address]    Consulted and Agree with Plan of Care Patient             Patient will benefit from skilled therapeutic intervention in order to improve the following deficits and impairments:  Abnormal gait, Difficulty walking, Increased fascial restricitons, Pain, Decreased balance, Hypomobility, Impaired flexibility, Improper body mechanics, Decreased mobility, Decreased strength, Postural dysfunction, Decreased safety awareness  Visit Diagnosis: Muscle weakness (generalized)  Other abnormalities of gait and mobility  Abnormal posture     Problem List Patient Active Problem List   Diagnosis Date Noted   Trigeminal neuralgia of left side of face 02/09/2021   Spondylolisthesis of lumbar region 11/06/2019   Pain in right foot 08/14/2018   Erectile dysfunction  09/13/2017   Degeneration of lumbar intervertebral disc 06/22/2017   Low back pain 04/12/2017   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   HTN (hypertension) 12/02/2014   Hyperlipidemia 12/02/2014   Hx of completed stroke 12/02/2014   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/2014   Insomnia 12/02/2014   Hx of seizure disorder 12/02/2014    Bjorn Loser, PTA  05/24/21, 11:19 AM   [facility] [address] [place], [place], [address] Phone: [phone]   Fax:  [phone]  Name: Rowe Warman MRN: [id number] Date of Birth: [birth year]

## 2021-05-26 ENCOUNTER — Encounter: Payer: Self-pay | Admitting: Physical Therapy

## 2021-05-26 ENCOUNTER — Other Ambulatory Visit: Payer: Self-pay

## 2021-05-26 ENCOUNTER — Ambulatory Visit: Payer: Medicare Other | Attending: Family Medicine | Admitting: Physical Therapy

## 2021-05-26 DIAGNOSIS — R293 Abnormal posture: Secondary | ICD-10-CM | POA: Diagnosis not present

## 2021-05-26 DIAGNOSIS — M6281 Muscle weakness (generalized): Secondary | ICD-10-CM | POA: Insufficient documentation

## 2021-05-26 DIAGNOSIS — R2689 Other abnormalities of gait and mobility: Secondary | ICD-10-CM | POA: Diagnosis not present

## 2021-05-26 NOTE — Patient Instructions (Signed)
Access Code: [email or web address] ?URL: [email or web address] ?Date: 05/26/2021 ?Prepared by: Janann August ? ?Exercises ?Supine Figure 4 Piriformis Stretch - 1-2 x daily - 5 x weekly - 3 sets - 30 hold ?Supine Single Knee to Chest Stretch - 1-2 x daily - 5 x weekly - 3 sets - 30 hold ?Supine Bridge - 2 x daily - 5 x weekly - 1 sets - 10 reps ?Sit to Stand - 2 x daily - 5 x weekly - 1 sets - 10 reps ?Standing Hip Abduction with Counter Support - 1-2 x daily - 5 x weekly - 2 sets - 10 reps ?Heel Toe Raises with Counter Support - 1-2 x daily - 5 x weekly - 2 sets - 10 reps ? ?New addition on 05/26/21 ?Seated Flexion Stretch with Swiss Ball - 1-2 x daily - 5 x weekly - 10 reps - 10 hold ? ?

## 2021-05-26 NOTE — Therapy (Signed)
[facility] [address] [place], [place], [address] Phone: [phone]   Fax:  [phone]  Physical Therapy Treatment  Patient Details  Name: Derek Cox MRN: [id number] Date of Birth: [birth year] Referring Provider (PT): Anson Fret, MD   Encounter Date: 05/26/2021   PT End of Session - 05/26/21 1018     Visit Number 6    Number of Visits 16    Date for PT Re-Evaluation 06/25/21    Authorization Type Medicare    Progress Note Due on Visit 10    PT Start Time 1017    PT Stop Time 1058    PT Time Calculation (min) 41 min    Activity Tolerance Patient tolerated treatment well    Behavior During Therapy [facility] for tasks assessed/performed             Past Medical History:  Diagnosis Date   Arthritis    Left hand   BPH with urinary obstruction    sees Dr. Retta Diones   Cancer [facility])    skin cancers removed NON melanoma   Complication of anesthesia    dysuria   Guillain Barr syndrome ([facility]) 2002   History of dysuria    post surgical   Hyperlipidemia    Hypertension    echo done - 10/10   Inguinal hernia    Seizures ([facility])    after stroke , upon self d/c'ing Dilantin, since on Keppra, no repeat of seizure    Sleep apnea    minor sleep apnea - study before 2010, no CPAP   Stroke ([facility])    2010, treated at [facility] for evacuation of [facility], then rehabed at [facility], no residual deficits     Past Surgical History:  Procedure Laterality Date   ANTERIOR CERVICAL DECOMP/DISCECTOMY FUSION N/A 11/15/2012   Procedure: ANTERIOR CERVICAL DECOMPRESSION/DISCECTOMY FUSION 1 LEVEL,CERVICAL FOUR-FIVE;  Surgeon: Cristi Loron, MD;  [facility];  Service: Neurosurgery;  Laterality: N/A;   BRAIN HEMATOMA EVACUATION Left 2010   BRAIN SURGERY     bleeding on brain   CERVICAL FUSION  2007   2 surgeries   COLONOSCOPY  2006   per Dr. Barnett Abu, clear. Negative Cologuard 04-28-17     CYSTOSCOPY WITH INSERTION OF  UROLIFT N/A 07/25/2016   Procedure: CYSTOSCOPY WITH INSERTION OF UROLIFT x4;  Surgeon: Marcine Matar, MD;  [facility];  Service: Urology;  Laterality: N/A;   HERNIA REPAIR Bilateral    inguinal hernia - repair, [facility]- 20 yrs ago     [facility] evacuation  2010   INGUINAL HERNIA REPAIR  08/11/2011   Procedure: LAPAROSCOPIC INGUINAL HERNIA;  Surgeon: Lodema Pilot, DO;  [facility];  Service: General;  Laterality: Right;   INGUINAL HERNIA REPAIR     KNEE SURGERY     MOLE REMOVAL     non melanoma   PILONIDAL CYST EXCISION     TONSILLECTOMY      There were no vitals filed for this visit.   Subjective Assessment - 05/26/21 1018     Subjective The back is bothering him more today.    Pertinent History PLIF L4-L5 in 2021, CVA 2010, ACDF C4-C5 in 2014, craniotomy s/p CVA in 2010    How long can you sit comfortably? no issues    How long can you stand comfortably? When pain was occurring it was within a few minutes.    How long can you walk comfortably? no issues    Patient  Stated Goals Improve sit to stand without pain    Currently in Pain? Yes    Pain Score 1    Not that painful right now, but pt reports it will start hurting.   Pain Location Back    Pain Orientation Left    Pain Descriptors / Indicators Aching    Pain Type Chronic pain    Pain Onset More than a month ago    Aggravating Factors  Getting up    Pain Relieving Factors Sitting                       [facility] Adult PT Treatment/Exercise - 05/26/21 1026       Transfers   Five time sit to stand comments  15.63 seconds without UE support      Lumbar Exercises: Aerobic   Nustep With BLE/BUE for 6 minutes at gear 4>5 for strengthening, activity tolerance, aerobic warmup.              Lower Extremity Strengthening:   Lateral Step Ups: RLE and LLE, 6", Sets: 1, Reps: 10 each side. Pt needing initial verbal/demo cues for technique and then intermittent multi-modal cues throughout  for proper sequencing.  Needing intermittent UE support when performing with RLE Forward Step Ups: RLE and LLE, 6", Sets: 1, Reps: 10 Multi-modal cues prior to exercise and during exercise for sequencing and proper technique. Pt with tendency to want to use his LLE for each rep, needs tactile cues throughout to use his RLE.    Side stepping down and back on blue balance beam: cued for keeping toes forward for hip ABD down and back x3 reps. Pt reporting incr low back pain after performing this.   X10 reps sit <> stands with no UE support and use of red tband around thighs for hip ABD activation, cues to press out into band throughout exercise.   Ball roll outs using blue physioball for lumbar stretch x8 reps forward with 5-10 second holds each and 2 reps to R/L with holding for a couple of seconds. Pt reports feeling a relief from this stretch. Pt does not have a physioball at home, but showed how to modify using chair in front of pt for pt to perform forward flexion. Pt still reporting feeling good from this stretch. Printed out and gave as HEP.   PT Education - 05/26/21 1214     Education Details New addition to HEP for lumbar stretch.    Person(s) Educated Patient    Methods Explanation;Demonstration;Handout    Comprehension Verbalized understanding;Returned demonstration              PT Short Term Goals - 05/26/21 1025       PT SHORT TERM GOAL #1   Title Pt will be independent with initial HEP    Baseline Pt reports performing his exercises at home.    Time 4    Period Weeks    Status Achieved    Target Date 05/28/21      PT SHORT TERM GOAL #2   Title Pt will have improved 5x STS to </=15 sec without UE use to demo improving functional LE strength    Baseline 19 sec with use of UEs; 15.63 seconds without UE support on 05/26/21    Time 4    Period Weeks    Status Partially Met    Target Date 05/28/21      PT SHORT TERM GOAL #3   Title Pt  will report decreased frequency and intensity of pain with  sit<>stand t/fs by at least 50%    Baseline Pt reports no pain with sit <> stands, reports he can "do these all day"    Time 4    Period Weeks    Status Achieved    Target Date 05/28/21               PT Long Term Goals - 04/30/21 0911       PT LONG TERM GOAL #1   Title Pt will be independent with advanced HEP    Time 8    Period Weeks    Status New    Target Date 06/25/21      PT LONG TERM GOAL #2   Title Pt 5xSTS will be </=13 sec with no UE support to demo improved functional strength and decreased fall risk    Time 8    Period Weeks    Status New    Target Date 06/25/21      PT LONG TERM GOAL #3   Title Pt will have improved FGA score to at least 26/30 to demo decreased fall risk    Time 8    Period Weeks    Status New    Target Date 06/25/21      PT LONG TERM GOAL #4   Title Pt will demo at least 4/5 hip abductor and extensor strength for improved trunk/lumbar stability    Time 8    Period Weeks    Status New    Target Date 06/25/21      PT LONG TERM GOAL #5   Title Pt will report no pain with sit<>stand for at least 2 weeks    Time 8    Period Weeks    Status New    Target Date 06/25/21                   Plan - 05/26/21 1220     Clinical Impression Statement Pt has met 2 of his STGs. Pt partially met STG #2 in regards to sit <> stands. Performed in 15.63 seconds today with no UE support (previously was 19 second with use of BUE). Remainder of session focused on BLE strengthening and lumbar stretches. Pt needing multi-modal cues throughout exercise in order to maintain proper sequencing and technique (esp with step ups). Will continue to progress towards LTGs.    Personal Factors and Comorbidities Age;Fitness;Time since onset of injury/illness/exacerbation    Examination-Activity Limitations Locomotion Level;Transfers;Stand;Squat    Examination-Participation Restrictions Community Activity    Stability/Clinical Decision Making  Stable/Uncomplicated    Rehab Potential Good    PT Frequency 2x / week    PT Duration 8 weeks    PT Treatment/Interventions ADLs/Self Care Home Management;Aquatic Therapy;Cryotherapy;Electrical Stimulation;Moist Heat;Iontophoresis 4mg /ml Dexamethasone;Stair training;Gait training;Functional mobility training;Therapeutic activities;Therapeutic exercise;Balance training;Neuromuscular re-education;Manual techniques;Patient/family education;Passive range of motion;Dry needling;Taping    PT Next Visit Plan how was new low back stretch addition to HEP? strengthening and stretching for core,low back, hips and knees. Initiate balance exercises. Add balance to HEP when appropriate.  SciFit would be good for ROM/strengthening.    PT Home Exercise Plan [email or web address]    Consulted and Agree with Plan of Care Patient             Patient will benefit from skilled therapeutic intervention in order to improve the following deficits and impairments:  Abnormal gait, Difficulty walking, Increased fascial restricitons, Pain, Decreased balance, Hypomobility, Impaired flexibility, Improper  body mechanics, Decreased mobility, Decreased strength, Postural dysfunction, Decreased safety awareness  Visit Diagnosis: Muscle weakness (generalized)  Other abnormalities of gait and mobility  Abnormal posture     Problem List Patient Active Problem List   Diagnosis Date Noted   Trigeminal neuralgia of left side of face 02/09/2021   Spondylolisthesis of lumbar region 11/06/2019   Pain in right foot 08/14/2018   Erectile dysfunction 09/13/2017   Degeneration of lumbar intervertebral disc 06/22/2017   Low back pain 04/12/2017   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   HTN (hypertension) 12/02/2014   Hyperlipidemia 12/02/2014   Hx of completed stroke 12/02/2014   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/2014   Insomnia 12/02/2014   Hx of seizure disorder 12/02/2014    Drake Leach, PT, DPT  05/26/2021, 12:23  PM  [facility] [address] [place], [place], [address] Phone: [phone]   Fax:  [phone]  Name: Coree Hagenow MRN: [id number] Date of Birth: [birth year]

## 2021-05-28 NOTE — Telephone Encounter (Signed)
FYI pt called re: MRI results and message from [place], [place] was relayed.  No call back requested ?

## 2021-05-31 ENCOUNTER — Ambulatory Visit: Payer: Medicare Other | Admitting: Physical Therapy

## 2021-05-31 ENCOUNTER — Other Ambulatory Visit: Payer: Self-pay

## 2021-05-31 ENCOUNTER — Encounter: Payer: Self-pay | Admitting: Physical Therapy

## 2021-05-31 DIAGNOSIS — R293 Abnormal posture: Secondary | ICD-10-CM

## 2021-05-31 DIAGNOSIS — M6281 Muscle weakness (generalized): Secondary | ICD-10-CM

## 2021-05-31 DIAGNOSIS — R2689 Other abnormalities of gait and mobility: Secondary | ICD-10-CM

## 2021-05-31 NOTE — Therapy (Signed)
[facility] [address] [place], [place], [address] Phone: [phone]   Fax:  [phone]  Physical Therapy Treatment  Patient Details  Name: Derek Cox MRN: [id number] Date of Birth: 1945-08-13 Referring Provider (PT): Melvenia Beam, MD   Encounter Date: 05/31/2021   PT End of Session - 05/31/21 1057     Visit Number 7    Number of Visits 16    Date for PT Re-Evaluation 06/25/21    Authorization Type Medicare    Progress Note Due on Visit 10    PT Start Time 1017    PT Stop Time 1058    PT Time Calculation (min) 41 min    Activity Tolerance Patient tolerated treatment well    Behavior During Therapy [facility] for tasks assessed/performed             Past Medical History:  Diagnosis Date   Arthritis    Left hand   BPH with urinary obstruction    sees Dr. 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   History of dysuria    post surgical   Hyperlipidemia    Hypertension    echo done - 10/10   Inguinal hernia    Seizures ([place])    after stroke , upon self d/c'ing Dilantin, since on Keppra, no repeat of seizure    Sleep apnea    minor sleep apnea - study before 2010, no CPAP   Stroke ([place])    2010, treated at [facility] for evacuation of [facility], then rehabed at [facility], no residual deficits     Past Surgical History:  Procedure Laterality Date   ANTERIOR CERVICAL DECOMP/DISCECTOMY FUSION N/A 11/15/2012   Procedure: ANTERIOR CERVICAL DECOMPRESSION/DISCECTOMY FUSION 1 LEVEL,CERVICAL FOUR-FIVE;  Surgeon: Ophelia Charter, MD;  Location: [facility];  Service: Neurosurgery;  Laterality: N/A;   BRAIN HEMATOMA EVACUATION Left 2010   BRAIN SURGERY     bleeding on brain   CERVICAL FUSION  2007   2 surgeries   COLONOSCOPY  2006   per Dr. Timmothy Euler, clear. Negative Cologuard 04-28-17     CYSTOSCOPY WITH INSERTION OF  UROLIFT N/A 07/25/2016   Procedure: CYSTOSCOPY WITH INSERTION OF UROLIFT x4;  Surgeon: Franchot Gallo, MD;  Location: [facility];  Service: Urology;  Laterality: N/A;   HERNIA REPAIR Bilateral    inguinal hernia - repair, [facility]- 20 yrs ago     [facility] evacuation  2010   INGUINAL HERNIA REPAIR  08/11/2011   Procedure: LAPAROSCOPIC INGUINAL HERNIA;  Surgeon: Madilyn Hook, DO;  Location: [facility];  Service: General;  Laterality: Right;   INGUINAL HERNIA REPAIR     KNEE SURGERY     MOLE REMOVAL     non melanoma   PILONIDAL CYST EXCISION     TONSILLECTOMY      There were no vitals filed for this visit.   Subjective Assessment - 05/31/21 1025     Subjective [place]. Pt started without pain in LEs but pain started after 15 min.  Reports no LOB [place].    Pertinent History PLIF L4-L5 in 2021, CVA 2010, ACDF C4-C5 in 2014, craniotomy s/p CVA in 2010    How long can you sit comfortably? no issues    How long can you stand comfortably?  When pain was occurring it was within a few minutes.    How long can you walk comfortably? no issues    Patient Stated Goals Improve sit to stand without pain    Currently in Pain? No/denies    Pain Onset More than a month ago                               [facility] Adult PT Treatment/Exercise - 05/31/21 0001       Lumbar Exercises: Aerobic   Other Aerobic Exercise Sci fit stepper level 3.0 to 5.0, all extremities. Min cues for full ROM             Added 05/31/21 to accompany walking program.  Performed stretch below with cues for technique and posture. Seated Hamstring Stretch - 1-2 x daily - 5 x weekly - 2 sets - 10 reps      Balance Exercises - 05/31/21 0001       Balance Exercises: Standing   Standing, One Foot on a Step Eyes open   Alt toe taps progressing with upright gaze and head turns, cues for posture.               PT Education - 05/31/21 1050      Education Details Reviewed added exercise from last visit and progressed HEP with walking program and seated hamstring stretch.    Person(s) Educated Patient    Methods Explanation;Demonstration;Tactile cues;Verbal cues    Comprehension Verbalized understanding;Verbal cues required;Returned demonstration              PT Short Term Goals - 05/26/21 1025       PT SHORT TERM GOAL #1   Title Pt will be independent with initial HEP    Baseline Pt reports performing his exercises at home.    Time 4    Period Weeks    Status Achieved    Target Date 05/28/21      PT SHORT TERM GOAL #2   Title Pt will have improved 5x STS to </=15 sec without UE use to demo improving functional LE strength    Baseline 19 sec with use of UEs; 15.63 seconds without UE support on 05/26/21    Time 4    Period Weeks    Status Partially Met    Target Date 05/28/21      PT SHORT TERM GOAL #3   Title Pt will report decreased frequency and intensity of pain with sit<>stand t/fs by at least 50%    Baseline Pt reports no pain with sit <> stands, reports he can "do these all day"    Time 4    Period Weeks    Status Achieved    Target Date 05/28/21               PT Long Term Goals - 04/30/21 0911       PT LONG TERM GOAL #1   Title Pt will be independent with advanced HEP    Time 8    Period Weeks    Status New    Target Date 06/25/21      PT LONG TERM GOAL #2   Title Pt 5xSTS will be </=13 sec with no UE support to demo improved functional strength and decreased fall risk    Time 8    Period Weeks    Status New    Target Date 06/25/21  PT LONG TERM GOAL #3   Title Pt will have improved FGA score to at least 26/30 to demo decreased fall risk    Time 8    Period Weeks    Status New    Target Date 06/25/21      PT LONG TERM GOAL #4   Title Pt will demo at least 4/5 hip abductor and extensor strength for improved trunk/lumbar stability    Time 8    Period Weeks    Status New     Target Date 06/25/21      PT LONG TERM GOAL #5   Title Pt will report no pain with sit<>stand for at least 2 weeks    Time 8    Period Weeks    Status New    Target Date 06/25/21                   Plan - 05/31/21 1104     Clinical Impression Statement Pt demonstrates progress with modified SLS (alt toe tapping on block) with greater balance and control and able to perform without back pain.  Progressed HEP with walking    Personal Factors and Comorbidities Age;Fitness;Time since onset of injury/illness/exacerbation    Examination-Activity Limitations Locomotion Level;Transfers;Stand;Squat    Examination-Participation Restrictions Community Activity    Stability/Clinical Decision Making Stable/Uncomplicated    Rehab Potential Good    PT Frequency 2x / week    PT Duration 8 weeks    PT Treatment/Interventions ADLs/Self Care Home Management;Aquatic Therapy;Cryotherapy;Electrical Stimulation;Moist Heat;Iontophoresis 3m/ml Dexamethasone;Stair training;Gait training;Functional mobility training;Therapeutic activities;Therapeutic exercise;Balance training;Neuromuscular re-education;Manual techniques;Patient/family education;Passive range of motion;Dry needling;Taping    PT Next Visit Plan how was new low back stretch addition to HEP? strengthening and stretching for core,low back, hips and knees. Initiate balance exercises. Add balance to HEP when appropriate.  SciFit would be good for ROM/strengthening.    PT Home Exercise Plan [email or web address]    Consulted and Agree with Plan of Care Patient             Patient will benefit from skilled therapeutic intervention in order to improve the following deficits and impairments:  Abnormal gait, Difficulty walking, Increased fascial restricitons, Pain, Decreased balance, Hypomobility, Impaired flexibility, Improper body mechanics, Decreased mobility, Decreased strength, Postural dysfunction, Decreased safety awareness  Visit Diagnosis: Muscle  weakness (generalized)  Other abnormalities of gait and mobility  Abnormal posture     Problem List Patient Active Problem List   Diagnosis Date Noted   Trigeminal neuralgia of left side of face 02/09/2021   Spondylolisthesis of lumbar region 11/06/2019   Pain in right foot 08/14/2018   Erectile dysfunction 09/13/2017   Degeneration of lumbar intervertebral disc 06/22/2017   Low back pain 04/12/2017   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   HTN (hypertension) 12/02/2014   Hyperlipidemia 12/02/2014   Hx of completed stroke 12/02/2014   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/2014   Insomnia 12/02/2014   Hx of seizure disorder 12/02/2014    KBjorn Loser PTA  05/31/21, 1:04 PM   [address] [address]Phone: [phone]  Fax:  [phone] Name: Derek EricssonMRN: [id number] of Birth: [birth year]

## 2021-05-31 NOTE — Patient Instructions (Addendum)
FARM  Walking Program: ? ?Walking for exercise for 7 min out and 7 min return, 1-2 times/day, most days/week.   ?Progress your walking program by adding 1-2 minutes to your routine each week, as tolerated. ?Be sure to wear good walking shoes, walk in a safe environment and only progress to your tolerance.      ? ? ?Access Code: [email or web address] ?URL: [email or web address] ?Date: 05/31/2021 ?Prepared by: Oneita Kras ? ?Exercises ?Supine Figure 4 Piriformis Stretch - 1-2 x daily - 5 x weekly - 3 sets - 30 hold ?Supine Single Knee to Chest Stretch - 1-2 x daily - 5 x weekly - 3 sets - 30 hold ?Supine Bridge - 2 x daily - 5 x weekly - 1 sets - 10 reps ?Sit to Stand - 2 x daily - 5 x weekly - 1 sets - 10 reps ?Standing Hip Abduction with Counter Support - 1-2 x daily - 5 x weekly - 2 sets - 10 reps ?Heel Toe Raises with Counter Support - 1-2 x daily - 5 x weekly - 2 sets - 10 reps ?Seated Flexion Stretch with Swiss Ball - 1-2 x daily - 5 x weekly - 10 reps - 10 hold ? ?Added 05/31/21 to accompany walking program. ?Seated Hamstring Stretch - 1-2 x daily - 5 x weekly - 2 sets - 10 reps ? ?

## 2021-06-02 ENCOUNTER — Ambulatory Visit: Payer: Medicare Other | Admitting: Physical Therapy

## 2021-06-07 ENCOUNTER — Telehealth: Payer: Self-pay | Admitting: Pharmacist

## 2021-06-07 ENCOUNTER — Ambulatory Visit: Payer: Medicare Other | Admitting: Physical Therapy

## 2021-06-07 NOTE — Chronic Care Management (AMB) (Signed)
? ? ?  Chronic Care Management ?Pharmacy Assistant  ? ?Name: Monterrius Cardosa  MRN: [id number] DOB: [birth year] ? ?Reason for Encounter: Disease State General Assessment Call  ?  ?Conditions to be addressed/monitored: ?HTN ? ?Recent office visits:  ?04/12/21 Kellie Simmering, LPN - Patient presented for Medicare Annual Wellness Exam. No medication changes ? ?Recent consult visits:  ?05/31/21 Bjorn Loser, PTA (Physical Therapy) - Patient presented for muscle weakness and other concerns. No medication changes noted. ? ?04/29/21 Melvenia Beam, MD (Neurology) - Patient presented for Gait abnormality and other concerns. Stopped Gabapentin, Methocarbamol & Rosuvastatin. ? ?Hospital visits:  ?None in previous 6 months ? ?Medications: ?Outpatient Encounter Medications as of 06/07/2021  ?Medication Sig  ? ALPRAZolam (XANAX) 1 MG tablet Take 1 tablet (1 mg total) by mouth at bedtime as needed for sleep.  ? levETIRAcetam (KEPPRA) 500 MG tablet TAKE 1 TABLET EVERY 12 HOURS  ? lisinopril (ZESTRIL) 20 MG tablet TAKE 1 TABLET EVERY DAY  ? tamsulosin (FLOMAX) 0.4 MG CAPS capsule Take 1 capsule by mouth daily.  ? ?No facility-administered encounter medications on file as of 06/07/2021.  ?Reviewed chart prior to disease state call. Spoke with patient regarding BP ? ?Recent Office Vitals: ?BP Readings from Last 3 Encounters:  ?04/29/21 123/79  ?02/09/21 140/90  ?09/21/20 116/72  ? ?Pulse Readings from Last 3 Encounters:  ?04/29/21 79  ?02/09/21 74  ?09/21/20 76  ?  ?Wt Readings from Last 3 Encounters:  ?04/29/21 168 lb 3.2 oz (76.3 kg)  ?04/12/21 170 lb (77.1 kg)  ?02/09/21 169 lb (76.7 kg)  ?  ? ?Kidney Function ?Lab Results  ?Component Value Date/Time  ? CREATININE 1.10 04/09/2020 09:59 AM  ? CREATININE 1.29 (H) 11/07/2019 05:07 AM  ? GFR 65.36 04/09/2020 09:59 AM  ? GFRNONAA 54 (L) 11/07/2019 05:07 AM  ? GFRAA >60 11/07/2019 05:07 AM  ? ? ?BMP Latest Ref Rng & Units 04/09/2020 11/07/2019 11/04/2019  ?Glucose 70 - 99 mg/dL 89 141(H) 78   ?BUN 6 - 23 mg/dL '15 15 16  '$ ?Creatinine 0.40 - 1.50 mg/dL 1.10 1.29(H) 1.22  ?Sodium 135 - 145 mEq/L 140 136 141  ?Potassium 3.5 - 5.1 mEq/L 3.9 3.7 4.1  ?Chloride 96 - 112 mEq/L 104 101 103  ?CO2 19 - 32 mEq/L '30 26 28  '$ ?Calcium 8.4 - 10.5 mg/dL 9.3 8.2(L) 9.3  ? ? ?Current antihypertensive regimen:  ?Lisinopril 20 mg 1 tablet daily - PM ?How often are you checking your Blood Pressure? infrequently ?Current home BP readings:  ?BP Readings from Last 3 Encounters:  ?04/29/21 123/79  ?02/09/21 140/90  ?09/21/20 116/72  ?Patient reports he has not been keeping up with his blood pressures lately. He denies any hypo/hypertensive symptoms. ?What recent interventions/DTPs have been made by any provider to improve Blood Pressure control since last CPP Visit: Patient reports none ?Any recent hospitalizations or ED visits since last visit with CPP? No ?Patient reports no concerns or questions at this time. ? ?Adherence Review: ?Is the patient currently on ACE/ARB medication? Yes ?Does the patient have >5 day gap between last estimated fill dates? No ? ? ? ? ?Care Gaps: ?BP- 123/79 ?Hepatitis C Screening - Overdue ?Zoster Vaccine - Overdue ?PNA Vaccine - Overdue ?COVID Booster #4 Therapist, music) - Overdue ?AWV-1/23 ?Per chart notes pt declined future follow ups with Clinical Pharm. ?Star Rating Drugs: ?Lisinopril (Zestril) 20 mg  - Last filled 05/20/21 90 DS at [facility] ? ? ? ?Ned Clines CMA ?Clinical Pharmacist Assistant ?[phone] ? ?

## 2021-06-09 ENCOUNTER — Ambulatory Visit: Payer: Medicare Other | Admitting: Physical Therapy

## 2021-06-14 ENCOUNTER — Ambulatory Visit: Payer: Medicare Other | Admitting: Physical Therapy

## 2021-06-16 ENCOUNTER — Encounter: Payer: Self-pay | Admitting: Physical Therapy

## 2021-06-16 ENCOUNTER — Ambulatory Visit: Payer: Medicare Other | Admitting: Physical Therapy

## 2021-06-16 ENCOUNTER — Telehealth: Payer: Self-pay | Admitting: Physical Therapy

## 2021-06-16 NOTE — Telephone Encounter (Signed)
Called pt regarding no-show appt today and on Monday. Pt reports that he meant to cancel all his appts and is feeling 100% better. Discussed pt would need a new referral if he would need to return in the future. Pt verbalized understanding. ? ?Janann August, PT, DPT ?06/16/21 11:29 AM  ? ? ? ?[facility] [address] ?[place], [place]  [address] ?Phone:  [phone] ?Fax:  [phone] ? ?

## 2021-06-16 NOTE — Therapy (Signed)
[facility] ?[place], [place], [address] ?Phone: [phone]   Fax:  [phone] ? ?Patient Details  ?Name: Derek Cox ?MRN: [id number] ?Date of Birth: January 31, 1946 ?Referring Provider:  No ref. provider found ? ?Encounter Date: 06/16/2021 ? ?PHYSICAL THERAPY DISCHARGE SUMMARY ? ?Visits from Start of Care: 7 ? ?Current functional level related to goals / functional outcomes: ?When STGs were last assessed. Pt did not return for last visit, so unable to check LTGs.  ? PT Short Term Goals - 05/26/21 1025   ? ?  ? PT SHORT TERM GOAL #1  ? Title Pt will be independent with initial HEP   ? Baseline Pt reports performing his exercises at home.   ? Time 4   ? Period Weeks   ? Status Achieved   ? Target Date 05/28/21   ?  ? PT SHORT TERM GOAL #2  ? Title Pt will have improved 5x STS to </=15 sec without UE use to demo improving functional LE strength   ? Baseline 19 sec with use of UEs; 15.63 seconds without UE support on 05/26/21   ? Time 4   ? Period Weeks   ? Status Partially Met   ? Target Date 05/28/21   ?  ? PT SHORT TERM GOAL #3  ? Title Pt will report decreased frequency and intensity of pain with sit<>stand t/fs by at least 50%   ? Baseline Pt reports no pain with sit <> stands, reports he can "do these all day"   ? Time 4   ? Period Weeks   ? Status Achieved   ? Target Date 05/28/21   ? ?  ?  ? ?  ? ? ?  ?Remaining deficits: ?Impaired balance, decr strength. ?  ?Education / Equipment: ?HEP  ? ?Patient agrees to discharge. Patient goals were partially met. Patient is being discharged due to being pleased with the current functional level.Pt reports he is 100% better and wants to cancel all his appts.  ? ? ?Arliss Journey, PT, DPT  ?06/16/2021, 11:31 AM ? ?[facility] [address] ?[place], [place], [address] ?Phone: [phone]   Fax:  [phone] ?

## 2021-06-18 ENCOUNTER — Ambulatory Visit (INDEPENDENT_AMBULATORY_CARE_PROVIDER_SITE_OTHER): Payer: Medicare Other | Admitting: Family Medicine

## 2021-06-18 ENCOUNTER — Encounter: Payer: Self-pay | Admitting: Family Medicine

## 2021-06-18 VITALS — BP 128/82 | HR 74 | Temp 98.6°F | Wt 165.1 lb

## 2021-06-18 DIAGNOSIS — I10 Essential (primary) hypertension: Secondary | ICD-10-CM

## 2021-06-18 NOTE — Progress Notes (Signed)
? ?  Subjective:  ? ? Patient ID: Derek Cox, male    DOB: 1946/01/02, 76 y.o.   MRN: [id number] ? ?HPI ?Here to check his BP. He feels fine but he has been getting some readings of 140/88 at home with his wrist cuff.  ? ? ?Review of Systems  ?Constitutional: Negative.   ?Respiratory: Negative.    ?Cardiovascular: Negative.   ? ?   ?Objective:  ? Physical Exam ?Constitutional:   ?   Appearance: Normal appearance.  ?Cardiovascular:  ?   Rate and Rhythm: Normal rate and regular rhythm.  ?   Pulses: Normal pulses.  ?   Heart sounds: Normal heart sounds.  ?Pulmonary:  ?   Effort: Pulmonary effort is normal.  ?   Breath sounds: Normal breath sounds.  ? ? ? ? ? ?   ?Assessment & Plan:  ?His HTN is actually well controlled. I told him that writ cuffs are notoriously inaccurate, and instead advised him to get an arm cuff. He agreed, and he will follow up as needed.  ?Alysia Penna, MD ? ? ?

## 2021-06-21 ENCOUNTER — Ambulatory Visit: Payer: Medicare Other | Admitting: Physical Therapy

## 2021-06-23 ENCOUNTER — Ambulatory Visit: Payer: Medicare Other | Admitting: Physical Therapy

## 2021-06-28 ENCOUNTER — Ambulatory Visit: Payer: Medicare Other | Admitting: Physical Therapy

## 2021-06-29 ENCOUNTER — Other Ambulatory Visit: Payer: Self-pay | Admitting: Family Medicine

## 2021-06-30 ENCOUNTER — Ambulatory Visit: Payer: Medicare Other | Admitting: Physical Therapy

## 2021-07-13 ENCOUNTER — Telehealth: Payer: Self-pay | Admitting: Neurology

## 2021-07-13 NOTE — Telephone Encounter (Signed)
I called wife.  She relayed that pt has had increase of falls with in the last month.  Out walking in yard, feels like legs collapse (weak).  At one time did have to crawl and yell of help.  He did not hurt himself. More stumbling.  He does not use mobility devices.  I encouraged that.   PT was stopped by pt, wife states that he was not better, and feels he needs that.  Also some issues with memory/confusion.  Referred to pcp to check with underlying infection (for acute UTI) he is not having sx. MRI's brain and cervical nothing that affecting his walking.  Only cbc was done and resulted.   Not sure about other may need to repeat.  She was ok to keep 07-28-2021 AA/MD but wanted to see NP 07-15-21 at 1130.  I told her I would not cancel until he is seen. Spoke about possible other test DATSCAN for gait.  She appreciated call back.   ?  ?Remaining deficits: ?Impaired balance, decr strength. ?  ?Education / Equipment: ?HEP  ?  ?Patient agrees to discharge. Patient goals were partially met. Patient is being discharged due to being pleased with the current functional level.Pt reports he is 100% better and wants to cancel all his appts.  ?  ?  ?Arliss Journey, PT, DPT  ?06/16/2021, 11:31 AM ?  ?[place] ?[place] ?

## 2021-07-13 NOTE — Telephone Encounter (Signed)
Pt's wife, Naseer Hearn (not on DPR), problem with balance, month of April has had 4 falls, was outside had fall laid in the yard for 30 minutes because Ms. Aycock could not hear him. Would like a call from the nurse to discuss pt can be worked in. ?

## 2021-07-15 ENCOUNTER — Ambulatory Visit (INDEPENDENT_AMBULATORY_CARE_PROVIDER_SITE_OTHER): Payer: Medicare Other | Admitting: Adult Health

## 2021-07-15 ENCOUNTER — Encounter: Payer: Self-pay | Admitting: Adult Health

## 2021-07-15 VITALS — BP 147/96 | HR 69 | Ht 70.0 in | Wt 167.0 lb

## 2021-07-15 DIAGNOSIS — R296 Repeated falls: Secondary | ICD-10-CM

## 2021-07-15 DIAGNOSIS — Z8673 Personal history of transient ischemic attack (TIA), and cerebral infarction without residual deficits: Secondary | ICD-10-CM | POA: Diagnosis not present

## 2021-07-15 DIAGNOSIS — R269 Unspecified abnormalities of gait and mobility: Secondary | ICD-10-CM

## 2021-07-15 NOTE — Patient Instructions (Signed)
Your Plan: ? ?Carotid ultrasound ordered ? ? ? ? ? ?Thank you for coming to see [place] at [facility]. I hope we have been able to provide you high quality care today. ? ?You may receive a patient satisfaction survey over the next few weeks. We would appreciate your feedback and comments so that we may continue to improve ourselves and the health of our patients. ? ?

## 2021-07-15 NOTE — Progress Notes (Signed)
? ? ?PATIENT: Derek Cox ?DOB: 05-02-1945 ? ?REASON FOR VISIT: follow up ?HISTORY FROM: patient ?PRIMARY NEUROLOGIST: Dr. Jaynee Eagles  ? ?Chief Complaint  ?Patient presents with  ? RM 18  ?  Here with wife. He has had some recent falls. His R hand swelled after a fall, didn't get checked out. Has had some scuffs, bruises. Has not hit his head. He has knowledge that he is in a fall and denies any LOC. His wife feels he is losing his balance. He has had a few back surgeries and has continued back pain, history of hemorrhagic stroke in 2010.   ? ? ? ?HISTORY OF PRESENT ILLNESS: ?Today 07/15/21: ?Derek Cox is a 76 year old male with a history of trigeminal neuralgia and abnormality of gait with frequent falls.  He returns today for follow-up.  He specifically made this appointment because he was having more trouble with his gait and increased falls.Patient reports that overall he just feels "terrible." Reports that he has had a lot of low back pain. Had three falls this month. Reports that he knows he is losing his balance but can't stop the fall. Was doing PT but stopped it as he didn't think it was helping. However he was not doing the exercising at home.  ? ?Wife reports that when he has low back pain then he gait is worse. Dr. Arnoldo Morale has completed several surgeries on the patient. Had injections with Dr. Nelva Bush but it didn't work ? ?Wife concerned about stenosis in carotid arteries.  Previous history of stroke.  Asking if we can complete carotid ultrasound ? ?HISTORY (copied from Dr. Cathren Laine note) ? Derek Cox is a 76 y.o. male here as requested by [name], MD for Trigeminal neuralgia(he gives a different history and location more occipital) started on gabapentin from his primary care.   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   ?  ?His wife is here and provides much information, the pain starts in the back of the left ear brief and shoots up, severe like a lightning. He has had 2 neck surgeries and 3 low back surgeries. He has had shots with Dr. Nelva Bush in the past. He started getting pain the back of the head on the left encompassing the whole ear. It has tapered off. He is better as far as occipital neuralgia. He doesn't take the gabapentin, it happened last a couple days ago, severe, brief, are radiates to the left side of the head. Not into the temple or around the eye. Verified with wife and patient not into the temple. No vision changes. He is a patient of Dr. Arnoldo Morale, he does not want to return to Dr. Nelva Bush, I encouraged him to talk to Dr. Arnoldo Morale about further pain management in their office they can perform injections and have a physician who does this. The headache pain is new. No tremor at rest. He has some tremor with action.He has been falling, fallen 3 times in the last week, his handwriting. Slow walking has become worse over the years, not as a result of his stroke. Patient says his memory is not impaired. Wife also says his memory is not impaired past what would be expected for age. Also naps many days, falls asleep sititn gup, no snoring, very fatigued. No other focal neurologic deficits, associated symptoms, inciting events or modifiable  factors. ?  ?  ?Reviewed notes, labs and imaging from outside physicians, which showed: ?  ? I reviewed Dr. Barbie Banner notes, about 3 weeks ago he began to have intermittent sharp electric pains in the left temple area, these last about 10 seconds at a time, no vision changes, neurologic deficits.  He was started on gabapentin. ?  ? ?REVIEW OF SYSTEMS: Out of a complete 14 system review of symptoms, the patient complains only of the following symptoms, and all other reviewed systems are negative. ? ?ALLERGIES: ?Allergies  ?Allergen Reactions  ? Dilantin [Phenytoin] Other (See  Comments)  ?  "felt weird"  ? ? ?HOME MEDICATIONS: ?Outpatient Medications Prior to Visit  ?Medication Sig Dispense Refill  ? ALPRAZolam (XANAX) 1 MG tablet TAKE 1 TABLET (1 MG TOTAL) BY MOUTH AT BEDTIME AS NEEDED FOR SLEEP. 30 tablet 5  ? levETIRAcetam (KEPPRA) 500 MG tablet TAKE 1 TABLET EVERY 12 HOURS 180 tablet 3  ? lisinopril (ZESTRIL) 20 MG tablet TAKE 1 TABLET EVERY DAY 90 tablet 3  ? rosuvastatin (CRESTOR) 5 MG tablet Take 5 mg by mouth daily.    ? tamsulosin (FLOMAX) 0.4 MG CAPS capsule Take 1 capsule by mouth daily. 90 capsule 1  ? ?No facility-administered medications prior to visit.  ? ? ?PAST MEDICAL HISTORY: ?Past Medical History:  ?Diagnosis Date  ? Arthritis   ? Left hand  ? BPH with urinary obstruction   ? sees Dr. Diona Fanti  ? Cancer [facility])   ? skin cancers removed NON melanoma  ? Complication of anesthesia   ? dysuria  ? Guillain Barr? syndrome ([place]) 2002  ? History of dysuria   ? post surgical  ? Hyperlipidemia   ? Hypertension   ? echo done - 10/10  ? Inguinal hernia   ? Seizures ([place])   ? after stroke , upon self d/c'ing Dilantin, since on Keppra, no repeat of seizure   ? Sleep apnea   ? minor sleep apnea - study before 2010, no CPAP  ? Stroke [facility])   ? 2010, treated at [place] for evacuation of [facility], then rehabed at [place], no residual deficits   ? ? ?PAST SURGICAL HISTORY: ?Past Surgical History:  ?Procedure Laterality Date  ? ANTERIOR CERVICAL DECOMP/DISCECTOMY FUSION N/A 11/15/2012  ? Procedure: ANTERIOR CERVICAL DECOMPRESSION/DISCECTOMY FUSION 1 LEVEL,CERVICAL FOUR-FIVE;  Surgeon: Ophelia Charter, MD;  Location: [facility];  Service: Neurosurgery;  Laterality: N/A;  ? BRAIN HEMATOMA EVACUATION Left 2010  ? BRAIN SURGERY    ? bleeding on brain  ? CERVICAL FUSION  2007  ? 2 surgeries  ? COLONOSCOPY  2006  ? per Dr. Timmothy Euler, clear. Negative Cologuard 04-28-17    ? CYSTOSCOPY WITH INSERTION OF UROLIFT N/A 07/25/2016  ? Procedure: CYSTOSCOPY WITH INSERTION OF UROLIFT x4;  Surgeon: Franchot Gallo,  MD;  Location: [facility];  Service: Urology;  Laterality: N/A;  ? HERNIA REPAIR Bilateral   ? inguinal hernia - repair, [facility]- 20 yrs ago    ? [facility] evacuation  2010  ? INGUINAL HERNIA REPAIR  08/11/2011  ? Procedure: LAPAROSCOPIC INGUINAL HERNIA;  Surgeon: Madilyn Hook, DO;  Location: [facility];  Service: General;  Laterality: Right;  ? INGUINAL HERNIA REPAIR    ? KNEE SURGERY    ? MOLE REMOVAL    ? non melanoma  ? PILONIDAL CYST EXCISION    ? TONSILLECTOMY    ? ? ?FAMILY HISTORY: ?Family History  ?Problem Relation Age of Onset  ? Stroke Father   ?  Lung cancer Father   ? Anesthesia problems Neg Hx   ? Parkinsonism Neg Hx   ? ? ?SOCIAL HISTORY: ?Social History  ? ?Socioeconomic History  ? Marital status: Married  ?  Spouse name: Not on file  ? Number of children: Not on file  ? Years of education: Not on file  ? Highest education level: Not on file  ?Occupational History  ? Not on file  ?Tobacco Use  ? Smoking status: Former  ?  Packs/day: 0.50  ?  Years: 20.00  ?  Pack years: 10.00  ?  Types: Cigarettes  ?  Quit date: 08/08/1996  ?  Years since quitting: 24.9  ? Smokeless tobacco: Never  ?Vaping Use  ? Vaping Use: Never used  ?Substance and Sexual Activity  ? Alcohol use: Yes  ?  Alcohol/week: 14.0 - 21.0 standard drinks  ?  Types: 14 - 21 Glasses of wine per week  ?  Comment: 2-3 glasses of wine each night  ? Drug use: No  ? Sexual activity: Yes  ?Other Topics Concern  ? Not on file  ?Social History Narrative  ? Lives at home with wife  ? Right handed  ? Caffeine: partial cup of coffee in the morning, maybe a glass of tea at night   ? ?Social Determinants of Health  ? ?Financial Resource Strain: Low Risk   ? Difficulty of Paying Living Expenses: Not hard at all  ?Food Insecurity: No Food Insecurity  ? Worried About Charity fundraiser in the Last Year: Never true  ? Ran Out of Food in the Last Year: Never true  ?Transportation Needs: No Transportation Needs  ? Lack of Transportation (Medical): No  ? Lack of Transportation  (Non-Medical): No  ?Physical Activity: Inactive  ? Days of Exercise per Week: 0 days  ? Minutes of Exercise per Session: 0 min  ?Stress: No Stress Concern Present  ? Feeling of Stress : Not at all  ?Social Con

## 2021-07-28 ENCOUNTER — Ambulatory Visit (INDEPENDENT_AMBULATORY_CARE_PROVIDER_SITE_OTHER): Payer: Medicare Other | Admitting: Neurology

## 2021-07-28 ENCOUNTER — Encounter: Payer: Self-pay | Admitting: Neurology

## 2021-07-28 VITALS — BP 158/94 | HR 70 | Ht 70.0 in | Wt 166.4 lb

## 2021-07-28 DIAGNOSIS — G20 Parkinson's disease: Secondary | ICD-10-CM | POA: Diagnosis not present

## 2021-07-28 DIAGNOSIS — R269 Unspecified abnormalities of gait and mobility: Secondary | ICD-10-CM

## 2021-07-28 DIAGNOSIS — M545 Low back pain, unspecified: Secondary | ICD-10-CM

## 2021-07-28 DIAGNOSIS — G8929 Other chronic pain: Secondary | ICD-10-CM | POA: Diagnosis not present

## 2021-07-28 DIAGNOSIS — W19XXXD Unspecified fall, subsequent encounter: Secondary | ICD-10-CM

## 2021-07-28 DIAGNOSIS — Z8673 Personal history of transient ischemic attack (TIA), and cerebral infarction without residual deficits: Secondary | ICD-10-CM

## 2021-07-28 DIAGNOSIS — R413 Other amnesia: Secondary | ICD-10-CM | POA: Diagnosis not present

## 2021-07-28 DIAGNOSIS — M542 Cervicalgia: Secondary | ICD-10-CM | POA: Diagnosis not present

## 2021-07-28 DIAGNOSIS — G20C Parkinsonism, unspecified: Secondary | ICD-10-CM | POA: Insufficient documentation

## 2021-07-28 MED ORDER — CARBIDOPA-LEVODOPA 25-100 MG PO TABS: ORAL_TABLET | ORAL | 2 refills | Status: DC

## 2021-07-28 NOTE — Patient Instructions (Addendum)
Options: ?Start Sinemet(Carbidopa/Levodopa) ?DAT Scan ?Formal memory testing for a baseline  ?Consider 2nd opinion [facility] after workup ? ? ?Brain DaTscan ?How to prepare and what to expect ?What is a brain DaTscan? ?A brain DaTscan is a nuclear medicine scan. It uses radioactive material to diagnose some diseases of the brain, especially those that cause tremor (shakiness). ?DaTscan is a brand name for a drug called ioflupane I-123. A brain DaTscan is a form of radiology, because radiation is used to take pictures of the body. ?This radioactive drug is ordered especially for you. Because of this, we need at least 72 hours? notice if you must cancel or reschedule your scan. ?  ?How does the scan work? ?You will be given a small dose of tracer (radioactive material) through an intravenous (IV) line. This tracer will collect in part of your brain and give off gamma rays. A special camera called a gamma camera will use these rays to produce pictures and measurements of your brain. ?How do I prepare? ?Some drugs will affect the results of your brain DaTscan. You will need to stop taking these drugs before your scan. ?The table on page 2 lists the drugs that need to be stopped, and for how many days before your scan. This list is in alphabetical order by the generic name of the drug. The common brand names are listed beneath the generic name. ?Please confirm these instructions with your doctor who prescribed the drug. ?Drugs to Stop Taking ?Before your scan, stop taking these medicines for the length of time shown: ?Name of Drug Stop Taking  ?Amoxapine 4 days before  ?Benztropine  ?Cogentin 3 days before  ?Bupropion (Aplenzin, Budeprion, Voxra, Wellbutrin, Zyban) 48 hours before  ?Buspirone 15 hours before  ?Citalopram 24 hours before  ?Cocaine 6 hours before  ?Escitalopram 24 hours before  ?Methamphetamine 24 hours before  ?Methylphenidate (Concerta, Metadate, Methylin, Ritalin) 20  hours before  ?Paroxetine 24 hours before  ?Selegilene 48 hours before  ?Sertraline 3 days before  ?If you are breastfeeding, or if there is any chance you are pregnant, please tell the scheduler or technologist (the person who will help you prepare for your scan). ?How is the scan done? ?When you first arrive, we will ask you to drink a small cup of water with potassium iodine in it. This water may have a metallic taste.  ?An hour after you drink the potassium iodine water, the technologist will inject a small amount of tracer into a vein in your arm or hand through your IV.  ?You must stay in the department for 30 minutes after the injection.  ?You will then have a break for 3 hours. It is OK to eat and drink during this break.  ?You must return to the clinic after this 3-hour break to have images of your brain taken.  ?Then, 4 hours after you receive your tracer injection, the technologist will take images of your brain with the gamma camera. You will lie flat on the exam table while these images are being taken.  ?You must not move while the camera is taking pictures. If you move, the pictures will be blurry and may have to be taken again.  ?Taking the images will take 40 to 45 minutes. Your total time in the imaging room will be about 1 hour.  ?You may also have a low-dose CT scan of your brain to help confirm any results. A CT scan is another way to take  images inside your body.  ?It will take about 5? hours from the time you drink the potassium iodine water until the scans are complete. ?What will I feel during the scan? ?The technologist will help make you as comfortable as possible on the exam table for the scan.  ?You may feel some minor discomfort from the IV.  ?Lying still on the exam table may be hard for some patients.  ?The camera will be close to your head. This may make you feel confined or uneasy (claustrophobic). Please tell the doctor who referred you for this scan if you know you are  claustrophobic. ?Are there any side effects from the scan? ?Most of the radioactivity from the tracer will pass out of your body in your urine or stool. The rest simply goes away over time.  ?Bad reactions to this scan are very rare. Fewer than 1% of patients (fewer than 1 out of 100) have a bad reaction. Reactions may include headache, nausea, vertigo (dizziness), or dry mouth. ?How do I get the results? ?When the test is over, the nuclear medicine doctor will review your images, prepare a written report, and talk with your doctor about the results. Your doctor will then talk with you about the results and your treatment options. ?If you needed to stop taking any medicines on the day of your scan, ask your doctor when to start taking them again. ? ?The potentially interfering drugs consist of: amoxapine, amphetamine, benztropine, bupropion, buspirone, citalopram, cocaine, mazindol, methamphetamine, methylphenidate, norephedrine, phentermine, escitalopram,  phenylpropanolamine, selegiline, paroxetine, and sertraline  ? ?Carbidopa; Levodopa Tablets ?What is this medication? ?CARBIDOPA; LEVODOPA (kar bi DOE pa; lee voe DOE pa) treats the symptoms of Parkinson disease. It works by increasing the amount of dopamine in your brain, a substance which helps manage body movements and coordination. This reduces the symptoms of Parkinson, such as body stiffness and tremors. ?This medicine may be used for other purposes; ask your health care provider or pharmacist if you have questions. ?COMMON BRAND NAME(S): Atamet, Dhivy, SINEMET ?What should I tell my care team before I take this medication? ?They need to know if you have any of these conditions: ?Depression or other mental illness ?Diabetes ?Glaucoma ?Heart disease, including history of a heart attack ?History of irregular heartbeat ?Kidney disease ?Liver disease ?Lung or breathing disease, like asthma ?Narcolepsy ?Sleep apnea ?Stomach or intestine problems ?An unusual or  allergic reaction to levodopa, carbidopa, other medications, foods, dyes, or preservatives ?Pregnant or trying to get pregnant ?Breast-feeding ?How should I use this medication? ?Take this medication by mouth with a glass of water. Follow the directions on the prescription label. Take your doses at regular intervals. Do not take your medication more often than directed. Do not stop taking except on the advice of your care team. ?One form of this medication is a breakable tablet. If your care team has prescribed this, this medication comes with INSTRUCTIONS FOR USE. Ask your pharmacist for directions on how to use this medicine. Read the information carefully. Talk to your pharmacist or care team if you have questions. ?Talk to your care team about the use of this medication in children. Special care may be needed. ?Overdosage: If you think you have taken too much of this medicine contact a poison control center or emergency room at once. ?NOTE: This medicine is only for you. Do not share this medicine with others. ?What if I miss a dose? ?If you miss a dose, take it as soon as  you can. If it is almost time for your next dose, take only that dose. Do not take double or extra doses. ?What may interact with this medication? ?Do not take this medication with any of the following: ?MAOIs like Marplan, Nardil, and Parnate ?Reserpine ?Tetrabenazine ?This medication may also interact with the following: ?Alcohol ?Droperidol ?Entacapone ?Iron supplements or multivitamins with iron ?Isoniazid, INH ?Linezolid ?Medications for depression, anxiety, or psychotic disturbances ?Medications for high blood pressure ?Medications for sleep ?Metoclopramide ?Papaverine ?Procarbazine ?Tedizolid ?Rasagiline ?Selegiline ?Tolcapone ?This list may not describe all possible interactions. Give your health care provider a list of all the medicines, herbs, non-prescription drugs, or dietary supplements you use. Also tell them if you smoke, drink  alcohol, or use illegal drugs. Some items may interact with your medicine. ?What should I watch for while using this medication? ?Visit your care team for regular checks on your progress. Tell your care team if your

## 2021-07-28 NOTE — Progress Notes (Signed)
?[facility] ? ? ? ?Provider:  Dr Jaynee Eagles ?Requesting Provider: [name], MD ?Primary Care Provider:  [name], MD ? ?CC:  Parkinsonism ? ?07/28/2021: Here with wife today.  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  MRI of the cervical spine without any high-grade cervical spinal stenosis but prior ACDF.  We discussed his findings on examination which are very parkinsonian, patient appears to be having memory problems although he denies it, he is having more falls. He did go to physical therapy but he stopped going. If he thnks about he can lift his legs up.  Today we had an extended visit talking about further options for evaluation.  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 ? ?Personally reviewed images of the brain and cervical spine and agree with the following: ? ?MRI brain 05/17/2021: IMPRESSION: This MRI of the brain with and without contrast shows the following: ?1.   No acute findings.  Normal enhancement pattern. ?2.   Sequela of remote left frontal hemorrhage and surgery with subsequent encephalomalacia. ?3.   Mild generalized cortical atrophy and mild ventriculomegaly. ?4.   Scattered T2/FLAIR hyperintense foci in the hemispheres consistent with mild chronic microvascular ischemic change. ?MRI C-Spine: 1.   The spinal cord appears normal.   ?2.   Previous fusion from C4-C7.  The C4-C5 fusion has occurred since the 06/10/2012 MRI.  There continues to be moderate foraminal narrowing to the right at C6-C7, unchanged compared to the previous MRI.  No spinal stenosis at these levels. ?3.   At C3-C4, there are new degenerative changes not noted on the 2014 MRI.  There is 2 mm retrolisthesis, mild right facet hypertrophy and ligamenta flava hypertrophy.  This causes mild spinal stenosis and moderate right foraminal narrowing.   There does not appear to be nerve root compression. ? ?Patient complains of symptoms per HPI as well as the following symptoms: falls . Pertinent negatives and positives per HPI. All others negative ? ? ?HPI:  Derek Cox is a 76 y.o. male here as requested by [name], MD for Trigeminal neuralgia(he gives a different history and location more occipital) started on gabapentin from his primary care.   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   ? ?His wife is here and provides much information, the pain starts in the back of the left ear brief and shoots up, severe like a lightning. He has had 2 neck surgeries and 3 low back surgeries. He has had shots with Dr. Nelva Bush in the past. He started getting pain the back of the head on the left encompassing the whole ear. It has tapered off. He is better as far as occipital neuralgia. He doesn't take the gabapentin, it happened last a couple days ago, severe, brief, are radiates to the left side of the head. Not into the temple or around the eye. Verified with wife and patient not into the temple. No vision changes. He is a patient of Dr. Arnoldo Morale, he does not want to return to Dr. Nelva Bush, I encouraged him to talk to Dr. Arnoldo Morale about further pain management in their office they can perform injections and have a physician who does this. The headache pain  is new. No tremor at rest. He has some tremor with action.He has been falling, fallen 3 times in the last week, his handwriting. Slow walking has become worse over the years, not as a result of his stroke. Patient says his memory is not impaired. Wife also says his memory is not impaired past what would be expected for age. Also naps many days, falls asleep sititn gup, no snoring, very fatigued. No other focal neurologic deficits,  associated symptoms, inciting events or modifiable factors. ? ? ?Reviewed notes, labs and imaging from outside physicians, which showed: ? ? I reviewed Dr. Barbie Banner notes, about 3 weeks ago he began to have intermittent sharp electric pains in the left temple area, these last about 10 seconds at a time, no vision changes, neurologic deficits.  He was started on gabapentin. ? ?Review of Systems: ?Patient complains of symptoms per HPI as well as the following symptoms neck and back pain. Pertinent negatives and positives per HPI. All others negative. ? ? ?Social History  ? ?Socioeconomic History  ? Marital status: Married  ?  Spouse name: Not on file  ? Number of children: Not on file  ? Years of education: Not on file  ? Highest education level: Not on file  ?Occupational History  ? Not on file  ?Tobacco Use  ? Smoking status: Former  ?  Packs/day: 0.50  ?  Years: 20.00  ?  Pack years: 10.00  ?  Types: Cigarettes  ?  Quit date: 08/08/1996  ?  Years since quitting: 24.9  ? Smokeless tobacco: Never  ?Vaping Use  ? Vaping Use: Never used  ?Substance and Sexual Activity  ? Alcohol use: Yes  ?  Alcohol/week: 21.0 standard drinks  ?  Types: 21 Glasses of wine per week  ?  Comment: 2-3 glasses of wine each night  ? Drug use: No  ? Sexual activity: Yes  ?Other Topics Concern  ? Not on file  ?Social History Narrative  ? Lives at home with wife  ? Right handed  ? Caffeine: partial cup of coffee in the morning, maybe a glass of tea at night   ? ?Social Determinants of Health  ? ?Financial Resource Strain: Low Risk   ? Difficulty of Paying Living Expenses: Not hard at all  ?Food Insecurity: No Food Insecurity  ? Worried About Charity fundraiser in the Last Year: Never true  ? Ran Out of Food in the Last Year: Never true  ?Transportation Needs: No Transportation Needs  ? Lack of Transportation (Medical): No  ? Lack of Transportation (Non-Medical): No  ?Physical Activity: Inactive  ? Days of Exercise per Week: 0 days  ? Minutes of  Exercise per Session: 0 min  ?Stress: No Stress Concern Present  ? Feeling of Stress : Not at all  ?Social Connections: Not on file  ?Intimate Partner Violence: Not on file  ? ? ?Family History  ?Problem Relation Age of Onset  ? Stroke Father   ? Lung cancer Father   ? Anesthesia problems Neg Hx   ? Parkinsonism Neg Hx   ? ? ?Past Medical History:  ?Diagnosis Date  ? Arthritis   ? Left hand  ? BPH with urinary obstruction   ? sees Dr. Diona Fanti  ? Cancer [facility])   ? skin cancers removed NON melanoma  ? Complication of anesthesia   ? dysuria  ? Guillain Barr? syndrome ([place]) 2002  ? History of dysuria   ? post surgical  ?  Hyperlipidemia   ? Hypertension   ? echo done - 10/10  ? Inguinal hernia   ? Seizures ([place])   ? after stroke , upon self d/c'ing Dilantin, since on Keppra, no repeat of seizure   ? Sleep apnea   ? minor sleep apnea - study before 2010, no CPAP  ? Stroke [facility])   ? 2010, treated at [place] for evacuation of [facility], then rehabed at [place], no residual deficits   ? ? ?Patient Active Problem List  ? Diagnosis Date Noted  ? Parkinsonism ([place]) 07/28/2021  ? Trigeminal neuralgia of left side of face 02/09/2021  ? Spondylolisthesis of lumbar region 11/06/2019  ? Pain in right foot 08/14/2018  ? Erectile dysfunction 09/13/2017  ? Degeneration of lumbar intervertebral disc 06/22/2017  ? Low back pain 04/12/2017  ? 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  ? HTN (hypertension) 12/02/2014  ? Hyperlipidemia 12/02/2014  ? Hx of completed stroke 12/02/2014  ? 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/2014  ? Insomnia 12/02/2014  ? Hx of seizure disorder 12/02/2014  ? ? ?Past Surgical History:  ?Procedure Laterality Date  ? ANTERIOR CERVICAL DECOMP/DISCECTOMY FUSION N/A 11/15/2012  ? Procedure: ANTERIOR CERVICAL DECOMPRESSION/DISCECTOMY FUSION 1 LEVEL,CERVICAL FOUR-FIVE;  Surgeon: Ophelia Charter, MD;  Location: [facility];  Service: Neurosurgery;  Laterality: N/A;  ? BRAIN HEMATOMA EVACUATION Left 2010  ? BRAIN SURGERY    ?  bleeding on brain  ? CERVICAL FUSION  2007  ? 2 surgeries  ? COLONOSCOPY  2006  ? per Dr. Timmothy Euler, clear. Negative Cologuard 04-28-17    ? CYSTOSCOPY WITH INSERTION OF UROLIFT N/A 07/25/2016  ? Procedure: CYSTOSCOPY

## 2021-08-01 ENCOUNTER — Other Ambulatory Visit: Payer: Self-pay | Admitting: Family Medicine

## 2021-08-02 ENCOUNTER — Telehealth: Payer: Self-pay | Admitting: Neurology

## 2021-08-02 DIAGNOSIS — W19XXXA Unspecified fall, initial encounter: Secondary | ICD-10-CM

## 2021-08-02 DIAGNOSIS — R269 Unspecified abnormalities of gait and mobility: Secondary | ICD-10-CM

## 2021-08-02 NOTE — Telephone Encounter (Signed)
Referral for Neuropsychology sent to [facility] [phone]. ?

## 2021-08-02 NOTE — Telephone Encounter (Signed)
DaTscan medicare/BCBS supp no auth req order sent to Nuclear medicine, they will reach out to the pt to schedule  ? ?Carotid no auth req either- order sent to Butch Penny she will reach out to the patient to schedule.  ?

## 2021-08-02 NOTE — Telephone Encounter (Signed)
Butch Penny sent me a message stating: ? ? ?"The order for Derek Cox is entered wrong, it needs to be a vascular order.  [id number] Carotid" ?

## 2021-08-02 NOTE — Addendum Note (Signed)
Addended by: Trudie Buckler on: 08/02/2021 08:54 AM ? ? Modules accepted: Orders ? ?

## 2021-08-02 NOTE — Telephone Encounter (Signed)
Order placed

## 2021-08-02 NOTE — Telephone Encounter (Signed)
I sent a message to Butch Penny she is aware the order has been placed. ?

## 2021-08-05 NOTE — Telephone Encounter (Signed)
For the [employer] with Nuclear medicine informed me he has tried to reach out to the patient to schedule but has had no luck he will try again.  ?

## 2021-08-10 NOTE — Telephone Encounter (Signed)
Derek Cox with Nuclear medicine sent me a message stating the patient does not want to proceed on scheduling the DaTscan at this time.  ?

## 2021-10-04 ENCOUNTER — Telehealth (INDEPENDENT_AMBULATORY_CARE_PROVIDER_SITE_OTHER): Payer: Medicare Other | Admitting: Family Medicine

## 2021-10-04 ENCOUNTER — Telehealth: Payer: Self-pay | Admitting: Family Medicine

## 2021-10-04 ENCOUNTER — Encounter: Payer: Self-pay | Admitting: Family Medicine

## 2021-10-04 VITALS — Temp 96.6°F | Ht 70.0 in

## 2021-10-04 DIAGNOSIS — U071 COVID-19: Secondary | ICD-10-CM

## 2021-10-04 MED ORDER — MOLNUPIRAVIR EUA 200MG CAPSULE: 4.0000 | ORAL_CAPSULE | Freq: Two times a day (BID) | ORAL | 0 refills | Status: AC

## 2021-10-04 NOTE — Progress Notes (Signed)
Virtual Visit via Telephone Note I connected with [name] on 10/04/21 at  4:00 PM EDT by telephone and verified that I am speaking with the correct person using two identifiers.   I discussed the limitations, risks, security and privacy concerns of performing an evaluation and management service by telephone and the availability of in person appointments. I also discussed with the patient that there may be a patient responsible charge related to this service. The patient expressed understanding and agreed to proceed.  Location patient: home Location provider: work office Participants present for the call: patient, wife, and provider Patient did not have a visit in the prior 7 days to address this/these issue(s).  Chief Complaint  Patient presents with   Covid Positive   History of Present Illness: Derek Cox is a 76 y.o.male with hx of hemorrhagic CVA in 2000,seizure disorder, hypertension, hyperlipidemia, and parkinsonism whose wife performed a home COVID-19 testing today and it was positive. He denies any symptoms but his wife has noted fatigue and slightly more confusion than usual for the past couple days, today he seems back to his baseline.  He also has some clear rhinorrhea and body aches. Sleeping most of the day but easily aroused.   Negative for fever, chills, headache, changes in appetite,sore throat, cough, dyspnea, wheezing, abdominal pain, nausea, vomiting, changes in bowel habits, urinary symptoms, or skin rash.  His wife was diagnosed with COVID-19 infection about 6 days ago. She is taking molnupiravir, she wonders if this medication is safe for him.  He has taken Tylenol 500 mg.  Observations/Objective: Patient sounds cheerful and well on the phone. I do not appreciate any SOB,cough,or wheezing. Speech and thought processing are grossly intact. He does not sound lethargic and in general oriented x 3. Patient reported vitals:Temp (!) 96.6 F (35.9 C)    Ht '5\' 10"'$  (1.778 m)   BMI 23.88 kg/m   Assessment and Plan:  1. COVID-19 virus infection We discussed Dx,possible complications and treatment options. He has a mild case with risk for complications. We discussed oral antiviral options and side effects. He ans his wife agree with trying Molnupiravir. Symptomatic treatment with plenty of fluids,rest,tylenol 500 mg 3-4 times per day prn. 5 to 7 days of quarantine. Clearly instructed about warning signs.  - molnupiravir EUA (LAGEVRIO) 200 mg CAPS capsule; Take 4 capsules (800 mg total) by mouth 2 (two) times daily for 5 days.  Dispense: 40 capsule; Refill: 0  Follow Up Instructions:  Return if symptoms worsen or fail to improve.  I did not refer this patient for an OV in the next 24 hours for this/these issue(s).  I discussed the assessment and treatment plan with the patient. The patient was provided an opportunity to ask questions and all were answered. The patient agreed with the plan and demonstrated an understanding of the instructions.   The patient was advised to call back or seek an in-person evaluation if the symptoms worsen or if the condition fails to improve as anticipated.  I provided 12 minutes of non-face-to-face time during this encounter.  Jamita Mckelvin [name], MD

## 2021-10-04 NOTE — Telephone Encounter (Signed)
Patient calling in with respiratory symptoms: Shortness of breath, chest pain, palpitations or other red words send to Triage  Does the patient have a fever over 100, cough, congestion, sore throat, runny nose, lost of taste/smell (please list symptoms that patient has)?runny nose and cough  What date did symptoms start?10-03-2021 (If over 5 days ago, pt may be scheduled for in person visit)  Have you tested for Covid in the last 5 days? Yes   If yes, was it positive '[x]'$  OR negative '[]'$ ? If positive in the last 5 days, please schedule virtual visit now. If negative, schedule for an in person OV with the next available provider if PCP has no openings. Please also let patient know they will be tested again (follow the script below)  "you will have to arrive 65mns prior to your appt time to be Covid tested. Please park in back of office at the cone & call [phone] let the staff know you have arrived. A staff member will meet you at your car to do a rapid covid test. Once the test has resulted you will be notified by phone of your results to determine if appt will remain an in person visit or be converted to a virtual/phone visit. If you arrive less than 369ms before your appt time, your visit will be automatically converted to virtual & any recommended testing will happen AFTER the visit." Pt has virtual with dr joMartinique-12-2021 4 pm  THINGS TO REMEMBER  If no availability for virtual visit in office,  please schedule another [facility]  If no availability at another [facility], please instruct patient that they can schedule an evisit or virtual visit through their mychart account. Visits up to 8pm  patients can be seen in office 5 days after positive COVID test

## 2021-11-12 ENCOUNTER — Other Ambulatory Visit: Payer: Self-pay | Admitting: Family Medicine

## 2021-11-12 ENCOUNTER — Ambulatory Visit (INDEPENDENT_AMBULATORY_CARE_PROVIDER_SITE_OTHER): Payer: Medicare Other

## 2021-11-12 ENCOUNTER — Ambulatory Visit (INDEPENDENT_AMBULATORY_CARE_PROVIDER_SITE_OTHER): Payer: Medicare Other | Admitting: Family Medicine

## 2021-11-12 ENCOUNTER — Encounter: Payer: Self-pay | Admitting: Family Medicine

## 2021-11-12 VITALS — BP 142/90 | HR 83 | Temp 98.6°F | Ht 70.0 in | Wt 160.0 lb

## 2021-11-12 DIAGNOSIS — Z8673 Personal history of transient ischemic attack (TIA), and cerebral infarction without residual deficits: Secondary | ICD-10-CM

## 2021-11-12 DIAGNOSIS — M5136 Other intervertebral disc degeneration, lumbar region: Secondary | ICD-10-CM

## 2021-11-12 DIAGNOSIS — N529 Male erectile dysfunction, unspecified: Secondary | ICD-10-CM

## 2021-11-12 DIAGNOSIS — N138 Other obstructive and reflux uropathy: Secondary | ICD-10-CM | POA: Diagnosis not present

## 2021-11-12 DIAGNOSIS — E782 Mixed hyperlipidemia: Secondary | ICD-10-CM

## 2021-11-12 DIAGNOSIS — R739 Hyperglycemia, unspecified: Secondary | ICD-10-CM

## 2021-11-12 DIAGNOSIS — G20 Parkinson's disease: Secondary | ICD-10-CM

## 2021-11-12 DIAGNOSIS — N401 Enlarged prostate with lower urinary tract symptoms: Secondary | ICD-10-CM

## 2021-11-12 DIAGNOSIS — G4700 Insomnia, unspecified: Secondary | ICD-10-CM

## 2021-11-12 DIAGNOSIS — S63652A Sprain of metacarpophalangeal joint of right middle finger, initial encounter: Secondary | ICD-10-CM

## 2021-11-12 DIAGNOSIS — M51369 Other intervertebral disc degeneration, lumbar region without mention of lumbar back pain or lower extremity pain: Secondary | ICD-10-CM

## 2021-11-12 DIAGNOSIS — Z23 Encounter for immunization: Secondary | ICD-10-CM

## 2021-11-12 DIAGNOSIS — I10 Essential (primary) hypertension: Secondary | ICD-10-CM

## 2021-11-12 DIAGNOSIS — G20C Parkinsonism, unspecified: Secondary | ICD-10-CM

## 2021-11-12 DIAGNOSIS — M79644 Pain in right finger(s): Secondary | ICD-10-CM | POA: Diagnosis not present

## 2021-11-12 LAB — BASIC METABOLIC PANEL
BUN: 18 mg/dL (ref 6–23)
CO2: 28 mEq/L (ref 19–32)
Calcium: 9.3 mg/dL (ref 8.4–10.5)
Chloride: 103 mEq/L (ref 96–112)
Creatinine, Ser: 1.1 mg/dL (ref 0.40–1.50)
GFR: 64.63 mL/min (ref >60.00)
Glucose, Bld: 85 mg/dL (ref 70–99)
Potassium: 4 mEq/L (ref 3.5–5.1)
Sodium: 141 mEq/L (ref 135–145)

## 2021-11-12 LAB — CBC WITH DIFFERENTIAL/PLATELET
Basophils Absolute: 0 10*3/uL (ref 0.0–0.1)
Basophils Relative: 0.4 % (ref 0.0–3.0)
Eosinophils Absolute: 0 10*3/uL (ref 0.0–0.7)
Eosinophils Relative: 0.4 % (ref 0.0–5.0)
HCT: 43.9 % (ref 39.0–52.0)
Hemoglobin: 14.8 g/dL (ref 13.0–17.0)
Lymphocytes Relative: 11.9 % — ABNORMAL LOW (ref 12.0–46.0)
Lymphs Abs: 0.9 10*3/uL (ref 0.7–4.0)
MCHC: 33.6 g/dL (ref 30.0–36.0)
MCV: 93.3 fl (ref 78.0–100.0)
Monocytes Absolute: 0.6 10*3/uL (ref 0.1–1.0)
Monocytes Relative: 8 % (ref 3.0–12.0)
Neutro Abs: 6.3 10*3/uL (ref 1.4–7.7)
Neutrophils Relative %: 79.3 % — ABNORMAL HIGH (ref 43.0–77.0)
Platelets: 215 10*3/uL (ref 150.0–400.0)
RBC: 4.7 Mil/uL (ref 4.22–5.81)
RDW: 12.7 % (ref 11.5–15.5)
WBC: 7.9 10*3/uL (ref 4.0–10.5)

## 2021-11-12 LAB — HEPATIC FUNCTION PANEL
ALT: 17 U/L (ref 0–53)
AST: 20 U/L (ref 0–37)
Albumin: 4.5 g/dL (ref 3.5–5.2)
Alkaline Phosphatase: 52 U/L (ref 39–117)
Bilirubin, Direct: 0.2 mg/dL (ref 0.0–0.3)
Total Bilirubin: 1 mg/dL (ref 0.2–1.2)
Total Protein: 6.5 g/dL (ref 6.0–8.3)

## 2021-11-12 LAB — LIPID PANEL
Cholesterol: 191 mg/dL (ref 0–200)
HDL: 68.2 mg/dL (ref >39.00)
LDL Cholesterol: 106 mg/dL — ABNORMAL HIGH (ref 0–99)
NonHDL: 122.78
Total CHOL/HDL Ratio: 3
Triglycerides: 84 mg/dL (ref 0.0–149.0)
VLDL: 16.8 mg/dL (ref 0.0–40.0)

## 2021-11-12 LAB — HEMOGLOBIN A1C: Hgb A1c MFr Bld: 5.6 % (ref 4.6–6.5)

## 2021-11-12 LAB — TSH: TSH: 1.24 u[IU]/mL (ref 0.35–5.50)

## 2021-11-12 LAB — PSA: PSA: 2.94 ng/mL (ref 0.10–4.00)

## 2021-11-12 MED ORDER — TAMSULOSIN HCL 0.4 MG PO CAPS: 0.8000 mg | ORAL_CAPSULE | Freq: Every day | ORAL | 3 refills | Status: DC

## 2021-11-12 NOTE — Progress Notes (Signed)
Subjective:    Patient ID: Derek Cox, male    DOB: 1945/12/23, 76 y.o.   MRN: [id number]  HPI Here to follow up on issues. He has one concern, and that is an injury to the right middle finger that occurred at home 3 weeks ago. While spreading fertilizer on his lawn he slipped and fell on his hand. He immediately had pain and swelling at the base of the finger. He iced it and applied a splint. Otherwise his BP is stable. His Parkinsonian symptoms are stable. His chronic low back pain bothers him every day but he is able to tolerate this. He recently had an eye exam and was told he was okay.  He also describes having to urinate 2-3 times at night and his stream is slow. No discomfort.   Review of Systems  Constitutional: Negative.   HENT: Negative.    Eyes: Negative.   Respiratory: Negative.    Cardiovascular: Negative.   Gastrointestinal: Negative.   Genitourinary: Negative.   Musculoskeletal:  Positive for arthralgias and back pain.  Skin: Negative.   Neurological: Negative.   Psychiatric/Behavioral:  Positive for sleep disturbance.        Objective:   Physical Exam Constitutional:      General: He is not in acute distress.    Appearance: Normal appearance. He is well-developed. He is not diaphoretic.  HENT:     Head: Normocephalic and atraumatic.     Right Ear: External ear normal.     Left Ear: External ear normal.     Nose: Nose normal.     Mouth/Throat:     Pharynx: No oropharyngeal exudate.  Eyes:     General: No scleral icterus.       Right eye: No discharge.        Left eye: No discharge.     Conjunctiva/sclera: Conjunctivae normal.     Pupils: Pupils are equal, round, and reactive to light.  Neck:     Thyroid: No thyromegaly.     Vascular: No JVD.     Trachea: No tracheal deviation.  Cardiovascular:     Rate and Rhythm: Normal rate and regular rhythm.     Heart sounds: Normal heart sounds. No murmur heard.    No friction rub. No gallop.  Pulmonary:      Effort: Pulmonary effort is normal. No respiratory distress.     Breath sounds: Normal breath sounds. No wheezing or rales.  Chest:     Chest wall: No tenderness.  Abdominal:     General: Bowel sounds are normal. There is no distension.     Palpations: Abdomen is soft. There is no mass.     Tenderness: There is no abdominal tenderness. There is no guarding or rebound.  Genitourinary:    Penis: Normal. No tenderness.      Testes: Normal.     Prostate: Normal.     Rectum: Normal. Guaiac result negative.  Musculoskeletal:        General: Normal range of motion.     Cervical back: Neck supple.     Comments: The right middle finger is tender along the proximal phalanx. The joints are not involved. No swelling. No crepitus. ROM is full   Lymphadenopathy:     Cervical: No cervical adenopathy.  Skin:    General: Skin is warm and dry.     Coloration: Skin is not pale.     Findings: No erythema or rash.  Neurological:  Mental Status: He is alert and oriented to person, place, and time.     Cranial Nerves: No cranial nerve deficit.     Motor: No abnormal muscle tone.     Coordination: Coordination normal.     Deep Tendon Reflexes: Reflexes are normal and symmetric. Reflexes normal.  Psychiatric:        Mood and Affect: Mood normal.        Behavior: Behavior normal.        Thought Content: Thought content normal.        Judgment: Judgment normal.           Assessment & Plan:  He is doing well with issues like HTN, low back pain, and Parkinsonism. For the BPH, we will increase the Tamsulosin to 0.8 mg at bedtime. Get fasting labs to check lipids, etc. For the finger injury, we will get Xrays today. We spent a total of ( 35  ) minutes reviewing records and discussing these issues.  Alysia Penna, MD

## 2021-11-22 ENCOUNTER — Telehealth: Payer: Self-pay | Admitting: Family Medicine

## 2021-11-22 NOTE — Telephone Encounter (Signed)
Pt called to ask if he can get a call back to clarify the correct way to take this medication: tamsulosin (FLOMAX) 0.4 MG CAPS capsule  Pt says that the directions are very confusing:  "Take 2 capsules (0.8 mg total) by mouth daily.  TAKE 1 CAPSULE (0.4 MG TOTAL) BY MOUTH DAILY"

## 2021-11-22 NOTE — Telephone Encounter (Signed)
Last OV on 11/12/21 notes: For the BPH, we will increase the Tamsulosin to 0.8 mg at bedtime.   LVM instruction that pt should be taking 2 capsules for a total of 0.'8mg'$  (did not leave name of med on VM); advised pt to call back if additional questions.

## 2021-12-09 ENCOUNTER — Other Ambulatory Visit: Payer: Self-pay | Admitting: Family Medicine

## 2021-12-15 ENCOUNTER — Ambulatory Visit (INDEPENDENT_AMBULATORY_CARE_PROVIDER_SITE_OTHER): Payer: Medicare Other | Admitting: Family Medicine

## 2021-12-15 ENCOUNTER — Encounter: Payer: Self-pay | Admitting: Family Medicine

## 2021-12-15 VITALS — BP 124/80 | HR 82 | Temp 97.7°F | Wt 165.0 lb

## 2021-12-15 DIAGNOSIS — M79641 Pain in right hand: Secondary | ICD-10-CM | POA: Diagnosis not present

## 2021-12-15 NOTE — Progress Notes (Signed)
   Subjective:    Patient ID: Derek Cox, male    DOB: [birth year], 76 y.o.   MRN: [id number]  HPI Here with continuing pain in the right hand about 6 weeks after a fall. We saw him on 11-12-21 when he complained primarily of pain in the right third finger. Xrays showed no fracture. Since then the finger is feeling much better, but he has developed a sharp pain in the right hand between the 2nd and 3rd MCP joints.    Review of Systems  Constitutional: Negative.   Respiratory: Negative.    Cardiovascular: Negative.   Musculoskeletal:  Positive for arthralgias.       Objective:   Physical Exam Constitutional:      Appearance: Normal appearance.  Cardiovascular:     Rate and Rhythm: Normal rate and regular rhythm.     Pulses: Normal pulses.     Heart sounds: Normal heart sounds.  Pulmonary:     Effort: Pulmonary effort is normal.     Breath sounds: Normal breath sounds.  Musculoskeletal:     Comments: Right hand appears normal. All fingers have full ROM with no tenderness. He is very tender in the space between the 2nd and 3rd MCP joints. No masses are felt.   Neurological:     Mental Status: He is alert.           Assessment & Plan:  Right hand pain. We will refer him to Hand Surgery to evaluate.  Alysia Penna, MD

## 2021-12-22 DIAGNOSIS — M79644 Pain in right finger(s): Secondary | ICD-10-CM | POA: Diagnosis not present

## 2022-01-07 ENCOUNTER — Other Ambulatory Visit: Payer: Self-pay | Admitting: Family Medicine

## 2022-01-07 NOTE — Telephone Encounter (Signed)
Pt LOV was on 12/15/21 Last refill was done on 06/30/2021 Please advise

## 2022-02-08 DIAGNOSIS — L820 Inflamed seborrheic keratosis: Secondary | ICD-10-CM | POA: Diagnosis not present

## 2022-02-08 DIAGNOSIS — H9192 Unspecified hearing loss, left ear: Secondary | ICD-10-CM | POA: Diagnosis not present

## 2022-02-08 DIAGNOSIS — D485 Neoplasm of uncertain behavior of skin: Secondary | ICD-10-CM | POA: Diagnosis not present

## 2022-02-08 DIAGNOSIS — C44329 Squamous cell carcinoma of skin of other parts of face: Secondary | ICD-10-CM | POA: Diagnosis not present

## 2022-02-11 ENCOUNTER — Telehealth: Payer: Self-pay | Admitting: Family Medicine

## 2022-02-11 ENCOUNTER — Emergency Department (HOSPITAL_COMMUNITY)
Admission: EM | Admit: 2022-02-11 | Discharge: 2022-02-12 | Discharge status: Against Medical Advice | Payer: Medicare Other | Attending: Emergency Medicine | Admitting: Emergency Medicine

## 2022-02-11 ENCOUNTER — Other Ambulatory Visit: Payer: Self-pay

## 2022-02-11 ENCOUNTER — Emergency Department (HOSPITAL_COMMUNITY): Payer: Medicare Other

## 2022-02-11 ENCOUNTER — Encounter (HOSPITAL_COMMUNITY): Payer: Self-pay

## 2022-02-11 DIAGNOSIS — I639 Cerebral infarction, unspecified: Secondary | ICD-10-CM | POA: Insufficient documentation

## 2022-02-11 DIAGNOSIS — W19XXXA Unspecified fall, initial encounter: Secondary | ICD-10-CM | POA: Insufficient documentation

## 2022-02-11 DIAGNOSIS — R531 Weakness: Secondary | ICD-10-CM | POA: Diagnosis not present

## 2022-02-11 DIAGNOSIS — I959 Hypotension, unspecified: Secondary | ICD-10-CM | POA: Diagnosis not present

## 2022-02-11 DIAGNOSIS — R748 Abnormal levels of other serum enzymes: Secondary | ICD-10-CM | POA: Diagnosis not present

## 2022-02-11 DIAGNOSIS — Z79899 Other long term (current) drug therapy: Secondary | ICD-10-CM | POA: Diagnosis not present

## 2022-02-11 DIAGNOSIS — M6282 Rhabdomyolysis: Secondary | ICD-10-CM | POA: Diagnosis not present

## 2022-02-11 DIAGNOSIS — R202 Paresthesia of skin: Secondary | ICD-10-CM | POA: Diagnosis not present

## 2022-02-11 DIAGNOSIS — M25552 Pain in left hip: Secondary | ICD-10-CM | POA: Diagnosis not present

## 2022-02-11 LAB — URINALYSIS, ROUTINE W REFLEX MICROSCOPIC
Bacteria, UA: NONE SEEN
Bilirubin Urine: NEGATIVE
Glucose, UA: NEGATIVE mg/dL
Ketones, ur: 5 mg/dL — AB
Leukocytes,Ua: NEGATIVE
Nitrite: NEGATIVE
Protein, ur: NEGATIVE mg/dL
Specific Gravity, Urine: 1.015 (ref 1.005–1.030)
pH: 5 (ref 5.0–8.0)

## 2022-02-11 NOTE — Telephone Encounter (Signed)
Please speak to his wife and see if we can convince him to go to the ED

## 2022-02-11 NOTE — Telephone Encounter (Signed)
Spoke with pt wife advised that Dr Sarajane Jews recommends for him to go to the ED for evaluation, pt wife state that she will call the EMS and convince pt to go.

## 2022-02-11 NOTE — Telephone Encounter (Signed)
Pt wife called to see if Dr. Sarajane Jews can return her call concerning pt. Pt has no strength in his legs and pt wife called EMS however pt refused to go with them. Pt is currently sitting in a chair in the living room and she can't get him to the bathroom since he can barely move his legs. She doesn't know what to do and need some advice.   Please reach out to Mrs. Bloxham at #[phone]  Please advise

## 2022-02-11 NOTE — ED Triage Notes (Addendum)
PER EMS: pt is here after a mechanical fall today. He reports leg weakness after he fell backwards in his yard when doing yard work while he was bent over. He states he is unable to stand on that leg due to the pain in his left leg as well as his back. He denies LOC, head injury or being on blood thinners. A&OX4.   BP- 128/82, HR-94, 98% RA, CBG-102

## 2022-02-11 NOTE — ED Provider Triage Note (Signed)
Emergency Medicine Provider Triage Evaluation Note  Derek Cox , a 76 y.o. male  was evaluated in triage.  Pt complains of left hip pain and difficulty urinating following a fall around 10:30 am.  Was bent over in the garden and became mildly off balance.  Fell softly/rolled onto left side.  Felt pain in left hip afterwards and has had increased difficulty walking since event.  Denies hitting head, LOC, neck pain, shortness of breath, chest pain, abdominal pain, dizziness, numbness/tingling of legs before or after event.  Abnormal gait at baseline following prior stroke in 2010.  Also complex hx of chronic back pain and multiple associated surgeries.  Pt's spouse concerned of possible urinary symptoms as secondary complaint.  Review of Systems  Positive:  Negative: See above  Physical Exam  BP (!) 142/97 (BP Location: Left Arm)   Pulse 96   Temp 98.1 F (36.7 C)   Resp 18   Ht '5\' 10"'$  (1.778 m)   Wt 74.8 kg   SpO2 94%   BMI 23.68 kg/m  Gen:   Awake, no distress   Resp:  Normal effort  MSK:   Moves extremities without difficulty  Other:  Sitting comfortably.  Tenderness of left pelvic girdle/femoral head.  No evidence of head injury.  No saddle anesthesia.  Lower extremities appear neurovascularly intact.  ROM of left hip deferred due to possible injury such as fracture.  Abdomen soft, nontender.  No obvious bony deformity of left lower extremity/pelvis.  Medical Decision Making  Medically screening exam initiated at 6:47 PM.  Appropriate orders placed.  Cortlan Lionell Matuszak was informed that the remainder of the evaluation will be completed by another provider, this initial triage assessment does not replace that evaluation, and the importance of remaining in the ED until their evaluation is complete.  UA and imaging ordered   Prince Rome, Hershal Coria 38/18/29 1905

## 2022-02-12 ENCOUNTER — Emergency Department (HOSPITAL_COMMUNITY): Payer: Medicare Other

## 2022-02-12 DIAGNOSIS — M6282 Rhabdomyolysis: Secondary | ICD-10-CM

## 2022-02-12 DIAGNOSIS — R531 Weakness: Secondary | ICD-10-CM | POA: Diagnosis not present

## 2022-02-12 DIAGNOSIS — I639 Cerebral infarction, unspecified: Secondary | ICD-10-CM | POA: Diagnosis not present

## 2022-02-12 LAB — APTT: aPTT: 28 seconds (ref 24–36)

## 2022-02-12 LAB — DIFFERENTIAL
Abs Immature Granulocytes: 0.05 10*3/uL (ref 0.00–0.07)
Basophils Absolute: 0 10*3/uL (ref 0.0–0.1)
Basophils Relative: 0 %
Eosinophils Absolute: 0.1 10*3/uL (ref 0.0–0.5)
Eosinophils Relative: 1 %
Immature Granulocytes: 0 %
Lymphocytes Relative: 14 %
Lymphs Abs: 1.6 10*3/uL (ref 0.7–4.0)
Monocytes Absolute: 1.2 10*3/uL — ABNORMAL HIGH (ref 0.1–1.0)
Monocytes Relative: 11 %
Neutro Abs: 8.4 10*3/uL — ABNORMAL HIGH (ref 1.7–7.7)
Neutrophils Relative %: 74 %

## 2022-02-12 LAB — I-STAT CHEM 8, ED
BUN: 20 mg/dL (ref 8–23)
Calcium, Ion: 1.23 mmol/L (ref 1.15–1.40)
Chloride: 106 mmol/L (ref 98–111)
Creatinine, Ser: 1.6 mg/dL — ABNORMAL HIGH (ref 0.61–1.24)
Glucose, Bld: 87 mg/dL (ref 70–99)
HCT: 39 % (ref 39.0–52.0)
Hemoglobin: 13.3 g/dL (ref 13.0–17.0)
Potassium: 3.6 mmol/L (ref 3.5–5.1)
Sodium: 140 mmol/L (ref 135–145)
TCO2: 24 mmol/L (ref 22–32)

## 2022-02-12 LAB — COMPREHENSIVE METABOLIC PANEL
ALT: 26 U/L (ref 0–44)
AST: 85 U/L — ABNORMAL HIGH (ref 15–41)
Albumin: 3.8 g/dL (ref 3.5–5.0)
Alkaline Phosphatase: 43 U/L (ref 38–126)
Anion gap: 10 (ref 5–15)
BUN: 20 mg/dL (ref 8–23)
CO2: 24 mmol/L (ref 22–32)
Calcium: 9.4 mg/dL (ref 8.9–10.3)
Chloride: 107 mmol/L (ref 98–111)
Creatinine, Ser: 1.51 mg/dL — ABNORMAL HIGH (ref 0.61–1.24)
GFR, Estimated: 47 mL/min — ABNORMAL LOW (ref >60)
Glucose, Bld: 94 mg/dL (ref 70–99)
Potassium: 3.5 mmol/L (ref 3.5–5.1)
Sodium: 141 mmol/L (ref 135–145)
Total Bilirubin: 0.8 mg/dL (ref 0.3–1.2)
Total Protein: 5.9 g/dL — ABNORMAL LOW (ref 6.5–8.1)

## 2022-02-12 LAB — CBC
HCT: 41.4 % (ref 39.0–52.0)
Hemoglobin: 13.8 g/dL (ref 13.0–17.0)
MCH: 31.2 pg (ref 26.0–34.0)
MCHC: 33.3 g/dL (ref 30.0–36.0)
MCV: 93.5 fL (ref 80.0–100.0)
Platelets: 186 10*3/uL (ref 150–400)
RBC: 4.43 MIL/uL (ref 4.22–5.81)
RDW: 12.3 % (ref 11.5–15.5)
WBC: 11.4 10*3/uL — ABNORMAL HIGH (ref 4.0–10.5)
nRBC: 0 % (ref 0.0–0.2)

## 2022-02-12 LAB — HEMOGLOBIN A1C
Hgb A1c MFr Bld: 5.2 % (ref 4.8–5.6)
Mean Plasma Glucose: 102.54 mg/dL

## 2022-02-12 LAB — MAGNESIUM: Magnesium: 2.5 mg/dL — ABNORMAL HIGH (ref 1.7–2.4)

## 2022-02-12 LAB — PROTIME-INR
INR: 1.1 (ref 0.8–1.2)
Prothrombin Time: 14 seconds (ref 11.4–15.2)

## 2022-02-12 LAB — ETHANOL: Alcohol, Ethyl (B): <10 mg/dL (ref <10)

## 2022-02-12 LAB — LIPID PANEL
Cholesterol: 145 mg/dL (ref 0–200)
HDL: 67 mg/dL (ref >40)
LDL Cholesterol: 69 mg/dL (ref 0–99)
Total CHOL/HDL Ratio: 2.2 RATIO
Triglycerides: 46 mg/dL (ref <150)
VLDL: 9 mg/dL (ref 0–40)

## 2022-02-12 LAB — CK: Total CK: 5116 U/L — ABNORMAL HIGH (ref 49–397)

## 2022-02-12 MED ORDER — LEVETIRACETAM IN NACL 500 MG/100ML IV SOLN
500.0000 mg | Freq: Once | INTRAVENOUS | Status: DC
  Filled 2022-02-12: qty 100

## 2022-02-12 MED ORDER — LEVETIRACETAM 500 MG PO TABS: 750.0000 mg | ORAL_TABLET | Freq: Two times a day (BID) | ORAL | Status: DC

## 2022-02-12 MED ORDER — LACTATED RINGERS IV BOLUS: 1000.0000 mL | Freq: Once | INTRAVENOUS | Status: DC

## 2022-02-12 MED ORDER — STROKE: EARLY STAGES OF RECOVERY BOOK: Freq: Once | Status: DC

## 2022-02-12 MED ORDER — LEVETIRACETAM 750 MG PO TABS: 750.0000 mg | ORAL_TABLET | Freq: Two times a day (BID) | ORAL | 0 refills | Status: DC

## 2022-02-12 NOTE — ED Notes (Signed)
Per provider pt has refused to take IV medication that was ordered prior to him going to MRI. Was advised that the pt was going to be AMA d/c

## 2022-02-12 NOTE — ED Notes (Signed)
Pt transported to MRI at this time 

## 2022-02-12 NOTE — Discharge Instructions (Addendum)
You need to drink plenty of fluid today.  Please have your doctor recheck your CK level.  Return if you have dark urine, muscle aches, or return of your weakness.

## 2022-02-12 NOTE — ED Provider Notes (Signed)
[facility] Emergency Department Provider Note MRN:  [id number]  Arrival date & time: 02/12/22     Chief Complaint   Fall   History of Present Illness   Derek Cox is a 76 y.o. year-old male presents to the ED with chief complaint of right lower extremity weakness.  He states that he had a fall earlier today.  He was unable to get up.  EMS was called after the wife found him down in the yard.  They assisted him to his bed.  Transport to the hospital was refused.  He states that his leg felt numb and dead all day long.  He talked with his primary care doctor, who advised him to come to the hospital.  He called EMS and was transported in the evening.  While patient was waiting for a treatment room, he was able to ambulate to the bathroom.  His wife states that this is the first time he was able to move his leg today.  Patient initially told me right leg was the affected leg, but then said it was the left.  History provided by patient.   Review of Systems  Pertinent positive and negative review of systems noted in HPI.    Physical Exam   Vitals:   02/12/22 0430 02/12/22 0630  BP: 121/62 (!) 146/85  Pulse: 75 69  Resp: 19 20  Temp:  98.6 F (37 C)  SpO2: 97% 93%    CONSTITUTIONAL: Nontoxic-appearing, NAD NEURO:  Alert and oriented x 3, CN 3-12 grossly intact, normal strength in upper and lower extremities EYES:  eyes equal and reactive ENT/NECK:  Supple, no stridor  CARDIO: Normal rate, regular rhythm, appears well-perfused  PULM:  No respiratory distress, clear to auscultation bilaterally GI/GU:  non-distended,  MSK/SPINE:  No gross deformities, no edema, moves all extremities  SKIN:  no rash, atraumatic   *Additional and/or pertinent findings included in MDM below  Diagnostic and Interventional Summary    EKG Interpretation  Date/Time:  Friday February 11 2022 17:58:27 EST Ventricular Rate:  96 PR Interval:  174 QRS Duration: 92 QT  Interval:  356 QTC Calculation: 449 R Axis:   43 Text Interpretation: Normal sinus rhythm Cannot rule out Inferior infarct , age undetermined Abnormal ECG No significant change since last tracing Confirmed by Orpah Greek [phone]) on 02/12/2022 2:05:35 AM       Labs Reviewed  URINALYSIS, ROUTINE W REFLEX MICROSCOPIC - Abnormal; Notable for the following components:      Result Value   APPearance HAZY (*)    Hgb urine dipstick LARGE (*)    Ketones, ur 5 (*)    All other components within normal limits  CBC - Abnormal; Notable for the following components:   WBC 11.4 (*)    All other components within normal limits  DIFFERENTIAL - Abnormal; Notable for the following components:   Neutro Abs 8.4 (*)    Monocytes Absolute 1.2 (*)    All other components within normal limits  COMPREHENSIVE METABOLIC PANEL - Abnormal; Notable for the following components:   Creatinine, Ser 1.51 (*)    Total Protein 5.9 (*)    AST 85 (*)    GFR, Estimated 47 (*)    All other components within normal limits  CK - Abnormal; Notable for the following components:   Total CK 5,116 (*)    All other components within normal limits  MAGNESIUM - Abnormal; Notable for the following components:   Magnesium  2.5 (*)    All other components within normal limits  I-STAT CHEM 8, ED - Abnormal; Notable for the following components:   Creatinine, Ser 1.60 (*)    All other components within normal limits  ETHANOL  PROTIME-INR  APTT  LIPID PANEL  HEMOGLOBIN A1C  RAPID URINE DRUG SCREEN, HOSP PERFORMED  LEVETIRACETAM LEVEL  CK    MR BRAIN WO CONTRAST  Final Result    MR ANGIO HEAD WO CONTRAST  Final Result    CT HEAD WO CONTRAST  Final Result    DG Hip Unilat With Pelvis 2-3 Views Left  Final Result    VAS US CAROTID (at [facility] only)    (Results Pending)    Medications  levETIRAcetam (KEPPRA) IVPB 500 mg/100 mL premix (500 mg Intravenous Not Given 02/12/22 0611)  lactated ringers bolus  1,000 mL (1,000 mLs Intravenous Not Given 02/12/22 0611)  levETIRAcetam (KEPPRA) tablet 750 mg (has no administration in time range)   stroke: early stages of recovery book (has no administration in time range)     Procedures  /  Critical Care .Critical Care  Performed by: Montine Circle, PA-C Authorized by: Montine Circle, PA-C   Critical care provider statement:    Critical care time (minutes):  56   Critical care was necessary to treat or prevent imminent or life-threatening deterioration of the following conditions:  CNS failure or compromise and dehydration   Critical care was time spent personally by me on the following activities:  Development of treatment plan with patient or surrogate, discussions with consultants, evaluation of patient's response to treatment, examination of patient, ordering and review of laboratory studies, ordering and review of radiographic studies, ordering and performing treatments and interventions, pulse oximetry, re-evaluation of patient's condition and review of old charts   ED Course and Medical Decision Making  I have reviewed the triage vital signs, the nursing notes, and pertinent available records from the EMR.  Social Determinants Affecting Complexity of Care: Patient has no clinically significant social determinants affecting this chief complaint..   ED Course:    Medical Decision Making Patient here with lower extremity weakness.  The weakness is resolved at this point in time.  He initially said that it was his right leg, but now says that his left leg.  I have ambulated with the patient to the bathroom.  He is steady on his feet.  He does not feel weak.  He states that he wants to go home.  MRI ordered per neurology recommendations.  Also patient is noted to have elevated CK, likely from the fall.  Will give fluids and trend the CK.  I advised the patient that he should be admitted to the hospital, but he refuses.  He states that he will  follow-up with his doctor.  I gave him clear and strict return precautions regarding his elevated CK and concern for rhabdomyolysis.  The MRI of his brain was reassuring.  The patient does appear to have capacity to make his own medical decisions.  He will be signed out [name].  He is encouraged to return for any new or worsening symptoms.  I did send an increased dose of Keppra to his pharmacy per neurology recommendations.  Problems Addressed: Elevated CK: undiagnosed new problem with uncertain prognosis Weakness: undiagnosed new problem with uncertain prognosis  Amount and/or Complexity of Data Reviewed Labs: ordered. Radiology: ordered.  Risk Prescription drug management.     Consultants: I discussed the case  with Dr. Curly Sh[place], who will consult on the patient.  Recommends MRI.  Will likely need hospitalist admission for stroke rule out.  Notes that CK is elevated at slightly greater than 5000..   Treatment and Plan: Patient's exam and diagnostic results are concerning for strokelike symptoms as well as elevated CK concerning for rhabdomyolysis.  Feel that patient will need admission to the hospital for further treatment and evaluation.   Patients that they would like to leave.  I have discussed my concerns with the patient about them leaving without completing the evaluation.   Patient has capacity to make own medical decisions.  Patient presents with lower extremity weakness.  Symptoms include: Weakness is resolved, but CK elevated and concerning for early rhabdo.  Study limitations and other tests offered include: Fluids and repeat testing along with TIA/stroke work-up in the hospital.  I do not feel that the patient should leave prior to completing their workup. I have discussed the above symptoms, initial findings, study limitations, and treatment plan with the patient. Patient is not altered, does not have any distracting issues, and has capacity to make  decisions for themselves. Patient places themselves at risk of (rhabdomyolysis/renal failure/stroke), worsening symptoms, disability, morbidity and/or death.     Final Clinical Impressions(s) / ED Diagnoses     ICD-10-CM   1. Weakness  R53.1     2. Elevated CK  R74.8       ED Discharge Orders          Ordered    levETIRAcetam (KEPPRA) 750 MG tablet  2 times daily        02/12/22 0615              Discharge Instructions Discussed with and Provided to Patient:     Discharge Instructions      You need to drink plenty of fluid today.  Please have your doctor recheck your CK level.  Return if you have dark urine, muscle aches, or return of your weakness.       Montine Circle, PA-C 02/12/22 6301    Orpah Greek, MD 02/12/22 [phone]

## 2022-02-12 NOTE — ED Notes (Signed)
Neuro at bedside.

## 2022-02-12 NOTE — Consult Note (Signed)
Neurology Consultation Reason for Consult: Leg weakness Requesting Physician: Joseph Berkshire  CC: Left leg weakness   History is obtained from: Patient, wife and chart review   HPI: Derek Cox is a 76 y.o. 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  History is a little inconsistent.  To the best my ability to determine he went outside this morning to do something in the yard but then was unable to do it because when he bent over he had some tremoring in his left leg which he reports is not an uncommon issue when he puts pressure on the leg.  This resulted in a mechanical fall.  His wife found him outside and he was unable to move the left leg, but otherwise had no right-sided weakness no aphasia, no confusion, no loss of consciousness, no bowel or bladder incontinence or tongue bite.  EMS was activated but he refused transport to the hospital and instead was helped back to bed by EMS.  Due to persistent leg weakness he did come to the ED at approximately 4 PM at which time he was still having leg weakness.  Wife and patient note that they are positive it was his left leg that was weak because this was marked by EMS on the dorsalis pedis pulse site with an "x" Subsequently he really needed to urinate around 10 PM and ambulated with assistance of bedside nurse, at that time discovering his ambulation was at his baseline and his weakness had resolved.  Unclear if he gradually recovered or not given the limited activity throughout the day since the inciting event.    Regarding inconsistent history: ED provider initially reported to me that the right leg was weak,  nursing reports that for them also the right side was weak.  Patient and wife denied pain to both me and ED provider but triage note notes there was pain in the left leg.  Telephone note from reaching out to primary care office at 11 AM notes weakness in the "legs" Additionally when I discussed with patient and wife that they are seeing Dr. Lavell Anchors, they reported they had not seen her in a couple of years; on review of the chart she saw them last in May 2023.  They did note that they had discussed a DaT scan and had ultimately decided not to do the test because it would take too long.  I did not discuss with him whether he had started carbidopa/levodopa or pursue to the cognitive testing  Regarding his seizure history they note he had generalized tonic-clonic seizure activity last in 2010 after his prophylactic Dilantin was stopped.  Since then he has been stable on Keppra.  He reports not missing any doses.  He reports he is not taking the temazepam that is listed on his chart, but he does take Xanax 1 mg nightly for sleep  LKW: ~8:30 AM Thrombolytic given?: No, out of the window  IA performed?: No, exam no c/w LVO Premorbid modified rankin scale:      2 - Slight disability. Able to look after own affairs without assistance, but unable to carry out all previous activities.  Wife notes some mild confusion and intermittent agitation since stroke  Review of systems  was negative except as noted in the HPI, though limited by his cognitive impairment  Past Medical History:  Diagnosis Date   Arthritis    Left hand   BPH with urinary obstruction    sees Dr. Diona Fanti   Cancer 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   History of dysuria    post surgical   Hyperlipidemia    Hypertension    echo done - 10/10   Inguinal hernia    Seizures ([place])    after stroke , upon self d/c'ing Dilantin, since on Keppra, no repeat  of seizure    Sleep apnea    minor sleep apnea - study before 2010, no CPAP   Stroke ([place])    2010, treated at [facility] for evacuation of ICH, then rehabed at [facility], no residual deficits    Past Surgical History:  Procedure Laterality Date   ANTERIOR CERVICAL DECOMP/DISCECTOMY FUSION N/A 11/15/2012   Procedure: ANTERIOR CERVICAL DECOMPRESSION/DISCECTOMY FUSION 1 LEVEL,CERVICAL FOUR-FIVE;  Surgeon: Ophelia Charter, MD;  Location: [place] [facility];  Service: Neurosurgery;  Laterality: N/A;   BRAIN HEMATOMA EVACUATION Left 2010   BRAIN SURGERY     bleeding on brain   CERVICAL FUSION  2007   2 surgeries   COLONOSCOPY  2006   per Dr. Timmothy Euler, clear. Negative Cologuard 04-28-17     CYSTOSCOPY WITH INSERTION OF UROLIFT N/A 07/25/2016   Procedure: CYSTOSCOPY WITH INSERTION OF UROLIFT x4;  Surgeon: Franchot Gallo, MD;  Location: [facility];  Service: Urology;  Laterality: N/A;   HERNIA REPAIR Bilateral    inguinal hernia - repair, [facility]- 20 yrs ago     ICH evacuation  2010   INGUINAL HERNIA REPAIR  08/11/2011   Procedure: LAPAROSCOPIC INGUINAL HERNIA;  Surgeon: Madilyn Hook, DO;  Location: [facility];  Service: General;  Laterality: Right;   INGUINAL HERNIA REPAIR     KNEE SURGERY     MOLE REMOVAL     non melanoma   PILONIDAL CYST EXCISION     TONSILLECTOMY      Family History  Problem Relation Age of Onset   Stroke Father    Lung cancer Father    Anesthesia problems Neg Hx    Parkinsonism Neg Hx     Social History:  reports that he quit smoking about 25 years ago. His smoking use included cigarettes. He has a 10.00 pack-year smoking history. He has never used smokeless tobacco. He reports current alcohol use of about 21.0 standard drinks of alcohol per week. He reports that he does not use drugs.   Exam: Current vital signs: BP (!) 146/80   Pulse 73   Temp 97.6 F (36.4 C) (Oral)   Resp 17   Ht '5\' 10"'$  (1.778 m)   Wt 74.8 kg   SpO2 96%   BMI 23.68 kg/m  Vital signs in last 24 hours: Temp:   [97.6 F (36.4 C)-98.3 F (36.8 C)] 97.6 F (36.4 C) (11/18 0000) Pulse Rate:  [71-96] 73 (11/18 0230) Resp:  [12-20] 17 (11/18 0230) BP: (128-146)/(78-97) 146/80 (11/18 0230) SpO2:  [94 %-97 %] 96 % (11/18 0230) Weight:  [74.8 kg] 74.8 kg (11/17 1823)   Physical Exam  Constitutional: Appears well-developed and well-nourished.  Psych: Affect appropriate to situation, calm and cooperative at times slightly frustrated by exam Eyes: No scleral injection HENT: No oropharyngeal obstruction.  MSK: no joint deformities.  Cardiovascular: Normal rate and regular rhythm.  Perfusing extremities well Respiratory: Effort normal, non-labored breathing GI: Soft.  No distension. There is no tenderness.  Skin: Warm dry and intact visible skin  Neuro: Mental Status: Patient is awake, alert, oriented to person, place, situation, month (w/ unsolicited hint from wife) but not year Patient is able to give a clear and coherent history, occasional difficulty understanding commands, occasional speech hesitancy, asked the same question about plan repeatedly. No neglect  Cranial Nerves: II: Visual Fields are full (repeated testing required to avoid foveating on fingers). Pupils are equal, round, and reactive to light.   III,IV, VI: EOMI without ptosis or diploplia. 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 XII: tongue is midline without atrophy or fasciculations.  Motor: Tone is normal. Bulk is normal. 5/5 strength was present in all four extremities other than 4++/5 right elbow extension and right knee flexion.  Mild pronation of the right upper extremity without drift Sensory: Sensation is symmetric to light touch and temperature in the arms and legs.  Reports slightly stronger vibration sense at the Left great toe than the right (slightly limited by aphasia, with repeated testing did seem to have intact vibration at both great toes) Deep Tendon Reflexes: 1+ and symmetric brachioradialis. 2+ right patella, 3+ left patella Plantars: Toes are downgoing bilaterally.  Cerebellar: FNF and HKS are intact bilaterally Gait:  Deferred for safety (no grippy socks available)   NIHSS total 1 Score breakdown: One-point for not correctly answering his age, aphasia too mild to score Performed at time of patient arrival to ED    I have reviewed labs in epic and the results pertinent to this consultation are:  Basic Metabolic Panel: Recent Labs  Lab 02/12/22 0117 02/12/22 0136  NA 141 140  K 3.5 3.6  CL 107 106  CO2 24  --   GLUCOSE 94 87  BUN 20 20  CREATININE 1.51* 1.60*  CALCIUM 9.4  --     CBC: Recent Labs  Lab 02/12/22 0117 02/12/22 0136  WBC 11.4*  --   NEUTROABS 8.4*  --   HGB 13.8 13.3  HCT 41.4 39.0  MCV 93.5  --   PLT 186  --     Coagulation Studies: Recent Labs    02/12/22 0117  LABPROT 14.0  INR 1.1    UA notable for hemoglobin pigment without red blood cells and creatinine is elevated to 1.6 from baseline of 1.1 a month ago  I have reviewed the images obtained:   Head CT personally reviewed, agree with radiology No evidence of acute intracranial abnormality. Encephalomalacic changes in the left frontal lobe. Small vessel ischemic changes.   Impression: Etiology of his left leg numbness and weakness that could be mild spinal cord contusion after his fall versus small stroke/TIA.  Given the duration of symptoms for at least 8 hours, if this was due to an interruption of blood supply to the brain, and I expect there should be some finding of at least a small stroke on MRI, and patient/wife have expressed strong interest in preferring discharge to admission if safe.  Therefore think it is reasonable to obtain  an MRI of his brain and admit for stroke work-up only if it is positive  Recommendations: - CK  given urinary findings and elevated creatinine concerning for possible rhabdomyolysis - MRI brain w/o contrast, if negative, outpatient follow-up for transient leg weakness, if positive, admit for stroke workup  Addendum:   CK elevation concerning for rhabdo, suspicious for breakthrough seizure as an etiology, especially given nursing report of right sided weakness on her earlier eval (with very minimal right sided weakness for me), suspicious for post-ictal [place].  However given patient and wife both providing me very consistent history that it was left-sided weakness that he suffered, I do think pursuing a stroke/TIA work-up concurrently is reasonable especially given the lack of clarity on timeline of symptoms etc.  - Trend CK to peak every 12 hours (ordered),  - IVF management per primary team (to be ordered) - Increase keppra to 750 mg BID (long term consider changing to another agent to see if it helps patient's irritability) (ordered) - MRA head, A1c, Lipid panel, ECHO, Carotid U/S (ordered) - If no evidence of CAA on MRI brain, start ASA 81 mg at a minimum (to be ordered) - 5 mg crestor to be increased if needed for goal LDL < 70  - Permissive hypertension pending MRI brain or 24-48 hours after symptom onset if MRI demonstrates an acute stroke - Stroke team to follow  Thank you for involving neurology in care of this patient  Lesleigh Noe MD-PhD [facility] [phone] Available 7 AM to 7 PM, outside these hours please contact Neurologist on call listed on AMION

## 2022-02-14 ENCOUNTER — Other Ambulatory Visit: Payer: Self-pay

## 2022-02-14 ENCOUNTER — Telehealth: Payer: Self-pay | Admitting: Neurology

## 2022-02-14 MED ORDER — CIPROFLOXACIN HCL 500 MG PO TABS: 500.0000 mg | ORAL_TABLET | Freq: Two times a day (BID) | ORAL | 0 refills | Status: AC

## 2022-02-14 NOTE — Telephone Encounter (Signed)
I reviewed the scans of his head (CT and MRI), and these came out fine. They were normal except for some scars from a previous procedure for a brain bleed. The labs were mostly unremarkable but the infection count (WBC) was slightly elevated, possibly indicating an infection. A urine infection would be the most likely, so I agree with treating him for this. Call in Cipro 500 mg BID for 10 days. Have him see me in the clinic next week

## 2022-02-14 NOTE — Telephone Encounter (Signed)
Lvm for pt wife to call back to discuss message

## 2022-02-14 NOTE — Telephone Encounter (Signed)
I see that wife did speak to pcp and is being treated for UTI and will see him 02-23-2022. I LMVM for pts wife if needs to still speak to [place] then call us back.

## 2022-02-14 NOTE — Telephone Encounter (Signed)
Spoke with Derek Cox verbalized understanding, Derek Cox Rx for Cipro was sent to his pharmacy, Derek Cox has appointment for 02/23/22 at 1.30 pm per Dr Sarajane Jews

## 2022-02-14 NOTE — Telephone Encounter (Signed)
Patient's wife calling back regarding this situation and is requesting someone call her back to discuss. States patient is still experiencing dizziness, disoriented. Lost use of left leg Friday morning while in the yard, paramedics came and patient declined transport. went to ED Saturday and was diagnosed with UTI, ED wanted to admit him, patient declined, left with no meds for treatment.

## 2022-02-14 NOTE — Telephone Encounter (Signed)
Spoke with patient wife.  She stated that the patient had a series of different test urine, blood test  also  images, MRI, CT, xray  on 02/11/22 at the ED.    Pt wife requested for Dr Sarajane Jews to please look over the images and give advisement.     She stated that no office visit  appointment was available this week to see Dr. Sarajane Jews but she really wants to hear from his point of view as what the results meant.  She has concerns about the patients Urine results, she thinks he may have a UTI.

## 2022-02-14 NOTE — Telephone Encounter (Signed)
Pt wife Constance Holster is calling. Stated she would like to talk to a nurse about pt. Stated pt was at the hospital last week and she would like Dr. Jaynee Eagles to look at the test results because the hospital couldn't find anything wrong. Said pt loss feeling in his left leg.

## 2022-02-15 NOTE — Telephone Encounter (Signed)
Pt wife is calling back. Stated she would like to speak with nurse. She is requesting a call- back.

## 2022-02-15 NOTE — Telephone Encounter (Signed)
LMVM for pts wife that returned call.

## 2022-02-15 NOTE — Telephone Encounter (Signed)
I don't see a stroke on MRI although it could have been many things I really can't comment unless I see him. If the symptoms are resolved I wouldn't do anymore workup if they continue we can see him in the office

## 2022-02-15 NOTE — Telephone Encounter (Signed)
I called wife.  I relayed Dr. Jaynee Eagles' s message and after offering appt in Mar 23 2022 at 0830.  They will see how things go and will call us back.  He did not have the dat scan or US carotid.

## 2022-02-21 ENCOUNTER — Ambulatory Visit (INDEPENDENT_AMBULATORY_CARE_PROVIDER_SITE_OTHER): Payer: Medicare Other | Admitting: Family Medicine

## 2022-02-21 ENCOUNTER — Encounter: Payer: Self-pay | Admitting: Family Medicine

## 2022-02-21 VITALS — BP 128/80 | HR 70 | Temp 97.7°F | Wt 168.0 lb

## 2022-02-21 DIAGNOSIS — T796XXD Traumatic ischemia of muscle, subsequent encounter: Secondary | ICD-10-CM | POA: Diagnosis not present

## 2022-02-21 DIAGNOSIS — N390 Urinary tract infection, site not specified: Secondary | ICD-10-CM

## 2022-02-21 DIAGNOSIS — G459 Transient cerebral ischemic attack, unspecified: Secondary | ICD-10-CM

## 2022-02-21 LAB — BASIC METABOLIC PANEL
BUN: 14 mg/dL (ref 6–23)
CO2: 27 mEq/L (ref 19–32)
Calcium: 9.1 mg/dL (ref 8.4–10.5)
Chloride: 101 mEq/L (ref 96–112)
Creatinine, Ser: 1.27 mg/dL (ref 0.40–1.50)
GFR: 54.29 mL/min — ABNORMAL LOW (ref >60.00)
Glucose, Bld: 90 mg/dL (ref 70–99)
Potassium: 4.1 mEq/L (ref 3.5–5.1)
Sodium: 136 mEq/L (ref 135–145)

## 2022-02-21 LAB — CK: Total CK: 428 U/L — ABNORMAL HIGH (ref 7–232)

## 2022-02-21 LAB — CBC WITH DIFFERENTIAL/PLATELET
Basophils Absolute: 0.1 10*3/uL (ref 0.0–0.1)
Basophils Relative: 0.9 % (ref 0.0–3.0)
Eosinophils Absolute: 0.2 10*3/uL (ref 0.0–0.7)
Eosinophils Relative: 2.4 % (ref 0.0–5.0)
HCT: 44.1 % (ref 39.0–52.0)
Hemoglobin: 15.1 g/dL (ref 13.0–17.0)
Lymphocytes Relative: 18.4 % (ref 12.0–46.0)
Lymphs Abs: 1.3 10*3/uL (ref 0.7–4.0)
MCHC: 34.1 g/dL (ref 30.0–36.0)
MCV: 92.2 fl (ref 78.0–100.0)
Monocytes Absolute: 0.7 10*3/uL (ref 0.1–1.0)
Monocytes Relative: 10.3 % (ref 3.0–12.0)
Neutro Abs: 4.9 10*3/uL (ref 1.4–7.7)
Neutrophils Relative %: 68 % (ref 43.0–77.0)
Platelets: 233 10*3/uL (ref 150.0–400.0)
RBC: 4.79 Mil/uL (ref 4.22–5.81)
RDW: 12.6 % (ref 11.5–15.5)
WBC: 7.2 10*3/uL (ref 4.0–10.5)

## 2022-02-21 NOTE — Progress Notes (Signed)
   Subjective:    Patient ID: Derek Cox, male    DOB: 04-03-1945, 76 y.o.   MRN: [id number]  HPI Here with his wife to follow up an ED stay from 02-11-22 to 02-12-22 for right leg weakness and multiple falls at home. He was working out in his yard when he had sudden weakness and numbness in the right leg, causing him to fall to the ground. He required assistance to get into the house, where he fell 4 or 5 more times. He denies any other symptoms, no chest pain or SOB or headache. EMS was called, and they wanted to take him to the ED, but he refused. Then when his wife called [place] here at the clinic, we insisted he go to to the ED, so his wife drove him there. By the time he was evaluated, the leg weakness had resolved. He had a normal exam, but labs revealed an elevated CK to 5116, and his creatinine had bumped up to 1.51. His WBC was 11.4, and his UA showed large blood present. An MR angiogram of the brain was unremarkable. He was treated with IV fluids, and it was recommended that he be admitted, but he refused and left AMA. He was started on 10 days of Cipro for a possible UTI. Since then he has felt fine with no further weakness and no further falls.    Review of Systems  Constitutional: Negative.   Respiratory: Negative.    Cardiovascular: Negative.   Gastrointestinal: Negative.   Genitourinary: Negative.   Neurological:  Positive for weakness and numbness. Negative for dizziness, tremors, seizures, syncope, facial asymmetry, speech difficulty, light-headedness and headaches.       Objective:   Physical Exam Constitutional:      Appearance: Normal appearance. He is not ill-appearing.  Cardiovascular:     Rate and Rhythm: Normal rate and regular rhythm.     Pulses: Normal pulses.     Heart sounds: Normal heart sounds.  Pulmonary:     Effort: Pulmonary effort is normal.     Breath sounds: Normal breath sounds.  Neurological:     General: No focal deficit present.     Mental  Status: He is alert and oriented to person, place, and time.     Cranial Nerves: No cranial nerve deficit.     Motor: No weakness.     Coordination: Coordination normal.     Gait: Gait normal.           Assessment & Plan:  Here to follow up on right leg weakness and falls at home, and also for AKI caused by rhabdomyolysis. He was treated for a UTI, but I suspect he may have had a TIA. He is already taking ASA and a statin. We will check a BMET, CK, and CBC today. He will finish up the Cipro and drink plenty of water. He knows to be on the lookout for any neurologic deficits and to go to the ED immediately if these occur. We spent a total of (35   ) minutes reviewing records and discussing these issues.  Alysia Penna, MD

## 2022-02-22 DIAGNOSIS — C44329 Squamous cell carcinoma of skin of other parts of face: Secondary | ICD-10-CM | POA: Diagnosis not present

## 2022-02-23 ENCOUNTER — Ambulatory Visit: Payer: Medicare Other | Admitting: Family Medicine

## 2022-02-24 ENCOUNTER — Telehealth: Payer: Self-pay

## 2022-02-24 NOTE — Telephone Encounter (Signed)
        Patient  visited [facility] [place] on 02/12/2022  for chief complaint of right lower extremity weakness.   Telephone encounter attempt :  1st  A HIPAA compliant voice message was left requesting a return call.  Instructed patient to call back at [phone].   [place] Resource Care Guide   ??[email or web address]  ?? [phone]   Website: [email or web address]  [email or web address]

## 2022-02-25 ENCOUNTER — Telehealth: Payer: Self-pay | Admitting: Pharmacist

## 2022-02-25 ENCOUNTER — Telehealth: Payer: Self-pay

## 2022-02-25 NOTE — Telephone Encounter (Signed)
        Patient  visited The [place] on 02/12/2022  for chief complaint of right lower extremity weakness.   Telephone encounter attempt :  2nd  A HIPAA compliant voice message was left requesting a return call.  Instructed patient to call back at [phone].   [place] Resource Care Guide   ??[email or web address]  ?? [phone]   Website: [email or web address]  [email or web address]

## 2022-02-25 NOTE — Chronic Care Management (AMB) (Unsigned)
Chronic Care Management Pharmacy Assistant   Name: Derek Cox  MRN: [id number] DOB: 03/04/1946  Reason for Encounter: [facility] follow up    Recent office visits:  02/21/22 Derek Morale, MD - Patient presented for TIA and other concerns. No medication changes.  12/15/21 Derek Morale, MD - Patient presented for pain of right hand. No medication changes.  11/12/21 Derek Morale, MD - Patient presented for Primary hypertension and other concerns. Changed Tamsulosin. Stopped Sinemet  10/04/21 Derek, [name], MD - Patient presented via video for COVID 19 virus. Prescribed Lagevrio.   Recent consult visits:  02/08/22 Derek Cox (Dermatology) - Patient presented for Inflamed seborrheic keratosis and other concerns. Cryotherapy and Biopsy done no medication changes.   12/22/21 Cox, Derek Breslow, PA-C  - Patient presented to [facility] for Right finger pain and Diagnostic Imaging of Hand. No medication changes    Hospital visits:  Medication Reconciliation was completed by comparing discharge summary, patient's EMR and Pharmacy list, and upon discussion with patient.  Patient [facility] ED on 02/11/22 due to Weakness and other concerns. Patient was present for 12 hours.  New?Medications Started at [facility] Discharge:?? -started  none  Medication Changes at Hospital Discharge: -Changed  levETIRAcetam 750 MG tablet  Medications Discontinued at Hospital Discharge: -Stopped  none  Medications that remain the same after Hospital Discharge:??  -All other medications will remain the same.    Medications: Outpatient Encounter Medications as of 02/25/2022  Medication Sig   ALPRAZolam (XANAX) 1 MG tablet TAKE 1 TABLET (1 MG TOTAL) BY MOUTH AT BEDTIME AS NEEDED FOR SLEEP.   levETIRAcetam (KEPPRA) 750 MG tablet Take 1 tablet (750 mg total) by mouth 2 (two) times daily.   lisinopril (ZESTRIL) 20 MG tablet TAKE 1 TABLET EVERY  DAY   rosuvastatin (CRESTOR) 5 MG tablet Take 5 mg by mouth daily.   tamsulosin (FLOMAX) 0.4 MG CAPS capsule Take 2 capsules (0.8 mg total) by mouth daily. TAKE 1 CAPSULE (0.4 MG TOTAL) BY MOUTH DAILY   No facility-administered encounter medications on file as of 02/25/2022.   Contacted Tory Olen Cordial for General Review Call    Adherence Review:  Does the Clinical Pharmacist Assistant have access to adherence rates? Yes Adherence rates for STAR metric medications  Lisinopril 20 mg Last filled 12/25/21 90 DS at [facility]  Lisinopril 20 mg Last filled 10/13/21 90 DS at [facility]  Rosuvastatin 5 mg Last filled 12/25/21 90 DS at [facility] Rosuvastatin 5 mg Last filled 10/13/21 90 DS at [facility] Does the patient have >5 day gap between last estimated fill dates for any of the above medications or other medication gaps? No Do you receive your medications through PAP? No    Disease State Questions:  Able to connect with Patient? {yes/no:20286} Did patient have any problems with their health recently? {yes/no:20286} Note problems and Concerns: Have you had any admissions or emergency room visits or worsening of your condition(s) since last visit? {yes/no:20286} Details of ED visit, hospital visit and/or worsening condition(s): Have you had any visits with new specialists or providers since your last visit? {yes/no:20286} Explain: Have you had any new health care problem(s) since your last visit? {yes/no:20286} New problem(s) reported: Have you run out of any of your medications since you last spoke with clinical pharmacist? {yes/no:20286} What caused you to run out of your medications? Are there any medications you are not taking as prescribed? {yes/no:20286} What kept you from  taking your medications as prescribed? Are you having any issues or side effects with your medications? {yes/no:20286} Note of issues or side effects: Do you have any other health concerns or questions you want to discuss with  your Clinical Pharmacist before your next visit? {yes/no:20286} Note additional concerns and questions from Patient. Are there any health concerns that you feel we can do a better job addressing? {yes/no:20286} Note Patient's response. Are you having any problems with any of the following since the last visit: (select all that apply)  {General Call:27390}  Details: 12. Any falls since last visit? {yes/no:20286}  Details: 13. Any increased or uncontrolled pain since last visit? {yes/no:20286}  Details: 14. Next visit Type: {Telephone/Office:25179}       Visit with:        Date:        Time:  39. Additional Details? {yes/no:20286}    Care Gaps: Hepatitis C Screen - Overdue Zoster Vaccine - Overdue Flu Vaccine - Overdue COVID Booster - Overdue AWV- 04/12/21  Star Rating Drugs: Lisinopril 20 mg Last filled 12/25/21 90 DS at [name]  Rosuvastatin 5 mg Last filled 12/25/21 90 DS at [facility] Pharmacist Assistant [phone]

## 2022-02-28 ENCOUNTER — Telehealth: Payer: Self-pay

## 2022-02-28 NOTE — Telephone Encounter (Signed)
        Patient  visited The [place] on 02/12/2022  for chief complaint of right lower extremity weakness.   Telephone encounter attempt :  3rd  A HIPAA compliant voice message was left requesting a return call.  Instructed patient to call back at [phone].   [place] Resource Care Guide   ??[email or web address]  ?? [phone]   Website: [email or web address]  [email or web address]

## 2022-03-02 DIAGNOSIS — N401 Enlarged prostate with lower urinary tract symptoms: Secondary | ICD-10-CM | POA: Diagnosis not present

## 2022-03-02 DIAGNOSIS — R351 Nocturia: Secondary | ICD-10-CM | POA: Diagnosis not present

## 2022-03-09 ENCOUNTER — Other Ambulatory Visit: Payer: Self-pay | Admitting: Family Medicine

## 2022-03-15 DIAGNOSIS — Z8589 Personal history of malignant neoplasm of other organs and systems: Secondary | ICD-10-CM | POA: Diagnosis not present

## 2022-03-15 DIAGNOSIS — Z5189 Encounter for other specified aftercare: Secondary | ICD-10-CM | POA: Diagnosis not present

## 2022-03-23 ENCOUNTER — Ambulatory Visit: Payer: Medicare Other | Admitting: Neurology

## 2022-04-01 ENCOUNTER — Ambulatory Visit: Payer: Medicare Other | Admitting: Neurology

## 2022-04-10 ENCOUNTER — Other Ambulatory Visit: Payer: Self-pay | Admitting: Family Medicine

## 2022-04-10 DIAGNOSIS — I10 Essential (primary) hypertension: Secondary | ICD-10-CM

## 2022-04-10 DIAGNOSIS — N138 Other obstructive and reflux uropathy: Secondary | ICD-10-CM

## 2022-04-11 ENCOUNTER — Other Ambulatory Visit: Payer: Self-pay

## 2022-04-13 ENCOUNTER — Telehealth: Payer: Self-pay

## 2022-04-13 NOTE — Telephone Encounter (Signed)
Contacted patient on preferred number listed in notes for scheduled AWV.Marland Kitchen Patient stated he does not agree with this type of visit and refused to complete. Stated Do not reschedule.

## 2022-04-25 ENCOUNTER — Telehealth: Payer: Self-pay | Admitting: Family Medicine

## 2022-04-25 NOTE — Telephone Encounter (Signed)
Left message for patient to call back and schedule Medicare Annual Wellness Visit (AWV) either virtually or in office. Left  my Derek Cox number [phone]   Last AWV 04/22/21 please schedule with Nurse Health Adviser   45 min for awv-i  in office appointments 30 min for awv-s & awv-i phone/virtual appointments

## 2022-04-25 NOTE — Telephone Encounter (Signed)
Left message for patient to call back and schedule Medicare Annual Wellness Visit (AWV) either virtually or in office. Left  my Herbie Drape number [phone]   Last AWV 04/12/21 please schedule with Nurse Health Adviser   45 min for awv-i  in office appointments 30 min for awv-s & awv-i phone/virtual appointments

## 2022-04-27 DIAGNOSIS — L570 Actinic keratosis: Secondary | ICD-10-CM | POA: Diagnosis not present

## 2022-04-27 DIAGNOSIS — L814 Other melanin hyperpigmentation: Secondary | ICD-10-CM | POA: Diagnosis not present

## 2022-04-27 DIAGNOSIS — D225 Melanocytic nevi of trunk: Secondary | ICD-10-CM | POA: Diagnosis not present

## 2022-04-27 DIAGNOSIS — L111 Transient acantholytic dermatosis [Grover]: Secondary | ICD-10-CM | POA: Diagnosis not present

## 2022-04-27 DIAGNOSIS — L821 Other seborrheic keratosis: Secondary | ICD-10-CM | POA: Diagnosis not present

## 2022-04-27 DIAGNOSIS — S81812A Laceration without foreign body, left lower leg, initial encounter: Secondary | ICD-10-CM | POA: Diagnosis not present

## 2022-04-27 DIAGNOSIS — Z85828 Personal history of other malignant neoplasm of skin: Secondary | ICD-10-CM | POA: Diagnosis not present

## 2022-06-15 ENCOUNTER — Ambulatory Visit (INDEPENDENT_AMBULATORY_CARE_PROVIDER_SITE_OTHER): Payer: Medicare Other | Admitting: Family Medicine

## 2022-06-15 ENCOUNTER — Encounter: Payer: Self-pay | Admitting: Family Medicine

## 2022-06-15 VITALS — BP 110/70 | HR 96 | Temp 98.1°F | Wt 160.0 lb

## 2022-06-15 DIAGNOSIS — J40 Bronchitis, not specified as acute or chronic: Secondary | ICD-10-CM | POA: Diagnosis not present

## 2022-06-15 DIAGNOSIS — R059 Cough, unspecified: Secondary | ICD-10-CM

## 2022-06-15 LAB — POC COVID19 BINAXNOW: SARS Coronavirus 2 Ag: NEGATIVE

## 2022-06-15 MED ORDER — AZITHROMYCIN 250 MG PO TABS: ORAL_TABLET | ORAL | 0 refills | Status: DC

## 2022-06-15 NOTE — Progress Notes (Signed)
   Subjective:    Patient ID: Derek Cox, male    DOB: May 23, 1945, 77 y.o.   MRN: [id number]  HPI Here for 5 days of chest congestion and coughing up yellow sputum no chest pain or SOB. No ST or fever.    Review of Systems  Constitutional: Negative.   HENT:  Positive for congestion. Negative for ear pain, postnasal drip, sinus pressure and sore throat.   Eyes: Negative.   Respiratory:  Positive for cough. Negative for shortness of breath and wheezing.        Objective:   Physical Exam Constitutional:      Appearance: Normal appearance. He is not ill-appearing.  HENT:     Right Ear: Tympanic membrane, ear canal and external ear normal.     Left Ear: Tympanic membrane, ear canal and external ear normal.     Nose: Nose normal.     Mouth/Throat:     Pharynx: Oropharynx is clear.  Eyes:     Conjunctiva/sclera: Conjunctivae normal.  Pulmonary:     Effort: Pulmonary effort is normal.     Breath sounds: Rhonchi present. No wheezing or rales.  Lymphadenopathy:     Cervical: No cervical adenopathy.  Neurological:     Mental Status: He is alert.           Assessment & Plan:  Bronchitis, treat with a Zpack.  Alysia Penna, MD

## 2022-06-27 ENCOUNTER — Other Ambulatory Visit: Payer: Self-pay | Admitting: Family Medicine

## 2022-07-26 ENCOUNTER — Other Ambulatory Visit: Payer: Self-pay | Admitting: Family Medicine

## 2022-07-27 NOTE — Telephone Encounter (Signed)
Pt LOV was on 06/15/22 Last refill was done on 02/12/22 at the ED Please advise if Rx should be refilled

## 2022-08-23 DIAGNOSIS — D485 Neoplasm of uncertain behavior of skin: Secondary | ICD-10-CM | POA: Diagnosis not present

## 2022-08-23 DIAGNOSIS — C44719 Basal cell carcinoma of skin of left lower limb, including hip: Secondary | ICD-10-CM | POA: Diagnosis not present

## 2022-09-06 DIAGNOSIS — D0472 Carcinoma in situ of skin of left lower limb, including hip: Secondary | ICD-10-CM | POA: Diagnosis not present

## 2022-09-06 DIAGNOSIS — D485 Neoplasm of uncertain behavior of skin: Secondary | ICD-10-CM | POA: Diagnosis not present

## 2022-09-06 DIAGNOSIS — S90811A Abrasion, right foot, initial encounter: Secondary | ICD-10-CM | POA: Diagnosis not present

## 2022-09-12 ENCOUNTER — Telehealth: Payer: Self-pay | Admitting: Family Medicine

## 2022-09-12 NOTE — Telephone Encounter (Signed)
[facility] called to request a refill of the:   levETIRAcetam (KEPPRA) 750 MG tablet  Please send via:  [facility] - [place], [place] - [address] Phone: [phone]  Fax: [phone]

## 2022-09-13 MED ORDER — LEVETIRACETAM 750 MG PO TABS: 750.0000 mg | ORAL_TABLET | Freq: Two times a day (BID) | ORAL | 3 refills | Status: DC

## 2022-09-13 NOTE — Telephone Encounter (Signed)
Done

## 2022-09-13 NOTE — Telephone Encounter (Signed)
Pt LOV was on 06/15/22 Last refill was done on 02/12/22 Please advise

## 2022-10-07 DIAGNOSIS — C44719 Basal cell carcinoma of skin of left lower limb, including hip: Secondary | ICD-10-CM | POA: Diagnosis not present

## 2022-10-24 DIAGNOSIS — L821 Other seborrheic keratosis: Secondary | ICD-10-CM | POA: Diagnosis not present

## 2022-10-24 DIAGNOSIS — T1490XD Injury, unspecified, subsequent encounter: Secondary | ICD-10-CM | POA: Diagnosis not present

## 2022-10-24 DIAGNOSIS — L814 Other melanin hyperpigmentation: Secondary | ICD-10-CM | POA: Diagnosis not present

## 2022-10-24 DIAGNOSIS — Z85828 Personal history of other malignant neoplasm of skin: Secondary | ICD-10-CM | POA: Diagnosis not present

## 2022-10-24 DIAGNOSIS — D225 Melanocytic nevi of trunk: Secondary | ICD-10-CM | POA: Diagnosis not present

## 2022-10-24 DIAGNOSIS — L570 Actinic keratosis: Secondary | ICD-10-CM | POA: Diagnosis not present

## 2022-11-03 DIAGNOSIS — L929 Granulomatous disorder of the skin and subcutaneous tissue, unspecified: Secondary | ICD-10-CM | POA: Diagnosis not present

## 2022-11-17 ENCOUNTER — Other Ambulatory Visit: Payer: Self-pay | Admitting: Family Medicine

## 2022-11-17 DIAGNOSIS — N401 Enlarged prostate with lower urinary tract symptoms: Secondary | ICD-10-CM

## 2022-12-22 ENCOUNTER — Other Ambulatory Visit: Payer: Self-pay | Admitting: Family Medicine

## 2023-01-25 ENCOUNTER — Encounter: Payer: Self-pay | Admitting: Family Medicine

## 2023-01-25 ENCOUNTER — Ambulatory Visit: Payer: Medicare Other | Admitting: Family Medicine

## 2023-01-25 VITALS — BP 136/82 | HR 75 | Temp 98.1°F | Ht 70.0 in | Wt 164.4 lb

## 2023-01-25 DIAGNOSIS — I10 Essential (primary) hypertension: Secondary | ICD-10-CM | POA: Diagnosis not present

## 2023-01-25 DIAGNOSIS — G8929 Other chronic pain: Secondary | ICD-10-CM

## 2023-01-25 DIAGNOSIS — R972 Elevated prostate specific antigen [PSA]: Secondary | ICD-10-CM | POA: Diagnosis not present

## 2023-01-25 DIAGNOSIS — M545 Low back pain, unspecified: Secondary | ICD-10-CM

## 2023-01-25 DIAGNOSIS — G20C Parkinsonism, unspecified: Secondary | ICD-10-CM | POA: Diagnosis not present

## 2023-01-25 DIAGNOSIS — G4700 Insomnia, unspecified: Secondary | ICD-10-CM

## 2023-01-25 DIAGNOSIS — N401 Enlarged prostate with lower urinary tract symptoms: Secondary | ICD-10-CM | POA: Diagnosis not present

## 2023-01-25 DIAGNOSIS — N138 Other obstructive and reflux uropathy: Secondary | ICD-10-CM | POA: Diagnosis not present

## 2023-01-25 DIAGNOSIS — E782 Mixed hyperlipidemia: Secondary | ICD-10-CM

## 2023-01-25 DIAGNOSIS — Z1211 Encounter for screening for malignant neoplasm of colon: Secondary | ICD-10-CM

## 2023-01-25 DIAGNOSIS — N529 Male erectile dysfunction, unspecified: Secondary | ICD-10-CM | POA: Diagnosis not present

## 2023-01-25 DIAGNOSIS — R739 Hyperglycemia, unspecified: Secondary | ICD-10-CM

## 2023-01-25 DIAGNOSIS — Z8673 Personal history of transient ischemic attack (TIA), and cerebral infarction without residual deficits: Secondary | ICD-10-CM

## 2023-01-25 LAB — CBC WITH DIFFERENTIAL/PLATELET
Basophils Absolute: 0 10*3/uL (ref 0.0–0.1)
Basophils Relative: 0.4 % (ref 0.0–3.0)
Eosinophils Absolute: 0.1 10*3/uL (ref 0.0–0.7)
Eosinophils Relative: 1.2 % (ref 0.0–5.0)
HCT: 45.3 % (ref 39.0–52.0)
Hemoglobin: 15 g/dL (ref 13.0–17.0)
Lymphocytes Relative: 16.7 % (ref 12.0–46.0)
Lymphs Abs: 1.3 10*3/uL (ref 0.7–4.0)
MCHC: 33.2 g/dL (ref 30.0–36.0)
MCV: 92.6 fL (ref 78.0–100.0)
Monocytes Absolute: 0.6 10*3/uL (ref 0.1–1.0)
Monocytes Relative: 7 % (ref 3.0–12.0)
Neutro Abs: 5.9 10*3/uL (ref 1.4–7.7)
Neutrophils Relative %: 74.7 % (ref 43.0–77.0)
Platelets: 233 10*3/uL (ref 150.0–400.0)
RBC: 4.9 Mil/uL (ref 4.22–5.81)
RDW: 12.9 % (ref 11.5–15.5)
WBC: 7.9 10*3/uL (ref 4.0–10.5)

## 2023-01-25 LAB — LIPID PANEL
Cholesterol: 188 mg/dL (ref 0–200)
HDL: 78.9 mg/dL (ref >39.00)
LDL Cholesterol: 96 mg/dL (ref 0–99)
NonHDL: 108.88
Total CHOL/HDL Ratio: 2
Triglycerides: 63 mg/dL (ref 0.0–149.0)
VLDL: 12.6 mg/dL (ref 0.0–40.0)

## 2023-01-25 LAB — HEPATIC FUNCTION PANEL
ALT: 17 U/L (ref 0–53)
AST: 22 U/L (ref 0–37)
Albumin: 4.5 g/dL (ref 3.5–5.2)
Alkaline Phosphatase: 69 U/L (ref 39–117)
Bilirubin, Direct: 0.1 mg/dL (ref 0.0–0.3)
Total Bilirubin: 0.8 mg/dL (ref 0.2–1.2)
Total Protein: 6.9 g/dL (ref 6.0–8.3)

## 2023-01-25 LAB — BASIC METABOLIC PANEL
BUN: 12 mg/dL (ref 6–23)
CO2: 29 meq/L (ref 19–32)
Calcium: 9.4 mg/dL (ref 8.4–10.5)
Chloride: 103 meq/L (ref 96–112)
Creatinine, Ser: 1.11 mg/dL (ref 0.40–1.50)
GFR: 63.4 mL/min (ref >60.00)
Glucose, Bld: 95 mg/dL (ref 70–99)
Potassium: 4 meq/L (ref 3.5–5.1)
Sodium: 142 meq/L (ref 135–145)

## 2023-01-25 LAB — VITAMIN B12: Vitamin B-12: 259 pg/mL (ref 211–911)

## 2023-01-25 LAB — HEMOGLOBIN A1C: Hgb A1c MFr Bld: 5.6 % (ref 4.6–6.5)

## 2023-01-25 LAB — TSH: TSH: 1.65 u[IU]/mL (ref 0.35–5.50)

## 2023-01-25 LAB — PSA: PSA: 4.17 ng/mL — ABNORMAL HIGH (ref 0.10–4.00)

## 2023-01-25 MED ORDER — SERTRALINE HCL 50 MG PO TABS: 50.0000 mg | ORAL_TABLET | Freq: Every day | ORAL | 3 refills | Status: DC

## 2023-01-25 MED ORDER — TAMSULOSIN HCL 0.4 MG PO CAPS: 0.8000 mg | ORAL_CAPSULE | Freq: Every day | ORAL | 3 refills | Status: DC

## 2023-01-25 NOTE — Progress Notes (Signed)
Subjective:    Patient ID: Derek Cox, male    DOB: 21-Dec-1945, 77 y.o.   MRN: [id number]  HPI Here to follow up on issues. When I ask him today how he feels, he replies "like crap". Then when I ask him to tell me what he means by this, he can only say "I don't know". He cannot be more specific. He admits to feeling anxious all the time. He denies feeling depressed. He used to take Xanax at bedtime to help with sleep, but he stopped taking this a few months ago. He now has some trouble with sleep at times. His appetite is good. He admits to frequent falling, and in fact he fell yesterday at home. He says he did not pass out, yet he has no memory of why he fell. I asked if he hit the floor or a piece of furniture, and he cannot remember anything about it. He admits to memory problems in general. He had been seeing Dr. Naomie Dean for Parkinsonian symptoms, and he last saw her in May of last year. She wanted to get a DAT scan before she could diagnose him with Parkinson's disease, but this was never done. She also prescribed him Carbidopa-Levadopa, but her never picked up the prescription. I asked him why, and he answered that he did not trust her and that he wanted to see someone else. In fact, at his last visit with Dr. Lucia Gaskins, she had offered to refer him to the [facility], but he declined. His BP has been stable. He has a very slow urine stream, and he gets up 4-5 times at night to urinate. He is taking Flomax 0.4 mg daily, but this hasnot helped much.    Review of Systems  Constitutional:  Positive for fatigue.  HENT: Negative.    Eyes: Negative.   Respiratory: Negative.    Cardiovascular: Negative.   Gastrointestinal: Negative.   Genitourinary: Negative.   Musculoskeletal: Negative.   Skin: Negative.   Neurological: Negative.   Psychiatric/Behavioral:  Positive for agitation and sleep disturbance. Negative for behavioral problems, confusion, decreased concentration,  dysphoric mood, hallucinations, self-injury and suicidal ideas. The patient is nervous/anxious.        Objective:   Physical Exam Constitutional:      General: He is not in acute distress.    Appearance: Normal appearance. He is well-developed. He is not diaphoretic.  HENT:     Head: Normocephalic and atraumatic.     Right Ear: External ear normal.     Left Ear: External ear normal.     Nose: Nose normal.     Mouth/Throat:     Pharynx: No oropharyngeal exudate.  Eyes:     General: No scleral icterus.       Right eye: No discharge.        Left eye: No discharge.     Conjunctiva/sclera: Conjunctivae normal.     Pupils: Pupils are equal, round, and reactive to light.  Neck:     Thyroid: No thyromegaly.     Vascular: No JVD.     Trachea: No tracheal deviation.  Cardiovascular:     Rate and Rhythm: Normal rate and regular rhythm.     Pulses: Normal pulses.     Heart sounds: Normal heart sounds. No murmur heard.    No friction rub. No gallop.  Pulmonary:     Effort: Pulmonary effort is normal. No respiratory distress.     Breath sounds: Normal breath  sounds. No wheezing or rales.  Chest:     Chest wall: No tenderness.  Abdominal:     General: Bowel sounds are normal. There is no distension.     Palpations: Abdomen is soft. There is no mass.     Tenderness: There is no abdominal tenderness. There is no guarding or rebound.  Genitourinary:    Penis: Normal. No tenderness.      Testes: Normal.     Prostate: Normal.     Rectum: Normal. Guaiac result negative.  Musculoskeletal:        General: No tenderness. Normal range of motion.     Cervical back: Neck supple.  Lymphadenopathy:     Cervical: No cervical adenopathy.  Skin:    General: Skin is warm and dry.     Coloration: Skin is not pale.     Findings: No erythema or rash.  Neurological:     General: No focal deficit present.     Mental Status: He is alert and oriented to person, place, and time.     Cranial Nerves:  No cranial nerve deficit.     Motor: No abnormal muscle tone.     Coordination: Coordination normal.     Deep Tendon Reflexes: Reflexes are normal and symmetric. Reflexes normal.  Psychiatric:        Mood and Affect: Mood normal.        Behavior: Behavior normal.     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 We will refer him to [facility] to go to the Movement Disorders clinic for another opinion. For his anxiety, he will try Zoloft 50 mg daily. He will follow up with me in 3-4 weeks for this. For the BPH, we will increase the Flomax to 0.8 mg daily. Get fasting labs for lipids, etc. Set up another Cologuard test. We spent a total of (35   ) minutes reviewing records and discussing these issues.  Gershon Crane, MD

## 2023-01-26 NOTE — Addendum Note (Signed)
Addended by: Gershon Crane A on: 01/26/2023 12:14 PM   Modules accepted: Orders

## 2023-02-06 ENCOUNTER — Telehealth: Payer: Self-pay | Admitting: Family Medicine

## 2023-02-06 NOTE — Telephone Encounter (Signed)
FYI

## 2023-02-06 NOTE — Telephone Encounter (Signed)
He is scheduled to see [place] tomorrow

## 2023-02-06 NOTE — Telephone Encounter (Signed)
Call from [name] stated pt is in [place] and is very delusional ,can't walk also can't form his words I ask her did she try to see someone there but she stated no they didn't want to get stuck there so they are coming back  today and it will be at 5:00.

## 2023-02-07 ENCOUNTER — Ambulatory Visit (INDEPENDENT_AMBULATORY_CARE_PROVIDER_SITE_OTHER): Payer: Medicare Other | Admitting: Family Medicine

## 2023-02-07 ENCOUNTER — Encounter: Payer: Self-pay | Admitting: Family Medicine

## 2023-02-07 ENCOUNTER — Telehealth: Payer: Self-pay | Admitting: *Deleted

## 2023-02-07 VITALS — BP 118/78 | HR 84 | Temp 98.2°F | Wt 165.2 lb

## 2023-02-07 DIAGNOSIS — R270 Ataxia, unspecified: Secondary | ICD-10-CM

## 2023-02-07 DIAGNOSIS — G20C Parkinsonism, unspecified: Secondary | ICD-10-CM | POA: Diagnosis not present

## 2023-02-07 DIAGNOSIS — R410 Disorientation, unspecified: Secondary | ICD-10-CM

## 2023-02-07 NOTE — Telephone Encounter (Signed)
I spoke with the patient's wife.  We scheduled her for the next availability with Dr. Lucia Gaskins Monday, December 30 at 1130 on arrival 11:00.  The patient's wife would like for MRI brain to be ordered. She was appreciative. I told her we would call if there is a sooner opening.

## 2023-02-07 NOTE — Telephone Encounter (Deleted)
I spoke with the patient's wife.  We scheduled her for the next availability with Dr. Lucia Gaskins Monday, December 30 at 1130 on arrival 11:00.  The patient's wife would like for MRI brain to be ordered. She was appreciative. I told her we would call if there is a sooner opening.

## 2023-02-07 NOTE — Progress Notes (Signed)
   Subjective:    Patient ID: Derek Cox, male    DOB: [birth year], 77 y.o.   MRN: [id number]  HPI Here with his wife and daughter to discuss his recent confusion, hallucinations, and difficulty speaking and walking. We saw him 2 weeks ago for similar issues, and we got lab work which was unremarkable. We had talked about starting him on Zoloft for his anxiety, but he never got the RX filled. After that the family all travelled to [place] to visit his son, and during this trip the family says that Derek Cox could hardly walk, and he fell numerous times. He was also very confused, and he was crawling on the floor looking for "keys" or other objects that were not there. We spoke today about the fact that he was seeing Dr. Naomie Dean for Parkinsonian symptoms, and it sounds like she was proposing doing a DAT scan. Derek Cox has refused to do this scan, and he did not want to see Dr. Lucia Gaskins again.    Review of Systems  Constitutional:  Negative for diaphoresis and fever.  Respiratory: Negative.    Cardiovascular: Negative.   Gastrointestinal: Negative.   Genitourinary:  Positive for frequency. Negative for dysuria, hematuria and urgency.  Neurological:  Positive for tremors and speech difficulty. Negative for dizziness, seizures, facial asymmetry and headaches.  Psychiatric/Behavioral:  Positive for agitation, behavioral problems, confusion and hallucinations.        Objective:   Physical Exam Constitutional:      Comments: Unsteady on his feet  Cardiovascular:     Rate and Rhythm: Normal rate and regular rhythm.     Pulses: Normal pulses.     Heart sounds: Normal heart sounds.  Pulmonary:     Effort: Pulmonary effort is normal.     Breath sounds: Normal breath sounds.  Neurological:     Mental Status: He is alert. He is disoriented.     Coordination: Coordination abnormal.     Gait: Gait normal.  Psychiatric:        Mood and Affect: Mood normal.           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 I advised the family to follow up with Dr. Lucia Gaskins asap, and Derek Cox agreed. We will contact Dr. Lucia Gaskins and try to get him in to see her this week. We spent a total of ( 35  ) minutes reviewing records and discussing these issues.  Gershon Crane, MD

## 2023-02-07 NOTE — Telephone Encounter (Signed)
Pt called stating that he has not received an email he has been waiting for. Please advise.

## 2023-02-07 NOTE — Telephone Encounter (Signed)
Patients wife called in stating that he received a call from our office regarding setting up an appointment. I do not see anything documented.  Can you please advise if Dr Clent Ridges spoke with Dr Lucia Gaskins and if an appointment is needing to be set up?

## 2023-02-07 NOTE — Telephone Encounter (Addendum)
This was told to me from phone staff:  Received a call form Dr Clent Ridges yesterday.  Spoke to Dr. Lucia Gaskins.  Pt called following up.

## 2023-02-07 NOTE — Telephone Encounter (Signed)
See other phone note

## 2023-02-08 LAB — POC URINALSYSI DIPSTICK (AUTOMATED)
Bilirubin, UA: NEGATIVE
Blood, UA: NEGATIVE
Glucose, UA: NEGATIVE
Ketones, UA: NEGATIVE
Leukocytes, UA: NEGATIVE
Nitrite, UA: NEGATIVE
Protein, UA: NEGATIVE
Spec Grav, UA: 1.02 (ref 1.010–1.025)
Urobilinogen, UA: 0.2 U/dL
pH, UA: 5 (ref 5.0–8.0)

## 2023-02-08 NOTE — Addendum Note (Signed)
Addended by: Carola Rhine on: 02/08/2023 04:28 PM   Modules accepted: Orders

## 2023-02-09 ENCOUNTER — Other Ambulatory Visit: Payer: Self-pay | Admitting: Neurology

## 2023-02-09 DIAGNOSIS — F0390 Unspecified dementia without behavioral disturbance: Secondary | ICD-10-CM

## 2023-02-09 DIAGNOSIS — G309 Alzheimer's disease, unspecified: Secondary | ICD-10-CM

## 2023-02-09 DIAGNOSIS — R443 Hallucinations, unspecified: Secondary | ICD-10-CM

## 2023-02-09 DIAGNOSIS — G9340 Encephalopathy, unspecified: Secondary | ICD-10-CM

## 2023-02-09 NOTE — Telephone Encounter (Signed)
no auth required sent to [facility] [phone]

## 2023-02-15 ENCOUNTER — Ambulatory Visit
Admission: RE | Admit: 2023-02-15 | Discharge: 2023-02-15 | Disposition: A | Discharge status: Home / Self Care | Payer: Medicare Other | Source: Ambulatory Visit | Attending: Neurology | Admitting: Neurology

## 2023-02-15 DIAGNOSIS — F0390 Unspecified dementia without behavioral disturbance: Secondary | ICD-10-CM | POA: Diagnosis not present

## 2023-02-15 DIAGNOSIS — G9340 Encephalopathy, unspecified: Secondary | ICD-10-CM | POA: Diagnosis not present

## 2023-02-15 DIAGNOSIS — R443 Hallucinations, unspecified: Secondary | ICD-10-CM

## 2023-02-15 DIAGNOSIS — G309 Alzheimer's disease, unspecified: Secondary | ICD-10-CM

## 2023-02-15 MED ORDER — GADOPICLENOL 0.5 MMOL/ML IV SOLN
7.0000 mL | Freq: Once | INTRAVENOUS | Status: AC | PRN
  Administered 2023-02-15: 7 mL via INTRAVENOUS

## 2023-02-27 ENCOUNTER — Other Ambulatory Visit: Payer: Medicare Other

## 2023-03-03 ENCOUNTER — Other Ambulatory Visit: Payer: Self-pay | Admitting: Family Medicine

## 2023-03-20 ENCOUNTER — Telehealth: Payer: Self-pay | Admitting: Neurology

## 2023-03-20 NOTE — Telephone Encounter (Signed)
ERROR

## 2023-03-27 ENCOUNTER — Encounter: Payer: Self-pay | Admitting: Neurology

## 2023-03-27 ENCOUNTER — Ambulatory Visit: Payer: Medicare Other | Admitting: Neurology

## 2023-03-27 VITALS — BP 141/89 | HR 77 | Ht 70.0 in | Wt 160.0 lb

## 2023-03-27 DIAGNOSIS — F02B18 Dementia in other diseases classified elsewhere, moderate, with other behavioral disturbance: Secondary | ICD-10-CM

## 2023-03-27 DIAGNOSIS — G20A2 Parkinson's disease without dyskinesia, with fluctuations: Secondary | ICD-10-CM | POA: Diagnosis not present

## 2023-03-27 DIAGNOSIS — G20A1 Parkinson's disease without dyskinesia, without mention of fluctuations: Secondary | ICD-10-CM | POA: Diagnosis not present

## 2023-03-27 MED ORDER — DONEPEZIL HCL 5 MG PO TABS: 5.0000 mg | ORAL_TABLET | Freq: Every day | ORAL | 11 refills | Status: DC

## 2023-03-27 NOTE — Patient Instructions (Addendum)
Referral to Movement Disorder team at [facility] Can start Aricept for memory(Donepezil) Can consider Sinemet (Carbidopa/Levodopa) for movement problems Fall precautions Can try Zoloft for mood Refer to [facility] movement team Skin checks  Exercise and physical therapy MIND Diet [place] Healthy Brain Study  Donepezil Tablets What is this medication? DONEPEZIL (doe NEP e zil) treats memory loss and confusion (dementia) in people who have Alzheimer disease. It works by improving attention, memory, and the ability to engage in daily activities. It is not a cure for dementia or Alzheimer disease. This medicine may be used for other purposes; ask your health care provider or pharmacist if you have questions. COMMON BRAND NAME(S): Aricept What should I tell my care team before I take this medication? They need to know if you have any of these conditions: Asthma or other lung disease Difficulty passing urine Head injury Heart disease History of irregular heartbeat Liver disease Seizures (convulsions) Stomach or intestinal disease, ulcers or stomach bleeding An unusual or allergic reaction to donepezil, other medications, foods, dyes, or preservatives Pregnant or trying to get pregnant Breast-feeding How should I use this medication? Take this medication by mouth with a glass of water. Follow the directions on the prescription label. You may take this medication with or without food. Take this medication at regular intervals. This medication is usually taken before bedtime. Do not take it more often than directed. Continue to take your medication even if you feel better. Do not stop taking except on your care team's advice. If you are taking the 23 mg donepezil tablet, swallow it whole; do not cut, crush, or chew it. Talk to your care team about the use of this medication in children. Special care may be needed. Overdosage: If you think you have taken too much of this medicine contact  a poison control center or emergency room at once. NOTE: This medicine is only for you. Do not share this medicine with others. What if I miss a dose? If you miss a dose, take it as soon as you can. If it is almost time for your next dose, take only that dose, do not take double or extra doses. What may interact with this medication? Do not take this medication with any of the following: Certain medications for fungal infections like itraconazole, fluconazole, posaconazole, and voriconazole Cisapride Dextromethorphan; quinidine Dronedarone Pimozide Quinidine Thioridazine This medication may also interact with the following: Antihistamines for allergy, cough and cold Atropine Bethanechol Carbamazepine Certain medications for bladder problems like oxybutynin, tolterodine Certain medications for Parkinson's disease like benztropine, trihexyphenidyl Certain medications for stomach problems like dicyclomine, hyoscyamine Certain medications for travel sickness like scopolamine Dexamethasone Dofetilide Ipratropium NSAIDs, medications for pain and inflammation, like ibuprofen or naproxen Other medications for Alzheimer's disease Other medications that prolong the QT interval (cause an abnormal heart rhythm) Phenobarbital Phenytoin Rifampin, rifabutin or rifapentine Ziprasidone This list may not describe all possible interactions. Give your health care provider a list of all the medicines, herbs, non-prescription drugs, or dietary supplements you use. Also tell them if you smoke, drink alcohol, or use illegal drugs. Some items may interact with your medicine. What should I watch for while using this medication? Visit your care team for regular checks on your progress. Check with your care team if your symptoms do not get better or if they get worse. You may get drowsy or dizzy. Do not drive, use machinery, or do anything that needs mental alertness until you know how this medication affects  you. What side effects may I notice from receiving this medication? Side effects that you should report to your care team as soon as possible: Allergic reactions--skin rash, itching, hives, swelling of the face, lips, tongue, or throat Peptic ulcer--burning stomach pain, loss of appetite, bloating, burping, heartburn, nausea, vomiting Seizures Slow heartbeat--dizziness, feeling faint or lightheaded, confusion, trouble breathing, unusual weakness or fatigue Stomach bleeding--bloody or black, tar-like stools, vomiting blood or brown material that looks like coffee grounds Trouble passing urine Side effects that usually do not require medical attention (report these to your care team if they continue or are bothersome): Diarrhea Fatigue Loss of appetite Muscle pain or cramps Nausea Trouble sleeping This list may not describe all possible side effects. Call your doctor for medical advice about side effects. You may report side effects to FDA at 1-800-FDA-1088. Where should I keep my medication? Keep out of reach of children. Store at room temperature between 15 and 30 degrees C (59 and 86 degrees F). Throw away any unused medication after the expiration date. NOTE: This sheet is a summary. It may not cover all possible information. If you have questions about this medicine, talk to your doctor, pharmacist, or health care provider.  2024 Elsevier/Gold Standard (2020-10-28 00:00:00)  Parkinson's Disease Parkinson's disease causes problems with movement. It makes it harder for you to walk or control your body. Each person with the disease is affected differently. Treatments can help you manage your symptoms. Parkinson's disease can range from mild to very bad, but it gets worse over time. This often happens slowly over many years. What are the causes? Parkinson's disease is caused by a loss of brain cells called neurons. These brain cells make a substance called dopamine, which is needed to  control body movement. It's not known what causes the brain cells to die. What increases the risk? Being male. Being 55 years of age or older. Having a family history of Parkinson's disease. Having an injury to the brain in the past. Being around things that are harmful or poisonous, such as pesticides. Having depression. This is when you feel sad or hopeless. What are the signs or symptoms? Symptoms can vary and get worse over time. The main symptoms can be seen in your movement. These include: Shaking or tremors that you can't control. This happens while you're resting. Stiffness in your arms and legs. Losing facial expressions. Walking in a way that isn't normal. You may walk with short, shuffling steps. Loss of balance when standing. You may sway, fall backward, or have trouble making turns. Other symptoms include: Being very sad, worried, or nervous. Having delusions. These are strong beliefs that aren't true. Difficulty speaking.  Having hallucinations. This is when you see, hear, taste, smell, or feel things that aren't real. Trouble speaking or swallowing. Trouble pooping (constipation). Needing to pee (urinate) right away, peeing often, or losing control of when you pee or poop. Sleep problems. How is this diagnosed? A diagnosis may be made based on symptoms, your medical history, and a physical exam. You may also have imaging tests that make pictures of your brain. How is this treated? There is no cure for Parkinson's disease. The goal of treatment is to manage your symptoms. It may include: Medicines. Therapy to help with talking or movement. Surgery to reduce shaking and other movements that you can't control. Follow these instructions at home: Medicines Take your medicines only as told by your health care provider. Avoid taking pain or sleeping medicines. These can  affect your thinking. Activity Ask your provider if it's safe for you to drive. Exercise as told by  your provider or physical therapist. Lifestyle  Put grab bars and railings in your home, especially near the toilet and in the shower. These help prevent falls. Do not smoke, vape, or use products with nicotine or tobacco in them. If you need help quitting, talk with your provider. Do not drink alcohol. General instructions Talk with your provider to find out what kind of help you need at home. Ask about ways to stay safe. Join a support group for people with Parkinson's disease. Where to find more information [employer] Stroke: [employer] Parkinson's Foundation: parkinson.org Contact a health care provider if: Medicines don't help your symptoms. You have a lot of side effects from your medicines. You keep losing your balance or you fall. You need more help at home. You have trouble swallowing. You have a very hard time pooping. You feel very sad, worried, or confused. You see, hear, taste, smell, or feel things that aren't real. Get help right away if: You were hurt in a fall. You can't swallow without choking. You have chest pain or trouble breathing. You don't feel safe at home. These symptoms may be an emergency. Call 911 right away. Do not wait to see if the symptoms will go away. Do not drive yourself to the hospital. Also, get help right away if: You feel like you may hurt yourself or others. You have thoughts about taking your own life. Take one of these steps: Go to your nearest emergency room. Call 911. Call the National Suicide Prevention Lifeline at (269)785-7536 or 988. Text the Crisis Text Line at (250)382-3153. This information is not intended to replace advice given to you by your health care provider. Make sure you discuss any questions you have with your health care provider. Document Revised: 05/30/2022 Document Reviewed: 05/30/2022 Elsevier Patient Education  2024 [employer].

## 2023-03-27 NOTE — Progress Notes (Unsigned)
[facility]  Hypomimia Cogwheeling'increased tone x 4 Low clearanc Narrow gait Enbloc turning'slightly stooped Decreased arm swing   Provider:  Dr Lucia Gaskins Requesting Provider: Nelwyn Salisbury, MD Primary Care Provider:  Nelwyn Salisbury, MD  CC:  Parkinsonism  Patient declined DaTscan, we ordered and he said he did not want to have it.  We did order formal neurocognitive testing in the past as well does not appear as though he had it completed. HE is having trouble forming sentences. Balance is off, falling. Stumbling outside and inside. Dififculty dressing. Difficulty reasoning eith him, appetite Is good, difficulty following directions.   07/28/2021: Here with wife today.  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  MRI of the cervical spine without any high-grade cervical spinal stenosis but prior ACDF.  We discussed his findings on examination which are very parkinsonian, patient appears to be having memory problems although he denies it, he is having more falls. He did go to physical therapy but he stopped going. If he thnks about he can lift his legs up.  Today we had an extended visit talking about further options for evaluation.  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  Personally reviewed images of the brain and cervical spine and agree with the following:  MRI brain 05/17/2021: IMPRESSION: This MRI of the brain with and without contrast shows the following: 1.   No acute findings.  Normal enhancement pattern. 2.   Sequela of remote left frontal hemorrhage and surgery with subsequent encephalomalacia. 3.   Mild generalized cortical atrophy and mild ventriculomegaly. 4.   Scattered T2/FLAIR hyperintense foci in the hemispheres consistent with mild chronic microvascular ischemic change. MRI C-Spine: 1.   The spinal  cord appears normal.   2.   Previous fusion from C4-C7.  The C4-C5 fusion has occurred since the 06/10/2012 MRI.  There continues to be moderate foraminal narrowing to the right at C6-C7, unchanged compared to the previous MRI.  No spinal stenosis at these levels. 3.   At C3-C4, there are new degenerative changes not noted on the 2014 MRI.  There is 2 mm retrolisthesis, mild right facet hypertrophy and ligamenta flava hypertrophy.  This causes mild spinal stenosis and moderate right foraminal narrowing.  There does not appear to be nerve root compression.  Patient complains of symptoms per HPI as well as the following symptoms: falls . Pertinent negatives and positives per HPI. All others negative   HPI:  Derek Cox. is a 77 y.o. male here as requested by Nelwyn Salisbury, MD for Trigeminal neuralgia(he gives a different history and location more occipital) started on gabapentin from his primary care.   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    His wife is here and provides much information, the pain starts in the back of the left ear brief and shoots up, severe like a lightning. He has had 2 neck surgeries and 3 low back surgeries. He has had shots with Dr. Ethelene Hal in the past. He started getting pain the back of the head on the left encompassing the whole ear. It has tapered off. He is better as far as occipital neuralgia. He doesn't take the gabapentin, it happened last  a couple days ago, severe, brief, are radiates to the left side of the head. Not into the temple or around the eye. Verified with wife and patient not into the temple. No vision changes. He is a patient of Dr. Lovell Sheehan, he does not want to return to Dr. Ethelene Hal, I encouraged him to talk to Dr. Lovell Sheehan about further pain management in their office they  can perform injections and have a physician who does this. The headache pain is new. No tremor at rest. He has some tremor with action.He has been falling, fallen 3 times in the last week, his handwriting. Slow walking has become worse over the years, not as a result of his stroke. Patient says his memory is not impaired. Wife also says his memory is not impaired past what would be expected for age. Also naps many days, falls asleep sititn gup, no snoring, very fatigued. No other focal neurologic deficits, associated symptoms, inciting events or modifiable factors.   Reviewed notes, labs and imaging from outside physicians, which showed:   I reviewed Dr. Claris Che notes, about 3 weeks ago he began to have intermittent sharp electric pains in the left temple area, these last about 10 seconds at a time, no vision changes, neurologic deficits.  He was started on gabapentin.  Review of Systems: Patient complains of symptoms per HPI as well as the following symptoms neck and back pain. Pertinent negatives and positives per HPI. All others negative.   Social History   Socioeconomic History   Marital status: Married    Spouse name: Not on file   Number of children: Not on file   Years of education: Not on file   Highest education level: Not on file  Occupational History   Not on file  Tobacco Use   Smoking status: Former    Current packs/day: 0.00    Average packs/day: 0.5 packs/day for 20.0 years (10.0 ttl pk-yrs)    Types: Cigarettes    Start date: 08/08/1976    Quit date: 08/08/1996    Years since quitting: 26.6   Smokeless tobacco: Never  Vaping Use   Vaping status: Never Used  Substance and Sexual Activity   Alcohol use: Yes    Alcohol/week: 21.0 standard drinks of alcohol    Types: 21 Glasses of wine per week    Comment: 2-3 glasses of wine each night   Drug use: No   Sexual activity: Yes  Other Topics Concern   Not on file  Social History Narrative   Lives at home with wife    Right handed   Caffeine: partial cup of coffee in the morning, maybe a glass of tea at night    Social Drivers of Health   Financial Resource Strain: Low Risk  (04/12/2021)   Overall Financial Resource Strain (CARDIA)    Difficulty of Paying Living Expenses: Not hard at all  Food Insecurity: No Food Insecurity (04/12/2021)   Hunger Vital Sign    Worried About Running Out of Food in the Last Year: Never true    Ran Out of Food in the Last Year: Never true  Transportation Needs: No Transportation Needs (04/12/2021)   PRAPARE - Administrator, Civil Service (Medical): No    Lack of Transportation (Non-Medical): No  Physical Activity: Inactive (04/12/2021)   Exercise Vital Sign    Days of Exercise per Week: 0 days    Minutes of Exercise per Session: 0 min  Stress: No Stress Concern Present (04/12/2021)  [employer] of Occupational Health - Occupational Stress Questionnaire    Feeling of Stress : Not at all  Social Connections: Not on file  Intimate Partner Violence: Not on file    Family History  Problem Relation Age of Onset   Stroke Father    Lung cancer Father    Anesthesia problems Neg Hx    Parkinsonism Neg Hx     Past Medical History:  Diagnosis Date   Arthritis    Left hand   BPH with urinary obstruction    sees Dr. 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, no CPAP   Stroke (HCC)    2010, treated at [facility] for evacuation of [facility], then rehabed at [facility], no residual deficits     Patient Active Problem List   Diagnosis Date Noted   Parkinsonism (HCC) 07/28/2021   Trigeminal neuralgia of  left side of face 02/09/2021   Spondylolisthesis of lumbar region 11/06/2019   Pain in right foot 08/14/2018   Erectile dysfunction 09/13/2017   Degeneration of lumbar intervertebral disc 06/22/2017   Low back pain 04/12/2017   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   HTN (hypertension) 12/02/2014   Hyperlipidemia 12/02/2014   Hx of completed stroke 12/02/2014   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/2014   Insomnia 12/02/2014   Hx of seizure disorder 12/02/2014    Past Surgical History:  Procedure Laterality Date   ANTERIOR CERVICAL DECOMP/DISCECTOMY FUSION N/A 11/15/2012   Procedure: ANTERIOR CERVICAL DECOMPRESSION/DISCECTOMY FUSION 1 LEVEL,CERVICAL FOUR-FIVE;  Surgeon: Cristi Loron, MD;  Location: [facility];  Service: Neurosurgery;  Laterality: N/A;   BRAIN HEMATOMA EVACUATION Left 2010   BRAIN SURGERY     bleeding on brain   CERVICAL FUSION  2007   2 surgeries   COLONOSCOPY  2006   per Dr. Barnett Abu, clear. Negative Cologuard 04-28-17     CYSTOSCOPY WITH INSERTION OF UROLIFT N/A 07/25/2016   Procedure: CYSTOSCOPY WITH INSERTION OF UROLIFT x4;  Surgeon: Marcine Matar, MD;  Location: [facility];  Service: Urology;  Laterality: N/A;   HERNIA REPAIR Bilateral    inguinal hernia - repair, [facility]- 20 yrs ago     [facility] evacuation  2010   INGUINAL HERNIA REPAIR  08/11/2011   Procedure: LAPAROSCOPIC INGUINAL HERNIA;  Surgeon: Lodema Pilot, DO;  Location: [facility] OR;  Service: General;  Laterality: Right;   INGUINAL HERNIA REPAIR     KNEE SURGERY     MOLE REMOVAL     non melanoma   PILONIDAL CYST EXCISION     TONSILLECTOMY      Current Outpatient Medications  Medication Sig Dispense Refill   levETIRAcetam (KEPPRA) 750 MG tablet Take 1 tablet (750 mg total) by mouth 2 (two) times daily. 180 tablet 3   lisinopril (ZESTRIL) 20 MG tablet TAKE 1 TABLET EVERY DAY 90 tablet 3   rosuvastatin (CRESTOR) 5 MG tablet TAKE 1 TABLET EVERY DAY 90 tablet 3   tamsulosin (FLOMAX) 0.4 MG CAPS capsule Take  2 capsules (  0.8 mg total) by mouth daily. 180 capsule 3   No current facility-administered medications for this visit.    Allergies as of 03/27/2023 - Review Complete 03/27/2023  Allergen Reaction Noted   Dilantin [phenytoin] Other (See Comments) 08/09/2011    Vitals: BP (!) 141/89 (Cuff Size: Normal)   Pulse 77   Ht 5\' 10"  (1.778 m)   Wt 160 lb (72.6 kg)   BMI 22.96 kg/m  Last Weight:  Wt Readings from Last 1 Encounters:  03/27/23 160 lb (72.6 kg)   Last Height:   Ht Readings from Last 1 Encounters:  03/27/23 5\' 10"  (1.778 m)     Physical exam: stable Exam: Gen: NAD, conversant, well nourised, well groomed                     CV: RRR, no MRG. No Carotid Bruits. No peripheral edema, warm, nontender Eyes: Conjunctivae clear without exudates or hemorrhage  Neuro: stable Detailed Neurologic Exam  Speech:    Speech is normal; fluent and spontaneous with normal comprehension.  Cognition:    The patient is oriented to person, place, and time;     recent and remote memory impaired;     language fluent;     Impaired attention, concentration Cranial Nerves: hypomimia    The pupils are equal, round, and reactive to light. Pupils too small to visualize fundi. Visual fields are impaired to finger confrontation, but difficult exam due to what appears to be cognitive problems. Impaired upgaze, otherwise extraocular movements are intact. Trigeminal sensation is intact and the muscles of mastication are normal. The face is symmetric. The palate elevates in the midline. Hearing impaired. Voice is normal. Shoulder shrug is normal. The tongue has normal motion without fasciculations.   Coordination:    Can touch finger to nose with a little bit more difficulty on the left. Can perform heel to shin.   Gait:     Slightly stooped, decreased arm swing, not classically shuffling but low clearance and narrow, imbalance  Motor Observation:    No asymmetry, no atrophy, slight action tremor  on FTN Tone:   Increased right arm     Strength: right triceps 4/5. Slightly weaker right grip. Right hip flexion 3+/5, right leg flexion 3+/5. Otherwise strength is V/V in the upper and lower limbs.      Sensation: intact to LT     Reflex Exam:  DTR's:   Absent lowers, trace uppers Toes:    Right down, left up.  Clonus:    Clonus is absent.    Assessment/Plan:  77 y.o. male here as requested by Nelwyn Salisbury, MD for Trigeminal neuralgia(he gives a different history today and location more occipital and temporo-parietal not strictly temporal) started on gabapentin from his primary care. He is more worried however about his gait and falls.  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    Gait abnormalty: Falls, hypomimia, small handwriting, stooped posture, low clearance and narrow base, multiple fally, bradykinesia, no smell sensation.  MRI of the brain showed remote left frontal encephalomalacia from hemorrhage, MRI of the cervical spine did not show any current etiology prior surgery, we had an extended visit today talking about his symptoms as above, I do think patient has a parkinsonian disorder, but they would like further evaluation and giving his con founding other conditions  as above including low back pain, I will order a DaTscan.  Options discussed, they would like all of these ordered Start Sinemet(Carbidopa/Levodopa) DAT Scan Formal memory testing for a baseline, patient denies but I do think he has cognitive dysfunction asked the same questions over today, extended visit to try and explain findings. His wife is knowledgeable and lovely. Consider 2nd opinion [facility] after workup if they want, not clear to me whether this is idiopathic Parkinson's disease or if he  possibly has progressive supranuclear palsy (PSP).  Unfortunately at DaTscan will only confirm a parkinsonian disorder but will not differentiate between idiopathic Parkinson's disease or PSP. Follow-up after DaTscan and formal memory testing completed.   Occipital neuralgia: Improving, discussed etiology, he has had multiple neck and back surgeries, discussed conservative measures. He is a patient of Dr. Lovell Sheehan, he does not want to return to Dr. Ethelene Hal, I encouraged him to talk to Dr. Lovell Sheehan about further pain management in their office they can perform injections and have a physician who does this.   No orders of the defined types were placed in this encounter.   No orders of the defined types were placed in this encounter.    Cc: Nelwyn Salisbury, MD,  Dr. Philemon Kingdom, MD  [facility] [address] [place], [place] [address]  Phone [phone] Fax [phone]  I spent over 70 minutes of face-to-face and non-face-to-face time with patient on the  No diagnosis found.   diagnosis.  This included previsit chart review, lab review, study review, order entry, electronic health record documentation, patient education on the different diagnostic and therapeutic options, counseling and coordination of care, risks and benefits of management, compliance, or risk factor reduction

## 2023-03-30 ENCOUNTER — Other Ambulatory Visit: Payer: BLUE CROSS/BLUE SHIELD

## 2023-03-30 ENCOUNTER — Telehealth: Payer: Self-pay | Admitting: Neurology

## 2023-03-30 NOTE — Telephone Encounter (Signed)
Referral for neurology fax to [facility]. Phone: [phone], Fax: [phone].

## 2023-04-13 ENCOUNTER — Telehealth: Payer: Self-pay

## 2023-04-13 NOTE — Telephone Encounter (Signed)
Pt called back and I relayed that if was having issues with sleep taking it in the evening then per Dr. Lucia Gaskins can take it in the am and see if he is able to sleep at night.  This will not improve memory but will slow down the process of memory loss.  He verbalized understanding, and thanked me for calling.

## 2023-04-13 NOTE — Telephone Encounter (Signed)
He can take it in the morning. Let him know that this medications slows down memory loss but does not improve it. So he may not see improvement but it may help slow down decline. Thank you

## 2023-04-13 NOTE — Telephone Encounter (Signed)
Patient called in and advised that he is unable to tolerate the  donepezil (ARICEPT) 5 MG tablet prescribed by Dr Lucia Gaskins at his last visit. He states that he is unable to sleep while taking it. He states he has been on it for at least a week, and is not seeing any benefit from it. I tried getting clarification on what exactly is going on and he stated that he is unable to fall asleep since starting on it and when he does fall asleep, he is unable to stay asleep.  He states that he has not had a good nights sleep since starting the medication and would like a call back to discuss options.

## 2023-04-13 NOTE — Telephone Encounter (Signed)
LMVM for pt that returning call.  

## 2023-05-24 ENCOUNTER — Other Ambulatory Visit: Payer: Self-pay | Admitting: Family Medicine

## 2023-05-24 MED ORDER — LEVETIRACETAM 750 MG PO TABS: 750.0000 mg | ORAL_TABLET | Freq: Two times a day (BID) | ORAL | 0 refills | Status: DC

## 2023-05-24 NOTE — Telephone Encounter (Signed)
 Copied from CRM [id number]. Topic: Clinical - Medication Refill >> May 24, 2023 12:21 PM Sonny Dandy B wrote: Most Recent Primary Care Visit:  Provider: Gershon Crane A  Department: [facility]  Visit Type: OFFICE VISIT  Date: 02/07/2023  Medication: levETIRAcetam (KEPPRA) 750 MG tablet   Has the patient contacted their pharmacy? Yes (Agent: If no, request that the patient contact the pharmacy for the refill. If patient does not wish to contact the pharmacy document the reason why and proceed with request.) (Agent: If yes, when and what did the pharmacy advise?)  Is this the correct pharmacy for this prescription? Yes If no, delete pharmacy and type the correct one.  This is the patient's preferred pharmacy:  [id number] [place], [place] - [address] [name] [address] Phone: [phone] Fax: [phone]     Has the prescription been filled recently? Yes  Is the patient out of the medication? Yes  Has the patient been seen for an appointment in the last year OR does the patient have an upcoming appointment? Yes  Can we respond through MyChart? Yes  Agent: Please be advised that Rx refills may take up to 3 business days. We ask that you follow-up with your pharmacy.

## 2023-06-20 DIAGNOSIS — Z85828 Personal history of other malignant neoplasm of skin: Secondary | ICD-10-CM | POA: Diagnosis not present

## 2023-06-20 DIAGNOSIS — D225 Melanocytic nevi of trunk: Secondary | ICD-10-CM | POA: Diagnosis not present

## 2023-06-20 DIAGNOSIS — L820 Inflamed seborrheic keratosis: Secondary | ICD-10-CM | POA: Diagnosis not present

## 2023-06-20 DIAGNOSIS — L570 Actinic keratosis: Secondary | ICD-10-CM | POA: Diagnosis not present

## 2023-06-20 DIAGNOSIS — L821 Other seborrheic keratosis: Secondary | ICD-10-CM | POA: Diagnosis not present

## 2023-06-20 DIAGNOSIS — L814 Other melanin hyperpigmentation: Secondary | ICD-10-CM | POA: Diagnosis not present

## 2023-07-21 ENCOUNTER — Telehealth: Payer: Self-pay | Admitting: Family Medicine

## 2023-07-21 DIAGNOSIS — R4189 Other symptoms and signs involving cognitive functions and awareness: Secondary | ICD-10-CM

## 2023-07-21 DIAGNOSIS — G20C Parkinsonism, unspecified: Secondary | ICD-10-CM

## 2023-07-21 NOTE — Telephone Encounter (Signed)
 Copied from CRM [id number]. Topic: Referral - Question >> Jul 20, 2023  3:43 PM [name] E wrote: Reason for CRM: Patient's wife, Kristeen Peto, called questioning if the patient's neurology referral can get sent to Dr. Elby Green instead. Wife stated that one of their friends goes to him and has heard great things. Dr. Cipriano Creeks is with [facility]. Callback number for wife is [phone] to discuss.

## 2023-07-25 DIAGNOSIS — G20A1 Parkinson's disease without dyskinesia, without mention of fluctuations: Secondary | ICD-10-CM | POA: Insufficient documentation

## 2023-07-25 DIAGNOSIS — R4189 Other symptoms and signs involving cognitive functions and awareness: Secondary | ICD-10-CM | POA: Insufficient documentation

## 2023-07-25 NOTE — Addendum Note (Signed)
 Addended by: Corita Diego A on: 07/25/2023 08:05 AM   Modules accepted: Orders

## 2023-07-25 NOTE — Telephone Encounter (Signed)
 I did the referral to Dr. Levie Ream in [place]

## 2023-07-28 DIAGNOSIS — R419 Unspecified symptoms and signs involving cognitive functions and awareness: Secondary | ICD-10-CM | POA: Diagnosis not present

## 2023-07-28 DIAGNOSIS — Z1330 Encounter for screening examination for mental health and behavioral disorders, unspecified: Secondary | ICD-10-CM | POA: Diagnosis not present

## 2023-08-01 NOTE — Telephone Encounter (Signed)
 Spoke with pt wife stated that they already had appointment with Dr Cipriano Creeks as referred by Dr Alyne Babinski, stated that Dr Cipriano Creeks ordered some further testings

## 2023-08-26 ENCOUNTER — Other Ambulatory Visit: Payer: Self-pay | Admitting: Family Medicine

## 2023-09-14 ENCOUNTER — Ambulatory Visit: Payer: Self-pay

## 2023-09-14 NOTE — Telephone Encounter (Signed)
 Copied from CRM [id number]. Topic: Clinical - Red Word Triage >> Sep 14, 2023 11:40 AM Chuck Crater wrote: Red Word that prompted transfer to Nurse Triage: Patient fell in the night and Wife found him on the floor cold this morning. Patient wife is requesting medication for gout. He has something on his toe that it bright red and very painful.   Reason for Disposition  [1] Unable to get up until help (e.g., caregiver, family, friend) arrived AND [2] on the ground 1 hour or more  Answer Assessment - Initial Assessment Questions 1. MECHANISM: How did the fall happen?     Unsure , patient found on floor by wife cold and naked but he did not remember the fall and was unsure how long we was on the floor for 2. DOMESTIC VIOLENCE AND ELDER ABUSE SCREENING: Did you fall because someone pushed you or tried to hurt you? If Yes, ask: Are you safe now?     No 3. ONSET: When did the fall happen? (e.g., minutes, hours, or days ago)     Sometime overnight  4. LOCATION: What part of the body hit the ground? (e.g., back, buttocks, head, hips, knees, hands, head, stomach)     Unsure  5. INJURY: Did you hurt (injure) yourself when you fell? If Yes, ask: What did you injure? Tell me more about this? (e.g., body area; type of injury; pain severity)     No 6. PAIN: Is there any pain? If Yes, ask: How bad is the pain? (e.g., Scale 1-10; or mild,  moderate, severe)   - NONE (0): No pain   - MILD (1-3): Doesn't interfere with normal activities    - MODERATE (4-7): Interferes with normal activities or awakens from sleep    - SEVERE (8-10): Excruciating pain, unable to do any normal activities      Pain in his right big toe, history of gout  7. SIZE: For cuts, bruises, or swelling, ask: How large is it? (e.g., inches or centimeters)      N/A 9. OTHER SYMPTOMS: Do you have any other symptoms? (e.g., dizziness, fever, weakness; new onset or worsening).      Difficulty walking, various wounds on  body that are not new 10. CAUSE: What do you think caused the fall (or falling)? (e.g., tripped, dizzy spell)       Unsure, fall was not witnessed and patient does not remember    Advised patient's wife to take the patient to the ED but she states he may be unwilling to go. She is also requesting a prescription for gout if possible.  Protocols used: Falls and Falling-A-AH   FYI Only or Action Required?: Action required by provider: medication refill request. Patient's wife requesting something for his gout  Patient was last seen in primary care on 02/07/2023 by Donley Furth, MD. Called Nurse Triage reporting Fall. Symptoms began sometime last night. Interventions attempted: Rest, hydration, or home remedies. Symptoms are: unchanged.  Triage Disposition: Go to ED or PCP/Alternative with Approval  Patient/caregiver understands and will follow disposition?: Yes

## 2023-09-15 ENCOUNTER — Ambulatory Visit: Payer: Self-pay

## 2023-09-15 ENCOUNTER — Other Ambulatory Visit: Payer: Self-pay

## 2023-09-15 MED ORDER — CIPROFLOXACIN HCL 500 MG PO TABS: 500.0000 mg | ORAL_TABLET | Freq: Two times a day (BID) | ORAL | 0 refills | Status: AC

## 2023-09-15 NOTE — Telephone Encounter (Signed)
 Rx sent to pt pharmacy. Pt Spouse notified

## 2023-09-15 NOTE — Telephone Encounter (Signed)
  FYI Only or Action Required?: FYI only for provider.  Patient was last seen in primary care on 02/07/2023 by Donley Furth, MD. Called Nurse Triage reporting Urinary Frequency. Symptoms began yesterday. Interventions attempted: Nothing. Symptoms are: gradually worsening.  Triage Disposition: See Physician Within 24 Hours going to UC; no apt available.   Patient/caregiver understands and will follow disposition?: Yes    Copied from CRM [id number]. Topic: Clinical - Red Word Triage >> Sep 15, 2023 12:10 PM Shardie S wrote: [facility] that prompted transfer to Nurse Triage: fall- bruising and swelling, frequent urination, confusion Reason for Disposition  Urinating more frequently than usual (i.e., frequency)  Answer Assessment - Initial Assessment Questions 1. SYMPTOM: What's the main symptom you're concerned about? (e.g., frequency, incontinence)     Frequent urination, hx dementia 2. ONSET: When did the  frequency  start?     Weeks ago 3. PAIN: Is there any pain? If Yes, ask: How bad is it? (Scale: 1-10; mild, moderate, severe)     no 4. CAUSE: What do you think is causing the symptoms?     UTI 5. OTHER SYMPTOMS: Do you have any other symptoms? (e.g., blood in urine, fever, flank pain, pain with urination)     Fell 5 times yesterday 6. PREGNANCY: Is there any chance you are pregnant? When was your last menstrual period?     na  Protocols used: Urinary Symptoms-A-AH

## 2023-09-15 NOTE — Telephone Encounter (Signed)
Call in Cipro 500 mg BID for 7 days  

## 2023-09-20 DIAGNOSIS — R300 Dysuria: Secondary | ICD-10-CM | POA: Diagnosis not present

## 2023-09-20 DIAGNOSIS — M25551 Pain in right hip: Secondary | ICD-10-CM | POA: Diagnosis not present

## 2023-09-20 DIAGNOSIS — M25552 Pain in left hip: Secondary | ICD-10-CM | POA: Diagnosis not present

## 2023-09-22 ENCOUNTER — Ambulatory Visit: Payer: Self-pay

## 2023-09-22 NOTE — Telephone Encounter (Signed)
 FYI Only or Action Required?: FYI only for provider.  Patient was last seen in primary care on 02/07/2023 by [name], MD. Called Nurse Triage reporting Leg Swelling. Symptoms began several days ago. Interventions attempted: Nothing. Symptoms are: stable.  Triage Disposition: See HCP Within 4 Hours (Or PCP Triage) Referred to UC, declined. See note.   Patient/caregiver understands and will follow disposition?: Yes         Copied from CRM [id number]. Topic: Clinical - Red Word Triage >> Sep 22, 2023  3:10 PM Ernestene P wrote: [facility] that prompted transfer to Nurse Triage: fallen a lot, leg is swollen from knee down   ----------------------------------------------------------------------- From previous Reason for Contact - Scheduling: Patient/patient representative is calling to schedule an appointment. Refer to attachments for appointment information. Reason for Disposition  [1] Thigh, calf, or ankle swelling AND [2] only 1 side  Answer Assessment - Initial Assessment Questions 1. ONSET: When did the swelling start? (e.g., minutes, hours, days)     --------------1 week     2. LOCATION: What part of the leg is swollen?  Are both legs swollen or just one leg?     ----------------R. Leg  (calves to toes)    3. SEVERITY: How bad is the swelling? (e.g., localized; mild, moderate, severe)   - Localized: Small area of swelling localized to one leg.   - MILD pedal edema: Swelling limited to foot and ankle, pitting edema < 1/4 inch (6 mm) deep, rest and elevation eliminate most or all swelling.   - MODERATE edema: Swelling of lower leg to knee, pitting edema > 1/4 inch (6 mm) deep, rest and elevation only partially reduce swelling.   - SEVERE edema: Swelling extends above knee, facial or hand swelling present.        --------------------------- Moderate/ Severe- Pitting noted.    4. REDNESS: Does the swelling look red or infected?     --------------------------  Denies    5. PAIN: Is the swelling painful to touch? If Yes, ask: How painful is it?   (Scale 1-10; mild, moderate or severe)     ------------------------------- Denies    6. FEVER: Do you have a fever? If Yes, ask: What is it, how was it measured, and when did it start?      ---------------------------------Denies    7. CAUSE: What do you think is causing the leg swelling?     ---------------------------------Unsure     8. MEDICAL HISTORY: Do you have a history of blood clots (e.g., DVT), cancer, heart failure, kidney disease, or liver failure?     -----------------------------Denies    9. RECURRENT SYMPTOM: Have you had leg swelling before? If Yes, ask: When was the last time? What happened that time?     ------------------------------ Denies    10. OTHER SYMPTOMS: Do you have any other symptoms? (e.g., chest pain, difficulty breathing)       -----------------------------Denies    Additional Information:   Referred to local UC-----pts wife declined and preferred appt instead. Reiterated the importance and concern with pt s/s.  Appointment scheduled for patient appropriately-06/30   Patient educated on pertinent s/s that would warrant emergent help/call 911/ ED/UC.  Patient verbalized understanding and agrees with plan No additional questions/concerns noted during the time of the call.   Multiple Bruises/ falls- unsteady gait--- Vannie--- 66month ago  Falll:known by PCP?  Protocols used: Leg Swelling and Edema-A-AH

## 2023-09-23 ENCOUNTER — Emergency Department (HOSPITAL_COMMUNITY)
Admission: EM | Admit: 2023-09-23 | Discharge: 2023-09-23 | Disposition: A | Discharge status: Home / Self Care | Attending: Emergency Medicine | Admitting: Emergency Medicine

## 2023-09-23 ENCOUNTER — Emergency Department (HOSPITAL_COMMUNITY)

## 2023-09-23 ENCOUNTER — Encounter (HOSPITAL_COMMUNITY): Payer: Self-pay | Admitting: *Deleted

## 2023-09-23 ENCOUNTER — Other Ambulatory Visit: Payer: Self-pay

## 2023-09-23 DIAGNOSIS — R0989 Other specified symptoms and signs involving the circulatory and respiratory systems: Secondary | ICD-10-CM | POA: Diagnosis not present

## 2023-09-23 DIAGNOSIS — R079 Chest pain, unspecified: Secondary | ICD-10-CM | POA: Diagnosis not present

## 2023-09-23 DIAGNOSIS — M545 Low back pain, unspecified: Secondary | ICD-10-CM | POA: Diagnosis not present

## 2023-09-23 DIAGNOSIS — R4182 Altered mental status, unspecified: Secondary | ICD-10-CM | POA: Diagnosis not present

## 2023-09-23 DIAGNOSIS — R296 Repeated falls: Secondary | ICD-10-CM

## 2023-09-23 DIAGNOSIS — R9082 White matter disease, unspecified: Secondary | ICD-10-CM | POA: Diagnosis not present

## 2023-09-23 DIAGNOSIS — Z79899 Other long term (current) drug therapy: Secondary | ICD-10-CM | POA: Diagnosis not present

## 2023-09-23 DIAGNOSIS — S301XXA Contusion of abdominal wall, initial encounter: Secondary | ICD-10-CM | POA: Insufficient documentation

## 2023-09-23 DIAGNOSIS — I700 Atherosclerosis of aorta: Secondary | ICD-10-CM | POA: Diagnosis not present

## 2023-09-23 DIAGNOSIS — Z981 Arthrodesis status: Secondary | ICD-10-CM | POA: Diagnosis not present

## 2023-09-23 DIAGNOSIS — R609 Edema, unspecified: Secondary | ICD-10-CM | POA: Diagnosis not present

## 2023-09-23 DIAGNOSIS — M79631 Pain in right forearm: Secondary | ICD-10-CM | POA: Diagnosis not present

## 2023-09-23 DIAGNOSIS — R531 Weakness: Secondary | ICD-10-CM | POA: Diagnosis not present

## 2023-09-23 DIAGNOSIS — Z043 Encounter for examination and observation following other accident: Secondary | ICD-10-CM | POA: Diagnosis not present

## 2023-09-23 DIAGNOSIS — S51811A Laceration without foreign body of right forearm, initial encounter: Secondary | ICD-10-CM | POA: Diagnosis not present

## 2023-09-23 DIAGNOSIS — R519 Headache, unspecified: Secondary | ICD-10-CM | POA: Diagnosis not present

## 2023-09-23 DIAGNOSIS — M25559 Pain in unspecified hip: Secondary | ICD-10-CM | POA: Diagnosis not present

## 2023-09-23 DIAGNOSIS — W19XXXA Unspecified fall, initial encounter: Secondary | ICD-10-CM | POA: Insufficient documentation

## 2023-09-23 DIAGNOSIS — S300XXA Contusion of lower back and pelvis, initial encounter: Secondary | ICD-10-CM | POA: Diagnosis not present

## 2023-09-23 DIAGNOSIS — I10 Essential (primary) hypertension: Secondary | ICD-10-CM | POA: Insufficient documentation

## 2023-09-23 DIAGNOSIS — F0280 Dementia in other diseases classified elsewhere without behavioral disturbance: Secondary | ICD-10-CM | POA: Insufficient documentation

## 2023-09-23 DIAGNOSIS — T07XXXA Unspecified multiple injuries, initial encounter: Secondary | ICD-10-CM

## 2023-09-23 DIAGNOSIS — G20C Parkinsonism, unspecified: Secondary | ICD-10-CM | POA: Diagnosis not present

## 2023-09-23 DIAGNOSIS — G9389 Other specified disorders of brain: Secondary | ICD-10-CM | POA: Diagnosis not present

## 2023-09-23 DIAGNOSIS — M47812 Spondylosis without myelopathy or radiculopathy, cervical region: Secondary | ICD-10-CM | POA: Diagnosis not present

## 2023-09-23 DIAGNOSIS — S32009D Unspecified fracture of unspecified lumbar vertebra, subsequent encounter for fracture with routine healing: Secondary | ICD-10-CM | POA: Diagnosis not present

## 2023-09-23 DIAGNOSIS — M48061 Spinal stenosis, lumbar region without neurogenic claudication: Secondary | ICD-10-CM | POA: Diagnosis not present

## 2023-09-23 DIAGNOSIS — M5021 Other cervical disc displacement,  high cervical region: Secondary | ICD-10-CM | POA: Diagnosis not present

## 2023-09-23 LAB — CBC WITH DIFFERENTIAL/PLATELET
Abs Immature Granulocytes: 0.06 10*3/uL (ref 0.00–0.07)
Basophils Absolute: 0 10*3/uL (ref 0.0–0.1)
Basophils Relative: 0 %
Eosinophils Absolute: 0.1 10*3/uL (ref 0.0–0.5)
Eosinophils Relative: 1 %
HCT: 35.5 % — ABNORMAL LOW (ref 39.0–52.0)
Hemoglobin: 12 g/dL — ABNORMAL LOW (ref 13.0–17.0)
Immature Granulocytes: 1 %
Lymphocytes Relative: 9 %
Lymphs Abs: 0.9 10*3/uL (ref 0.7–4.0)
MCH: 30.9 pg (ref 26.0–34.0)
MCHC: 33.8 g/dL (ref 30.0–36.0)
MCV: 91.5 fL (ref 80.0–100.0)
Monocytes Absolute: 0.9 10*3/uL (ref 0.1–1.0)
Monocytes Relative: 9 %
Neutro Abs: 8.1 10*3/uL — ABNORMAL HIGH (ref 1.7–7.7)
Neutrophils Relative %: 80 %
Platelets: 293 10*3/uL (ref 150–400)
RBC: 3.88 MIL/uL — ABNORMAL LOW (ref 4.22–5.81)
RDW: 12.8 % (ref 11.5–15.5)
WBC: 10.1 10*3/uL (ref 4.0–10.5)
nRBC: 0 % (ref 0.0–0.2)

## 2023-09-23 LAB — URINALYSIS, ROUTINE W REFLEX MICROSCOPIC
Bacteria, UA: NONE SEEN
Bilirubin Urine: NEGATIVE
Glucose, UA: NEGATIVE mg/dL
Ketones, ur: NEGATIVE mg/dL
Leukocytes,Ua: NEGATIVE
Nitrite: NEGATIVE
Protein, ur: NEGATIVE mg/dL
RBC / HPF: >50 RBC/hpf (ref 0–5)
Specific Gravity, Urine: 1.025 (ref 1.005–1.030)
pH: 5 (ref 5.0–8.0)

## 2023-09-23 LAB — COMPREHENSIVE METABOLIC PANEL WITH GFR
ALT: 23 U/L (ref 0–44)
AST: 33 U/L (ref 15–41)
Albumin: 3.2 g/dL — ABNORMAL LOW (ref 3.5–5.0)
Alkaline Phosphatase: 56 U/L (ref 38–126)
Anion gap: 9 (ref 5–15)
BUN: 16 mg/dL (ref 8–23)
CO2: 24 mmol/L (ref 22–32)
Calcium: 8.4 mg/dL — ABNORMAL LOW (ref 8.9–10.3)
Chloride: 107 mmol/L (ref 98–111)
Creatinine, Ser: 1.07 mg/dL (ref 0.61–1.24)
GFR, Estimated: >60 mL/min (ref >60)
Glucose, Bld: 113 mg/dL — ABNORMAL HIGH (ref 70–99)
Potassium: 3.6 mmol/L (ref 3.5–5.1)
Sodium: 140 mmol/L (ref 135–145)
Total Bilirubin: 0.9 mg/dL (ref 0.0–1.2)
Total Protein: 5.6 g/dL — ABNORMAL LOW (ref 6.5–8.1)

## 2023-09-23 LAB — CK: Total CK: 445 U/L — ABNORMAL HIGH (ref 49–397)

## 2023-09-23 LAB — TROPONIN I (HIGH SENSITIVITY): Troponin I (High Sensitivity): 8 ng/L (ref <18)

## 2023-09-23 NOTE — Discharge Instructions (Addendum)
 It was a pleasure caring for you today in the emergency department.  Please see your PCP on Monday for follow-up regarding memory care  Please return to the emergency department for any worsening or worrisome symptoms.
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
## 2023-09-23 NOTE — Care Management (Addendum)
 Transition of Care [facility]) - Emergency Department Mini Assessment   Patient Details  Name: Derek Cox. MRN: [id number] Date of Birth: 1945/09/06  [facility] of [facility]) CM/SW Contact:    Corean JAYSON Canary, RN Phone Number: 09/23/2023, 9:26 AM   Clinical Narrative: Consult received patient reviewed, he will be set up with memory care soon, Home health orders written, called wife Derek Cox, left message to discuss.  0945 Derek Cox returned call, she thinks she can manage with daughters help and expect that he may get in memory care this week. Asked for FL2 it will be provided to her. 1020 Spoke to patient and wife in the room gave wife the [facility] she will have her PCP look it over as they have a appointment tomorrow.  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 02/09/2021   Spondylolisthesis of lumbar region 11/06/2019   Pain in right foot 08/14/2018   Erectile dysfunction 09/13/2017   Degeneration of lumbar intervertebral disc 06/22/2017   Low back pain 04/12/2017   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 [place] [address] Phone: [phone] Fax: [phone]  [facility] - [place], [place] - [address] [place] [address] Phone: [phone] Fax: [phone]

## 2023-09-23 NOTE — Progress Notes (Signed)
 Physical Therapy Quick Note  PT has completed initial evaluation.    Overall, patient at supervision assistance level.   PT Follow up recommended: Home Health PT as pt is still emergency status. Pt would ideally benefit from inpatient therapies and likely long-term care, although placement from the ED appears unlikely.  Equipment recommended:  Rolling walker Complete evaluation note to follow.     Leontine Hilt, [place] [facility] [phone]

## 2023-09-23 NOTE — ED Provider Notes (Signed)
  Provider Note MRN:  [id number]  Arrival date & time: 09/23/23    ED Course and Medical Decision Making  Assumed care from Dr Carita  at shift change.  See note from prior team for complete details, in brief:   Clinical Course as of 09/23/23 1115  Sat Sep 23, 2023  0713 Handoff SR 77 yo/m Hx dementia Frequent falls  Imaging pending, if neg > TOC Placement  [SG]  0914 Discussed with patient and spouse at bedside at length.  They do not want any further CT imaging.  He has no chest pain, dyspnea abdominal pain nausea or vomiting.  He is tolerant p.o. intake without difficulty.  It is reasonable to discontinue CT chest and pelvis.  He has some asymmetric swelling to his right lower extremity.  Duplex is pending.  They would like to go home once workup is completed.  Spouse reports they have a spot at a memory care facility and are going to pursue it. [SG]  1113 Duplex neg [SG]    Clinical Course User Index [SG] Elnor Jayson LABOR, DO    Duplex neg per vascular tech  Pt/family eager for dc  Will see pcp on Monday for f/u regarding memory care  Patient in no distress and overall condition is stable. Detailed discussions were had with the patient/guardian regarding current findings, and need for close f/u with PCP or on call doctor. The patient/guardian has been instructed to return immediately if the symptoms worsen in any way for re-evaluation. Patient/guardian verbalized understanding and is in agreement with current care plan. All questions answered prior to discharge.     Procedures  Final Clinical Impressions(s) / ED Diagnoses     ICD-10-CM   1. Recurrent falls  R29.6     2. Multiple contusions  T07.XXXA     3. Skin tear of right forearm without complication, initial encounter  D48.188J       ED Discharge Orders     None         Discharge Instructions      It was a pleasure caring for you today in the emergency department.  Please see your PCP on Monday for  follow-up regarding memory care  Please return to the emergency department for any worsening or worrisome symptoms.          Elnor Jayson LABOR, DO 09/23/23 1115

## 2023-09-23 NOTE — Progress Notes (Signed)
 VASCULAR LAB    Right lower extremity venous duplex has been performed.  See CV proc for preliminary results.  Gave verbal report to Dr. Elnor LIS, [facility], RVT 09/23/2023, 11:12 AM

## 2023-09-23 NOTE — ED Triage Notes (Signed)
 Patient presents to ED via [facility] , they were called out by his wife the patient was having freq falls. Ol bruising to different places on his body , abrasion to right forearm and right upper arm. Bruising to right buttocks c/o lower back pain

## 2023-09-23 NOTE — Evaluation (Signed)
 Physical Therapy Evaluation Patient Details Name: Derek Cox. MRN: [id number] DOB: 26-Mar-1946 Today's Date: 09/23/2023  History of Present Illness  78 y.o. male presents to [facility] hospital on 09/23/2023 for apparent frequent falls. Pt has dementia, limiting history. PMH includes HTN, GBS, BPH, seizure, dementia.  Clinical Impression  Prior to admittance pt was mobilizing independently w/out an AD, per wife refusing to utilize rollator despite significant history of falls. Per pt's wife she assists him w/ all ADLs, and some mobility tasks. Pt was able to ambulate w/ AD and no physical assistance given, but requires heavy verbal cueing for sequencing and occasional physical assistance for walker management in tight spaces and with turns. Pt would benefit from further gait, and stair training. PT will continue to treat pt while he is admitted. Pt's wife feels she is able to continue giving pt the level of assistance he needs and hopes pt will be admitted to memory care early next week. Recommending HHPT at discharge to address remaining mobility deficits and optimize return to PLOF.          If plan is discharge home, recommend the following: A little help with walking and/or transfers;A little help with bathing/dressing/bathroom;Assistance with cooking/housework;Direct supervision/assist for medications management;Direct supervision/assist for financial management;Assist for transportation;Help with stairs or ramp for entrance;Supervision due to cognitive status   Can travel by private vehicle        Equipment Recommendations Rolling walker (2 wheels)  Recommendations for Other Services       Functional Status Assessment Patient has had a recent decline in their functional status and demonstrates the ability to make significant improvements in function in a reasonable and predictable amount of time.     Precautions / Restrictions Precautions Precautions: Fall Recall of  Precautions/Restrictions: Intact Restrictions Weight Bearing Restrictions Per Provider Order: No      Mobility  Bed Mobility Overal bed mobility: Needs Assistance Bed Mobility: Supine to Sit, Sit to Supine     Supine to sit: HOB elevated, Used rails, Supervision Sit to supine: HOB elevated, Used rails, Supervision   General bed mobility comments: VC given for sequencing; increased time to complete.    Transfers Overall transfer level: Needs assistance Equipment used: Rolling walker (2 wheels) Transfers: Sit to/from Stand Sit to Stand: Contact guard assist           General transfer comment: Pt completed STS from EOB w/ RW and CGA. Pt braces bilateral LE on bed for improved stability. VC given for sequencing; increased time to complete.    Ambulation/Gait Ambulation/Gait assistance: Contact guard assist Gait Distance (Feet): 75 Feet Assistive device: Rolling walker (2 wheels) Gait Pattern/deviations: Step-through pattern, Shuffle, Trunk flexed, Narrow base of support Gait velocity: reduced Gait velocity interpretation: <1.8 ft/sec, indicate of risk for recurrent falls   General Gait Details: Pt ambulates w/ walker significantly in front of him and is unable to correct despite VC to mobilizing within walker. Pt requires physical assistance for walker manageme to facilitate turns as pt has difficulty command following and navigating turns, ceasing mobilization.  Stairs            Wheelchair Mobility     Tilt Bed    Modified Rankin (Stroke Patients Only)       Balance Overall balance assessment: Needs assistance, History of Falls Sitting-balance support: Feet supported, Bilateral upper extremity supported Sitting balance-Leahy Scale: Poor Sitting balance - Comments: seated EOB w/ BUE supported on surface Postural control: Posterior lean Standing balance support: Bilateral  upper extremity supported, During functional activity, Reliant on assistive device for  balance Standing balance-Leahy Scale: Poor Standing balance comment: reliant on external support                             Pertinent Vitals/Pain Pain Assessment Pain Assessment: Faces Faces Pain Scale: Hurts little more Pain Location: low back (chronic) and mild pain in bilateral LE Pain Descriptors / Indicators: Discomfort, Constant, Grimacing Pain Intervention(s): Limited activity within patient's tolerance, Monitored during session    Home Living Family/patient expects to be discharged to:: Private residence Living Arrangements: Spouse/significant other Available Help at Discharge: Family;Available 24 hours/day Type of Home: House Home Access: Stairs to enter Entrance Stairs-Rails: Doctor, general practice of Steps: 3   Home Layout: One level Home Equipment: Rollator (4 wheels)      Prior Function Prior Level of Function : Needs assist;History of Falls (last six months)       Physical Assist : Mobility (physical);ADLs (physical) Mobility (physical): Bed mobility;Transfers ADLs (physical): Grooming;Bathing;Dressing;Toileting Mobility Comments: pt's spouse assists pt w/ bed mobility and transfer tasks ADLs Comments: pt's spouse assists pt w/ bathing, toileting, dressing, and cooking     Extremity/Trunk Assessment   Upper Extremity Assessment Upper Extremity Assessment: Generalized weakness    Lower Extremity Assessment Lower Extremity Assessment: Generalized weakness    Cervical / Trunk Assessment Cervical / Trunk Assessment: Kyphotic  Communication   Communication Communication: No apparent difficulties    Cognition Arousal: Alert Behavior During Therapy: WFL for tasks assessed/performed   PT - Cognitive impairments: History of cognitive impairments, Awareness, Memory, Attention, Initiation, Sequencing, Problem solving, Safety/Judgement                       PT - Cognition Comments: Pt requires frequent cueing for all  mobility tasks and displays difficulty problem-solving walker mangement in small spaces requiring physical assistance for moving walker. Following commands: Impaired Following commands impaired: Follows one step commands inconsistently     Cueing Cueing Techniques: Verbal cues, Visual cues, Tactile cues     General Comments General comments (skin integrity, edema, etc.): no signs of acute distress    Exercises     Assessment/Plan    PT Assessment Patient needs continued PT services  PT Problem List Decreased strength;Decreased activity tolerance;Decreased balance;Decreased mobility;Decreased coordination;Decreased cognition;Decreased knowledge of use of DME;Decreased safety awareness       PT Treatment Interventions DME instruction;Gait training;Stair training;Functional mobility training;Therapeutic activities;Therapeutic exercise;Balance training;Neuromuscular re-education;Cognitive remediation;Patient/family education;Manual techniques;Modalities    PT Goals (Current goals can be found in the Care Plan section)  Acute Rehab PT Goals Patient Stated Goal: unable to state at this time PT Goal Formulation: With patient/family Time For Goal Achievement: 10/07/23 Potential to Achieve Goals: Fair    Frequency Min 2X/week     Co-evaluation               AM-PAC PT 6 Clicks Mobility  Outcome Measure Help needed turning from your back to your side while in a flat bed without using bedrails?: A Little Help needed moving from lying on your back to sitting on the side of a flat bed without using bedrails?: A Little Help needed moving to and from a bed to a chair (including a wheelchair)?: A Little Help needed standing up from a chair using your arms (e.g., wheelchair or bedside chair)?: A Little Help needed to walk in hospital room?: A Little Help needed climbing  3-5 steps with a railing? : A Lot 6 Click Score: 17    End of Session Equipment Utilized During Treatment: Gait  belt Activity Tolerance: Patient tolerated treatment well Patient left: in bed;with call bell/phone within reach;with family/visitor present Nurse Communication: Mobility status PT Visit Diagnosis: Unsteadiness on feet (R26.81);Muscle weakness (generalized) (M62.81);History of falling (Z91.81)    Time: 9180-9159 PT Time Calculation (min) (ACUTE ONLY): 21 min   Charges:   PT Evaluation $PT Eval Low Complexity: 1 Low   PT General Charges $$ ACUTE PT VISIT: 1 Visit        Leontine Hilt, SPT Acute Rehab [phone]   Leontine Hilt 09/23/2023, 10:24 AM

## 2023-09-23 NOTE — ED Notes (Signed)
 Vascular at bedside

## 2023-09-23 NOTE — ED Notes (Signed)
 CCMD called.

## 2023-09-23 NOTE — ED Provider Notes (Signed)
 [place] EMERGENCY DEPARTMENT AT [facility] Provider Note   CSN: [id number] Arrival date & time: 09/23/23  9587     Patient presents with: Derek Cox. is a 78 y.o. male.   Patient with a history of hypertension, Guillain-Barr syndrome, BPH, previous seizures, dementia here by EMS.  He does not know why he is here.  Reportedly wife called out for recurrent falls.  He does not recall falling today.  He does have a fresh appearing skin tear to his right forearm.  When asked why he came to the ED he says he is here because my wife got into a ruckus with a toaster.  He complains of some low back pain which he says is a chronic issue.  Denies any new pain.  No head, neck, chest or abdominal pain.  He states he always has some right-sided back pain which is unchanged.  Does look like he has a new skin tear to his right upper arm and a older skin tear to his right posterior arm.  Will await family for additional history.  Discussed with patient's wife [name] by phone.  She states patient is progressing rapidly with his dementia.  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  He takes Aricept .  She states he has had frequent falls several times a day for the past 1 year including tonight.  He has had several falls today including injuring his arm and his upper back and leg.  Does not think he hit his head today.  She has no help at home to help care for the patient.  They will be considered for placement in memory care unit next week. She called EMS tonight because the patient was wandering and had about 6 falls in the past 24 hours.    The history is provided by the patient.  Fall Pertinent negatives include no chest pain, no abdominal pain, no headaches and no shortness of breath.       Prior to Admission medications   Medication Sig Start Date End Date Taking? Authorizing Provider  donepezil  (ARICEPT ) 5  MG tablet Take 1 tablet (5 mg total) by mouth at bedtime. 03/27/23   Ines Onetha NOVAK, MD  levETIRAcetam  (KEPPRA ) 750 MG tablet TAKE 1 TABLET BY MOUTH 2 TIMES DAILY. 08/28/23   [name], MD  lisinopril  (ZESTRIL ) 20 MG tablet TAKE 1 TABLET EVERY DAY 12/22/22   [name], MD  rosuvastatin  (CRESTOR ) 5 MG tablet TAKE 1 TABLET EVERY DAY 12/22/22   [name], MD  tamsulosin  (FLOMAX ) 0.4 MG CAPS capsule Take 2 capsules (0.8 mg total) by mouth daily. 01/25/23   [name], MD    Allergies: Dilantin  [phenytoin ]    Review of Systems  Constitutional:  Negative for activity change, appetite change and fever.  HENT:  Negative for congestion and rhinorrhea.   Respiratory:  Negative for cough, chest tightness and shortness of breath.   Cardiovascular:  Negative for chest pain.  Gastrointestinal:  Negative for abdominal pain, nausea and vomiting.  Genitourinary:  Negative for dysuria and hematuria.  Musculoskeletal:  Positive for arthralgias, back pain and myalgias.  Skin:  Positive for wound.  Neurological:  Negative for dizziness, weakness and headaches.   all other systems are negative except as noted in the HPI and PMH.    Updated Vital Signs BP (!) 141/86 (BP Location: Left Arm)   Pulse 87  Temp 98.2 F (36.8 C) (Oral)   Resp 18   Ht 5' 10 (1.778 m)   Wt 72.6 kg   SpO2 100%   BMI 22.97 kg/m   Physical Exam Vitals and nursing note reviewed.  Constitutional:      General: He is not in acute distress.    Appearance: He is well-developed.     Comments: Oriented to person and place.  Believes month is April  HENT:     Head: Normocephalic and atraumatic.     Mouth/Throat:     Pharynx: No oropharyngeal exudate.   Eyes:     Conjunctiva/sclera: Conjunctivae normal.     Pupils: Pupils are equal, round, and reactive to light.   Neck:     Comments: No meningismus. Cardiovascular:     Rate and Rhythm: Normal rate and regular rhythm.     Heart sounds: Normal heart  sounds. No murmur heard. Pulmonary:     Effort: Pulmonary effort is normal. No respiratory distress.     Breath sounds: Normal breath sounds.  Abdominal:     Palpations: Abdomen is soft.     Tenderness: There is no abdominal tenderness. There is no guarding or rebound.   Musculoskeletal:        General: No tenderness. Normal range of motion.     Cervical back: Normal range of motion and neck supple.     Right lower leg: Edema present.     Comments: Pelvis stable.  Full range of motion bilateral hips and knees Ecchymosis to bilateral flanks and buttocks.  No crepitus.   Skin:    General: Skin is warm.     Comments: Skin tear right forearm.  Healing skin tear right posterior upper   Neurological:     Mental Status: He is alert and oriented to person, place, and time.     Cranial Nerves: No cranial nerve deficit.     Motor: No abnormal muscle tone.     Coordination: Coordination normal.     Comments:  5/5 strength throughout. CN 2-12 intact.Equal grip strength.   Psychiatric:        Behavior: Behavior normal.     (all labs ordered are listed, but only abnormal results are displayed) Labs Reviewed  CBC WITH DIFFERENTIAL/PLATELET - Abnormal; Notable for the following components:      Result Value   RBC 3.88 (*)    Hemoglobin 12.0 (*)    HCT 35.5 (*)    Neutro Abs 8.1 (*)    All other components within normal limits  COMPREHENSIVE METABOLIC PANEL WITH GFR - Abnormal; Notable for the following components:   Glucose, Bld 113 (*)    Calcium  8.4 (*)    Total Protein 5.6 (*)    Albumin 3.2 (*)    All other components within normal limits  CK - Abnormal; Notable for the following components:   Total CK 445 (*)    All other components within normal limits  URINALYSIS, ROUTINE W REFLEX MICROSCOPIC  TROPONIN I (HIGH SENSITIVITY)  TROPONIN I (HIGH SENSITIVITY)    EKG: EKG Interpretation Date/Time:  Saturday September 23 2023 04:27:02 EDT Ventricular Rate:  85 PR  Interval:  146 QRS Duration:  96 QT Interval:  378 QTC Calculation: 450 R Axis:   63  Text Interpretation: Sinus rhythm Ventricular premature complex Borderline repolarization abnormality No significant change was found Confirmed by Carita Senior [phone]) on 09/23/2023 4:36:45 AM  Radiology: CT Lumbar Spine Wo Contrast Result Date: 09/23/2023 CLINICAL DATA:  78 year old male with frequent falls. Bruising and pain. EXAM: CT LUMBAR SPINE WITHOUT CONTRAST TECHNIQUE: Multidetector CT imaging of the lumbar spine was performed without intravenous contrast administration. Multiplanar CT image reconstructions were also generated. RADIATION DOSE REDUCTION: This exam was performed according to the departmental dose-optimization program which includes automated exposure control, adjustment of the mA and/or kV according to patient size and/or use of iterative reconstruction technique. COMPARISON:  Lumbar MRI 10/16/2018. FINDINGS: Segmentation: Normal, the same numbering system used on the previous MRI. Alignment: Stable since 2020. Chronic grade 1 anterolisthesis of L4 on L5. Interval postoperative changes at that level detailed below. Mild underlying levoconvex lumbar scoliosis. Vertebrae: There are subacute healing fractures of the right lumbar transverse processes at L1 (series 4, image 21), L2, and L3. The L2 transverse process is most comminuted. Those fractures are mildly displaced. Stable vertebral height since 2020, maintained with chronic L4 superior endplate Schmorl's node. Postoperative changes at L4-L5 are detailed below. No superimposed acute osseous abnormality identified. Visible sacrum and SI joints appear intact. Paraspinal and other soft tissues: Aortoiliac calcified atherosclerosis. Postoperative changes to the lumbar paraspinal soft tissues with no adverse features. Negative visible noncontrast abdominal and pelvic viscera. Disc levels: L2-L3: Severe disc space loss with vacuum disc.  Circumferential disc osteophyte complex asymmetric to the left. Mild to moderate posterior element hypertrophy. Mild spinal stenosis, moderate left lateral recess stenosis (left L3 nerve level). Moderate left and mild right L2 foraminal stenosis. This level does not appear significantly changed. Interval decompression and fusion at L4-L5. Bilateral pedicle screws in place without loosening. Interbody implant with mild endplate subsidence. Solid-appearing posterior element arthrodesis. Substantially progressed since 2020 and severe L5-S1 disc space loss with bulky endplate spurring and sclerosis. Disc appears somewhat asymmetric to the right, with moderate posterior element hypertrophy. Only mild spinal stenosis suspected. At least moderate bilateral L5 foraminal stenosis appears progressed. IMPRESSION: 1. Subacute healing fractures of the right lumbar transverse processes of L1, L2, and L3. No superimposed acute osseous abnormality identified in the lumbar spine. 2. Decompression and fusion at L4-L5 since 2020. Posterior element arthrodesis there. 3. Substantially progressed degeneration at the L5-S1 adjacent segment since 2020, severe disc space loss, vacuum disc and bulky endplate spurring. Mild spinal stenosis with at least moderate bilateral L5 foraminal stenosis now suspected. 4. Advanced chronic degeneration at L2-L3 not significantly changed. 5.  Aortic Atherosclerosis (ICD10-I70.0). Electronically Signed   By: VEAR Hurst M.D.   On: 09/23/2023 06:21   CT Cervical Spine Wo Contrast Result Date: 09/23/2023 CLINICAL DATA:  78 year old male with frequent falls. Bruising and pain. EXAM: CT CERVICAL SPINE WITHOUT CONTRAST TECHNIQUE: Multidetector CT imaging of the cervical spine was performed without intravenous contrast. Multiplanar CT image reconstructions were also generated. RADIATION DOSE REDUCTION: This exam was performed according to the departmental dose-optimization program which includes automated  exposure control, adjustment of the mA and/or kV according to patient size and/or use of iterative reconstruction technique. COMPARISON:  Head CT today.  Cervical spine MRI 05/17/2021. FINDINGS: Alignment: Cervical lordosis is stable from the 2023 MRI. Mild chronic retrolisthesis of C3 on C4. Partially visible cervicothoracic junction alignment appears maintained. Aligned posterior elements. Skull base and vertebrae: Bone mineralization is within normal limits for age. Visualized skull base is intact. No atlanto-occipital dissociation. C1 and C2 appear intact and aligned. Chronic postoperative details are below. No acute osseous abnormality identified. Soft tissues and spinal canal: No prevertebral fluid or swelling. No visible canal hematoma. Calcified cervical carotid atherosclerosis greater on the right. Disc  levels: Chronic ACDF C4 through C7. Solid arthrodesis. No hardware loosening is identified. Adjacent segment disease at C3-C4 with disc space loss, retrolisthesis, endplate spurring. Moderate chronic C2-C3 facet arthropathy on the left. Upper chest: Minimally included. IMPRESSION: 1. No acute traumatic injury identified in the cervical spine. 2. Chronic ACDF C4 through C7 with solid arthrodesis. Adjacent segment disease at C3-C4, and left side chronic facet arthropathy at C2-C3. Electronically Signed   By: VEAR Hurst M.D.   On: 09/23/2023 06:01   CT Head Wo Contrast Result Date: 09/23/2023 CLINICAL DATA:  78 year old male with frequent falls. Bruising and pain. EXAM: CT HEAD WITHOUT CONTRAST TECHNIQUE: Contiguous axial images were obtained from the base of the skull through the vertex without intravenous contrast. RADIATION DOSE REDUCTION: This exam was performed according to the departmental dose-optimization program which includes automated exposure control, adjustment of the mA and/or kV according to patient size and/or use of iterative reconstruction technique. COMPARISON:  Head CT 02/12/2022.  Brain MRI  02/15/2023. FINDINGS: Brain: Chronic encephalomalacia communicating with the left lateral ventricle throughout much of the left superior frontal gyrus. Overlying chronic craniotomy. Stable mild generalized ventriculomegaly. No midline shift, mass effect, evidence of mass lesion, intracranial hemorrhage or evidence of cortically based acute infarction. Patchy and confluent bilateral cerebral white matter hypodensity. Stable gray-white matter differentiation throughout the brain. Vascular: No suspicious intracranial vascular hyperdensity. Calcified atherosclerosis at the skull base. Skull: Chronic left vertex craniotomy is stable. No acute osseous abnormality identified. Sinuses/Orbits: Visualized paranasal sinuses and mastoids are stable and well aerated. Other: No acute orbit or scalp soft tissue finding identified. IMPRESSION: 1. No acute intracranial abnormality or acute traumatic injury identified. 2. Chronic encephalomalacia throughout the left superior frontal gyrus with overlying craniotomy. Chronic cerebral white matter disease. Electronically Signed   By: VEAR Hurst M.D.   On: 09/23/2023 05:58   DG Chest Port 1 View Result Date: 09/23/2023 CLINICAL DATA:  78 year old male with frequent falls. Bruising and pain. EXAM: PORTABLE CHEST 1 VIEW COMPARISON:  Chest radiographs 11/08/2012 and earlier. FINDINGS: Portable AP semi upright view at 0502 hours. Mildly lower lung volumes since 2014. Mediastinal contours are stable and within normal limits. Visualized tracheal air column is within normal limits. Allowing for portable technique the lungs are clear. No pneumothorax or pleural effusion. Negative visible bowel gas. Chronic but increased cervical ACDF is visible. No acute osseous abnormality identified. IMPRESSION: No acute cardiopulmonary abnormality or acute traumatic injury identified. Electronically Signed   By: VEAR Hurst M.D.   On: 09/23/2023 05:33   DG Forearm Right Result Date: 09/23/2023 CLINICAL DATA:   78 year old male with frequent falls. Bruising and pain. EXAM: RIGHT FOREARM - 2 VIEW COMPARISON:  None Available. FINDINGS: Three views at 0506 hours. Bone mineralization is within normal limits for age. Alignment appears maintained at the right wrist and elbow. No acute fracture or dislocation identified. External artifact, no discrete soft tissue injury. IMPRESSION: No acute fracture or dislocation identified about the right forearm. Electronically Signed   By: VEAR Hurst M.D.   On: 09/23/2023 05:32   DG Pelvis Portable Result Date: 09/23/2023 CLINICAL DATA:  78 year old male with frequent falls. Bruising and pain. EXAM: PORTABLE PELVIS 1-2 VIEWS COMPARISON:  Multiple abdominal radiographs in 2010. FINDINGS: Portable AP supine view at 0509 hours. Lower lumbar posterior and interbody fusion sequelae new since 2010. Bone mineralization is within normal limits for age. Femoral heads are normally located. Postoperative changes in the region of the prostate. Bony pelvis appears intact. Symmetric SI  joints. Grossly intact proximal femurs. Nonobstructed bowel gas. IMPRESSION: No acute fracture or dislocation identified about the pelvis. Electronically Signed   By: VEAR Hurst M.D.   On: 09/23/2023 05:30     Procedures   Medications Ordered in the ED - No data to display  Clinical Course as of 09/23/23 0738  Sat Sep 23, 2023  0713 Handoff SR 77 yo/m Hx dementia Frequent falls  Imaging pending, if neg > TOC Placement  [SG]    Clinical Course User Index [SG] Elnor Jayson LABOR, DO                                 Medical Decision Making Amount and/or Complexity of Data Reviewed Labs: ordered. Decision-making details documented in ED Course. Radiology: ordered and independent interpretation performed. Decision-making details documented in ED Course. ECG/medicine tests: ordered and independent interpretation performed. Decision-making details documented in ED Course.   Patient with known Parkinson's  disease and dementia here with from home with recurrent falls.  Progressively worsening over the past year per wife on phone.  He has multiple areas of old bruising above his flanks, arms and legs.  He denies falling tonight but wife states he is fallen several times in the past day.  He is stable vital signs.  He denies any pain complaints.  Does complain of back pain which appears to be chronic though he does have large areas of ecchymosis and bruising across his back and flanks.  Good strength throughout his arms and legs. Will hydrate, obtain basic labs, CT head and C-spine.  Discussed goals of visit with wife.  Discussed that unlikely to fix his recurrent falls and gait disturbances today.  He follows with neurology.  May benefit from home home health evaluation.  CT head and C-spine negative for acute traumatic injury.  Chest x-ray and pelvis x-ray and right forearm x-ray negative/  Labs reassuring.  CK mildly elevated at 445. Cr normal.   Healing transverse process fractures on lumbar spine CT.  Given his ecchymosis involving his bilateral flanks and low back we will obtain full abdominal pelvis imaging.  He will need evaluation by case manager and possible PT for home health  CT abdomen pelvis will be obtained given extensive ecchymosis to his buttocks and flanks.  He remains hemodynamically stable.  His is calmer now that his wife is arrived and he is seated in the recliner.  Doppler pending for right lower extremity edema as well as TOC consult.  Care to be transferred at shift change pending TOC evaluation, CT abdomen pelvis and RLE doppler     Final diagnoses:  None    ED Discharge Orders     None          Deletha Jaffee, Garnette, MD 09/23/23 [phone]

## 2023-09-23 NOTE — ED Notes (Signed)
 Patient trying to crawl off the end of the stretcher , redirected patient and put back in bed. Patient  cursing at staff again redirected patient

## 2023-09-25 ENCOUNTER — Encounter: Payer: Self-pay | Admitting: Family Medicine

## 2023-09-25 ENCOUNTER — Ambulatory Visit (INDEPENDENT_AMBULATORY_CARE_PROVIDER_SITE_OTHER): Admitting: Family Medicine

## 2023-09-25 VITALS — BP 110/60 | HR 87 | Temp 98.3°F

## 2023-09-25 DIAGNOSIS — M109 Gout, unspecified: Secondary | ICD-10-CM

## 2023-09-25 DIAGNOSIS — M545 Low back pain, unspecified: Secondary | ICD-10-CM | POA: Diagnosis not present

## 2023-09-25 DIAGNOSIS — S50811D Abrasion of right forearm, subsequent encounter: Secondary | ICD-10-CM

## 2023-09-25 DIAGNOSIS — G8929 Other chronic pain: Secondary | ICD-10-CM

## 2023-09-25 DIAGNOSIS — R4189 Other symptoms and signs involving cognitive functions and awareness: Secondary | ICD-10-CM

## 2023-09-25 DIAGNOSIS — G4700 Insomnia, unspecified: Secondary | ICD-10-CM | POA: Diagnosis not present

## 2023-09-25 DIAGNOSIS — G20C Parkinsonism, unspecified: Secondary | ICD-10-CM

## 2023-09-25 MED ORDER — METHYLPREDNISOLONE ACETATE 40 MG/ML IJ SUSP
40.0000 mg | Freq: Once | INTRAMUSCULAR | Status: AC
  Administered 2023-09-25: 40 mg via INTRAMUSCULAR

## 2023-09-25 MED ORDER — TEMAZEPAM 30 MG PO CAPS: 30.0000 mg | ORAL_CAPSULE | Freq: Every evening | ORAL | 1 refills | Status: DC | PRN

## 2023-09-25 MED ORDER — METHYLPREDNISOLONE ACETATE 80 MG/ML IJ SUSP
80.0000 mg | Freq: Once | INTRAMUSCULAR | Status: AC
  Administered 2023-09-25: 80 mg via INTRAMUSCULAR

## 2023-09-25 NOTE — Progress Notes (Signed)
 Subjective:    Patient ID: Derek Cox., male    DOB: 13-Mar-1946, 78 y.o.   MRN: [id number]  HPI Here with his wife and daughter to follow up on an ED visit on 09-23-23 after a fall. He has had frequent falls the past 6 months, and that day he sustained a skin tear on the right forearm and a large bruise on the right lower back. CT scans of the cervical spine and head were clear. A CT of the lumbar spine showed subacute fractures in L1,L2, and L3. He also had asymmetric swelling in the right leg. A venous doppler was negative for DVT, and it showed interstitial edema. Labs showed a normal creatinine at 1.07 and a mild anemia with Hgb 12.0. He had seen Dr. Norleen Blumenthal at [facility] on 07-28-23 for his memory losses and frequent falling. He felt that Lee's symptoms are from a combination of his old brain bleed that caused scarring and of Parkinsonian features. 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 He still takes Aricept . He has trouble sleeping, so the family asked for something to help this. His dementia has been progressing rapidly, so they have located a bed at the [facility] which is a [facility] [employer] [facility] in [place]. They have an FL-2 form filled out today for us  to sign. His right lower lag and foot remain swollen, and the right great toe will periodically turn bright red. He denies any pain. He has a long hx of gout, and the family thinks the foot swelling is all the result of a gout flare.    Review of Systems  Constitutional: Negative.   Respiratory: Negative.    Cardiovascular: Negative.   Gastrointestinal: Negative.   Genitourinary: Negative.   Musculoskeletal:  Positive for arthralgias.  Neurological:  Positive for weakness. Negative for dizziness, tremors and seizures.       Objective:   Physical Exam Constitutional:      Comments: In a wheelchair    Cardiovascular:     Rate and Rhythm: Normal rate and regular  rhythm.     Pulses: Normal pulses.     Heart sounds: Normal heart sounds.  Pulmonary:     Effort: Pulmonary effort is normal.     Breath sounds: Normal breath sounds.   Musculoskeletal:     Comments: There is a large ecchymosis and a large hematoma on the lower back. These are tender. The right lower leg is swollen from the knee down to the foot. The right great toe is also red and warm, but surprisingly not tender    Skin:    Comments: There is a 3 cm abrasion on the right forearm. This looks clean. We will dress it with Vaseline and gauze.   Neurological:     Mental Status: He is alert.           Assessment & Plan:  Hs has had several recent falls resulting in contusions to the lower back and buttocks, he also has an abrasion on the right arm. He also has a rapidly progressing dementia with some Parkinsonian features. He has been tried on Risperidone and Quetiapine, but these did not help at all. He remains on Aricept . He will move to the memory care facility soon as above. He will try Temazepam  at bedtime for sleep. For the gout in the right toe, he is given a shot of DepoMedrol. We spent a total of ( 35  )  minutes reviewing records and discussing these issues.  Garnette Olmsted, MD

## 2023-09-25 NOTE — Telephone Encounter (Signed)
 Noted

## 2023-09-25 NOTE — Addendum Note (Signed)
 Addended by: LADONNA INOCENTE SAILOR on: 09/25/2023 05:18 PM   Modules accepted: Orders

## 2023-09-26 ENCOUNTER — Ambulatory Visit: Payer: Self-pay

## 2023-09-26 NOTE — Telephone Encounter (Signed)
 FYI Only or Action Required?: Action required by provider: update on patient condition.  Patient was last seen in primary care on 09/25/2023 by [name], MD. Called Nurse Triage reporting medication question. Symptoms began today. Interventions attempted: Prescription medications: temazepam . Symptoms are: gradually improving.  Triage Disposition: Call PCP When Office is Open  Patient/caregiver understands and will follow disposition?: Yes  Reason for Disposition  [1] Caller has NON-URGENT medicine question about med that PCP prescribed AND [2] triager unable to answer question  Answer Assessment - Initial Assessment Questions 1. NAME of MEDICINE: What medicine(s) are you calling about?     Temazepam  30 mg 2. QUESTION: What is your question? (e.g., double dose of medicine, side effect)     I spoke to the wife of patient who reports that he took this medication for the first time last night.  She reports that he slept very soundly and woke up feeling a little dizzy.  He has since had four or five naps today.  She reports that when he is awake, he has been much more like himself and she feels like she has her husband back.  But she wants to make sure that this dosage is appropriate and that the napping is ok. 3. PRESCRIBER: Who prescribed the medicine? Reason: if prescribed by specialist, call should be referred to that group.     Johnny  Protocols used: Medication Question Call-A-AH

## 2023-09-27 ENCOUNTER — Telehealth: Payer: Self-pay

## 2023-09-27 MED ORDER — TEMAZEPAM 15 MG PO CAPS: 15.0000 mg | ORAL_CAPSULE | Freq: Every evening | ORAL | 5 refills | Status: DC | PRN

## 2023-09-27 NOTE — Addendum Note (Signed)
 Addended by: JOHNNY SENIOR A on: 09/27/2023 12:56 PM   Modules accepted: Orders

## 2023-09-27 NOTE — Addendum Note (Signed)
 Addended by: JOHNNY SENIOR A on: 09/27/2023 01:04 PM   Modules accepted: Orders

## 2023-09-27 NOTE — Telephone Encounter (Signed)
 Copied from CRM [id number]. Topic: Clinical - Medication Question >> Sep 27, 2023 11:38 AM Shereese L wrote: Reason for CRM: patient is asking that he gets anew prescription for zanax due him having severe anxiety and restlessness during the day and needs something to calm him down

## 2023-09-27 NOTE — Telephone Encounter (Signed)
 The new RX is printed out

## 2023-09-28 ENCOUNTER — Ambulatory Visit: Payer: Self-pay

## 2023-09-28 NOTE — Telephone Encounter (Signed)
 Left a detailed message on pt spouse mobile number regarding refill to Temazepam  for both

## 2023-09-28 NOTE — Telephone Encounter (Signed)
 Copied from CRM [id number]. Topic: Clinical - Red Word Triage >> Sep 28, 2023 12:53 PM Shardie S wrote: [facility] that prompted transfer to Nurse Triage: blood in urine   FYI Only or Action Required?: Action required by provider: Requesting an antibiotic for his urine.  Patient was last seen in primary care on 09/25/2023 by [name], MD. Called Nurse Triage reporting Hematuria. Symptoms began today. Interventions attempted: Nothing. Symptoms are: unchanged.  Triage Disposition: See Physician Within 24 Hours  Patient/caregiver understands and will follow disposition?: No, wishes to speak with PCP   Reason for Disposition  Blood in urine  (Exception: Could be normal menstrual bleeding.)  Answer Assessment - Initial Assessment Questions I advised the patient and his wife that there are no appointment's available until next week and that they should go to urgent care to have his symptoms evaluated. She states that she would prefer to avoid that and is requesting an antibiotic be called in if possible. Please advise.       1. COLOR of URINE: Describe the color of the urine.  (e.g., tea-colored, pink, red, bloody) Do you have blood clots in your urine? (e.g., none, pea, grape, small coin)     Light pink 2. ONSET: When did the bleeding start?      Today  3. EPISODES: How many times has there been blood in the urine? or How many times today?     Once 4. PAIN with URINATION: Is there any pain with passing your urine? If Yes, ask: How bad is the pain?  (Scale 1-10; or mild, moderate, severe)    - MILD: Complains slightly about urination hurting.    - MODERATE: Interferes with normal activities.      - SEVERE: Excruciating, unwilling or unable to urinate because of the pain.      No 5. FEVER: Do you have a fever? If Yes, ask: What is your temperature, how was it measured, and when did it start?     No 6. ASSOCIATED SYMPTOMS: Are you passing urine more frequently than  usual?     No 7. OTHER SYMPTOMS: Do you have any other symptoms? (e.g., back/flank pain, abdomen pain, vomiting)     No  Protocols used: Urine - Blood In-A-AH

## 2023-09-28 NOTE — Telephone Encounter (Signed)
 Left detailed message for on pt spouse advised that Dr Johnny has left for the weekend and will be back on Monday. Advised to call the office back with any questions

## 2023-10-02 ENCOUNTER — Telehealth: Payer: Self-pay

## 2023-10-02 MED ORDER — ALPRAZOLAM 0.5 MG PO TABS: 0.5000 mg | ORAL_TABLET | Freq: Three times a day (TID) | ORAL | 5 refills | Status: DC | PRN

## 2023-10-02 MED ORDER — TRAZODONE HCL 50 MG PO TABS
ORAL_TABLET | ORAL | 2 refills | Status: DC
Start: 2023-10-02 — End: 2023-10-16

## 2023-10-02 MED ORDER — CIPROFLOXACIN HCL 500 MG PO TABS: 500.0000 mg | ORAL_TABLET | Freq: Two times a day (BID) | ORAL | 0 refills | Status: DC

## 2023-10-02 NOTE — Telephone Encounter (Signed)
 Rx for Xanaz was faxed. Attempt to reach pharmacy. Unsuccessful. Will try again.

## 2023-10-02 NOTE — Telephone Encounter (Signed)
 We are sending in a RX for Xanax  and for Cipro  (antibiotic)

## 2023-10-02 NOTE — Telephone Encounter (Signed)
 See my answer to the other message that day

## 2023-10-02 NOTE — Telephone Encounter (Signed)
 See other note

## 2023-10-02 NOTE — Telephone Encounter (Signed)
 Called [facility], needed to know if [facility]

## 2023-10-02 NOTE — Telephone Encounter (Signed)
 Copied from CRM [id number]. Topic: Clinical - Medication Question >> Oct 02, 2023 11:46 AM Zenovia PARAS wrote: Reason for CRM: Karleen RAMAN calling from [facility] calling to clarify medication ALPRAZolam  (XANAX ) 0.5 MG tablet. Contact phone number is [phone]

## 2023-10-02 NOTE — Addendum Note (Signed)
 Addended by: JOHNNY SENIOR A on: 10/02/2023 04:53 PM   Modules accepted: Orders

## 2023-10-02 NOTE — Addendum Note (Signed)
 Addended by: JOHNNY SENIOR A on: 10/02/2023 08:15 AM   Modules accepted: Orders

## 2023-10-02 NOTE — Telephone Encounter (Signed)
 Tell him that the patient may continue taking the Xanax  as needed for anxiety, but I have STOPPED the Temazepam . Instead I have sent in a rx for Trazodone  to use for sleep

## 2023-10-03 NOTE — Telephone Encounter (Signed)
 Contacted the pharmacy and spoke to Natalie, one of the pharmacists. Update her of provider's advise. She states she will d/c the temazepam  and work on the trazodone .    Attempted to update pt. Left a voicemail to call us  back.

## 2023-10-10 ENCOUNTER — Emergency Department (HOSPITAL_COMMUNITY)

## 2023-10-10 ENCOUNTER — Other Ambulatory Visit: Payer: Self-pay

## 2023-10-10 ENCOUNTER — Encounter (HOSPITAL_COMMUNITY): Payer: Self-pay

## 2023-10-10 ENCOUNTER — Inpatient Hospital Stay (HOSPITAL_COMMUNITY)
Admission: EM | Admit: 2023-10-10 | Discharge: 2023-10-24 | DRG: 056 | Disposition: A | Discharge status: Home Health | Attending: Internal Medicine | Admitting: Internal Medicine

## 2023-10-10 DIAGNOSIS — S199XXA Unspecified injury of neck, initial encounter: Secondary | ICD-10-CM | POA: Diagnosis not present

## 2023-10-10 DIAGNOSIS — S0003XA Contusion of scalp, initial encounter: Secondary | ICD-10-CM | POA: Diagnosis not present

## 2023-10-10 DIAGNOSIS — Z79899 Other long term (current) drug therapy: Secondary | ICD-10-CM

## 2023-10-10 DIAGNOSIS — Z515 Encounter for palliative care: Secondary | ICD-10-CM | POA: Diagnosis not present

## 2023-10-10 DIAGNOSIS — R410 Disorientation, unspecified: Secondary | ICD-10-CM | POA: Diagnosis not present

## 2023-10-10 DIAGNOSIS — M47812 Spondylosis without myelopathy or radiculopathy, cervical region: Secondary | ICD-10-CM | POA: Diagnosis not present

## 2023-10-10 DIAGNOSIS — Z981 Arthrodesis status: Secondary | ICD-10-CM

## 2023-10-10 DIAGNOSIS — Z8673 Personal history of transient ischemic attack (TIA), and cerebral infarction without residual deficits: Secondary | ICD-10-CM

## 2023-10-10 DIAGNOSIS — Z85828 Personal history of other malignant neoplasm of skin: Secondary | ICD-10-CM | POA: Diagnosis not present

## 2023-10-10 DIAGNOSIS — F0280 Dementia in other diseases classified elsewhere without behavioral disturbance: Secondary | ICD-10-CM | POA: Insufficient documentation

## 2023-10-10 DIAGNOSIS — W19XXXA Unspecified fall, initial encounter: Secondary | ICD-10-CM | POA: Diagnosis present

## 2023-10-10 DIAGNOSIS — I10 Essential (primary) hypertension: Secondary | ICD-10-CM | POA: Diagnosis not present

## 2023-10-10 DIAGNOSIS — R58 Hemorrhage, not elsewhere classified: Secondary | ICD-10-CM | POA: Diagnosis not present

## 2023-10-10 DIAGNOSIS — N179 Acute kidney failure, unspecified: Secondary | ICD-10-CM | POA: Diagnosis not present

## 2023-10-10 DIAGNOSIS — D62 Acute posthemorrhagic anemia: Secondary | ICD-10-CM | POA: Diagnosis not present

## 2023-10-10 DIAGNOSIS — R079 Chest pain, unspecified: Secondary | ICD-10-CM | POA: Diagnosis not present

## 2023-10-10 DIAGNOSIS — R4689 Other symptoms and signs involving appearance and behavior: Secondary | ICD-10-CM | POA: Diagnosis present

## 2023-10-10 DIAGNOSIS — Z801 Family history of malignant neoplasm of trachea, bronchus and lung: Secondary | ICD-10-CM

## 2023-10-10 DIAGNOSIS — G3183 Dementia with Lewy bodies: Principal | ICD-10-CM | POA: Diagnosis present

## 2023-10-10 DIAGNOSIS — R220 Localized swelling, mass and lump, head: Secondary | ICD-10-CM | POA: Diagnosis not present

## 2023-10-10 DIAGNOSIS — R000 Tachycardia, unspecified: Secondary | ICD-10-CM | POA: Diagnosis present

## 2023-10-10 DIAGNOSIS — Z823 Family history of stroke: Secondary | ICD-10-CM

## 2023-10-10 DIAGNOSIS — G20A1 Parkinson's disease without dyskinesia, without mention of fluctuations: Secondary | ICD-10-CM | POA: Diagnosis present

## 2023-10-10 DIAGNOSIS — F419 Anxiety disorder, unspecified: Secondary | ICD-10-CM | POA: Diagnosis present

## 2023-10-10 DIAGNOSIS — F02818 Dementia in other diseases classified elsewhere, unspecified severity, with other behavioral disturbance: Secondary | ICD-10-CM | POA: Diagnosis present

## 2023-10-10 DIAGNOSIS — Z87891 Personal history of nicotine dependence: Secondary | ICD-10-CM

## 2023-10-10 DIAGNOSIS — G4730 Sleep apnea, unspecified: Secondary | ICD-10-CM | POA: Diagnosis present

## 2023-10-10 DIAGNOSIS — G20C Parkinsonism, unspecified: Secondary | ICD-10-CM | POA: Diagnosis present

## 2023-10-10 DIAGNOSIS — F03918 Unspecified dementia, unspecified severity, with other behavioral disturbance: Secondary | ICD-10-CM | POA: Diagnosis not present

## 2023-10-10 DIAGNOSIS — Y92009 Unspecified place in unspecified non-institutional (private) residence as the place of occurrence of the external cause: Secondary | ICD-10-CM

## 2023-10-10 DIAGNOSIS — F02811 Dementia in other diseases classified elsewhere, unspecified severity, with agitation: Secondary | ICD-10-CM | POA: Diagnosis present

## 2023-10-10 DIAGNOSIS — Z8669 Personal history of other diseases of the nervous system and sense organs: Secondary | ICD-10-CM | POA: Diagnosis not present

## 2023-10-10 DIAGNOSIS — G9340 Encephalopathy, unspecified: Secondary | ICD-10-CM | POA: Diagnosis not present

## 2023-10-10 DIAGNOSIS — I771 Stricture of artery: Secondary | ICD-10-CM | POA: Diagnosis not present

## 2023-10-10 DIAGNOSIS — R296 Repeated falls: Secondary | ICD-10-CM | POA: Diagnosis present

## 2023-10-10 DIAGNOSIS — S0101XA Laceration without foreign body of scalp, initial encounter: Secondary | ICD-10-CM | POA: Diagnosis present

## 2023-10-10 DIAGNOSIS — R4182 Altered mental status, unspecified: Secondary | ICD-10-CM | POA: Diagnosis not present

## 2023-10-10 DIAGNOSIS — N401 Enlarged prostate with lower urinary tract symptoms: Secondary | ICD-10-CM | POA: Diagnosis present

## 2023-10-10 DIAGNOSIS — G4489 Other headache syndrome: Secondary | ICD-10-CM | POA: Diagnosis not present

## 2023-10-10 DIAGNOSIS — G40909 Epilepsy, unspecified, not intractable, without status epilepticus: Secondary | ICD-10-CM | POA: Diagnosis not present

## 2023-10-10 DIAGNOSIS — Z751 Person awaiting admission to adequate facility elsewhere: Secondary | ICD-10-CM

## 2023-10-10 DIAGNOSIS — R404 Transient alteration of awareness: Secondary | ICD-10-CM | POA: Diagnosis not present

## 2023-10-10 DIAGNOSIS — Z85820 Personal history of malignant melanoma of skin: Secondary | ICD-10-CM | POA: Diagnosis not present

## 2023-10-10 DIAGNOSIS — I6789 Other cerebrovascular disease: Secondary | ICD-10-CM | POA: Diagnosis not present

## 2023-10-10 DIAGNOSIS — Z7401 Bed confinement status: Secondary | ICD-10-CM | POA: Diagnosis not present

## 2023-10-10 DIAGNOSIS — Z888 Allergy status to other drugs, medicaments and biological substances status: Secondary | ICD-10-CM | POA: Diagnosis not present

## 2023-10-10 DIAGNOSIS — G9341 Metabolic encephalopathy: Secondary | ICD-10-CM | POA: Diagnosis present

## 2023-10-10 DIAGNOSIS — E785 Hyperlipidemia, unspecified: Secondary | ICD-10-CM | POA: Diagnosis present

## 2023-10-10 DIAGNOSIS — G9389 Other specified disorders of brain: Secondary | ICD-10-CM | POA: Diagnosis not present

## 2023-10-10 DIAGNOSIS — Z66 Do not resuscitate: Secondary | ICD-10-CM | POA: Diagnosis not present

## 2023-10-10 DIAGNOSIS — S41111A Laceration without foreign body of right upper arm, initial encounter: Secondary | ICD-10-CM | POA: Diagnosis not present

## 2023-10-10 DIAGNOSIS — Z781 Physical restraint status: Secondary | ICD-10-CM

## 2023-10-10 DIAGNOSIS — S0083XA Contusion of other part of head, initial encounter: Secondary | ICD-10-CM | POA: Diagnosis not present

## 2023-10-10 DIAGNOSIS — N138 Other obstructive and reflux uropathy: Secondary | ICD-10-CM

## 2023-10-10 DIAGNOSIS — Z23 Encounter for immunization: Secondary | ICD-10-CM | POA: Diagnosis not present

## 2023-10-10 DIAGNOSIS — Z9181 History of falling: Secondary | ICD-10-CM | POA: Diagnosis not present

## 2023-10-10 DIAGNOSIS — S0990XA Unspecified injury of head, initial encounter: Secondary | ICD-10-CM | POA: Diagnosis not present

## 2023-10-10 LAB — COMPREHENSIVE METABOLIC PANEL WITH GFR
ALT: 19 U/L (ref 0–44)
AST: 36 U/L (ref 15–41)
Albumin: 3.4 g/dL — ABNORMAL LOW (ref 3.5–5.0)
Alkaline Phosphatase: 78 U/L (ref 38–126)
Anion gap: 10 (ref 5–15)
BUN: 29 mg/dL — ABNORMAL HIGH (ref 8–23)
CO2: 20 mmol/L — ABNORMAL LOW (ref 22–32)
Calcium: 8.3 mg/dL — ABNORMAL LOW (ref 8.9–10.3)
Chloride: 106 mmol/L (ref 98–111)
Creatinine, Ser: 1.48 mg/dL — ABNORMAL HIGH (ref 0.61–1.24)
GFR, Estimated: 48 mL/min — ABNORMAL LOW (ref >60)
Glucose, Bld: 144 mg/dL — ABNORMAL HIGH (ref 70–99)
Potassium: 3.9 mmol/L (ref 3.5–5.1)
Sodium: 136 mmol/L (ref 135–145)
Total Bilirubin: 1 mg/dL (ref 0.0–1.2)
Total Protein: 5.8 g/dL — ABNORMAL LOW (ref 6.5–8.1)

## 2023-10-10 LAB — CBC WITH DIFFERENTIAL/PLATELET
Abs Immature Granulocytes: 0.1 K/uL — ABNORMAL HIGH (ref 0.00–0.07)
Basophils Absolute: 0 K/uL (ref 0.0–0.1)
Basophils Relative: 0 %
Eosinophils Absolute: 0 K/uL (ref 0.0–0.5)
Eosinophils Relative: 0 %
HCT: 32.9 % — ABNORMAL LOW (ref 39.0–52.0)
Hemoglobin: 10.8 g/dL — ABNORMAL LOW (ref 13.0–17.0)
Immature Granulocytes: 1 %
Lymphocytes Relative: 5 %
Lymphs Abs: 0.7 K/uL (ref 0.7–4.0)
MCH: 30.9 pg (ref 26.0–34.0)
MCHC: 32.8 g/dL (ref 30.0–36.0)
MCV: 94 fL (ref 80.0–100.0)
Monocytes Absolute: 0.8 K/uL (ref 0.1–1.0)
Monocytes Relative: 5 %
Neutro Abs: 13.7 K/uL — ABNORMAL HIGH (ref 1.7–7.7)
Neutrophils Relative %: 89 %
Platelets: 295 K/uL (ref 150–400)
RBC: 3.5 MIL/uL — ABNORMAL LOW (ref 4.22–5.81)
RDW: 13.1 % (ref 11.5–15.5)
WBC: 15.3 K/uL — ABNORMAL HIGH (ref 4.0–10.5)
nRBC: 0 % (ref 0.0–0.2)

## 2023-10-10 MED ORDER — TETANUS-DIPHTH-ACELL PERTUSSIS 5-2.5-18.5 LF-MCG/0.5 IM SUSY
0.5000 mL | PREFILLED_SYRINGE | Freq: Once | INTRAMUSCULAR | Status: AC
  Administered 2023-10-10: 0.5 mL via INTRAMUSCULAR
  Filled 2023-10-10: qty 0.5

## 2023-10-10 NOTE — ED Notes (Signed)
Family is bedside

## 2023-10-10 NOTE — Telephone Encounter (Signed)
 Please okay these orders  ?
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
## 2023-10-10 NOTE — ED Notes (Signed)
 PT taken to the bathroom,cleaned up, clothes and bed changed out
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
## 2023-10-10 NOTE — Telephone Encounter (Signed)
 Spoke with [name] advised that VO requested were approved by Dr Johnny, [name] stated that she did not request for any VO. Provided me with a number for the RN covering [place] names Olam Mace [phone], attempted to lisa with approved orders with no success of speaking with Olam. Will try again later.

## 2023-10-10 NOTE — ED Notes (Signed)
 PT could not produce a urine at this time

## 2023-10-10 NOTE — Telephone Encounter (Unsigned)
 Copied from CRM [id number]. Topic: Clinical - Home Health Verbal Orders >> Oct 06, 2023 10:19 AM Charolett CROME wrote: Caller/Agency: [facility] Callback Number: [phone] Service Requested: Occupational Therapy and Physical Therapy Frequency: unknown Any new concerns about the patient? No

## 2023-10-10 NOTE — ED Notes (Signed)
 PT urinated about into the urinal, bladder scan was showing about 

## 2023-10-11 ENCOUNTER — Emergency Department (HOSPITAL_COMMUNITY)

## 2023-10-11 ENCOUNTER — Other Ambulatory Visit: Payer: Self-pay | Admitting: Family Medicine

## 2023-10-11 DIAGNOSIS — G3183 Dementia with Lewy bodies: Secondary | ICD-10-CM | POA: Diagnosis present

## 2023-10-11 DIAGNOSIS — G9341 Metabolic encephalopathy: Secondary | ICD-10-CM | POA: Diagnosis present

## 2023-10-11 DIAGNOSIS — Z8673 Personal history of transient ischemic attack (TIA), and cerebral infarction without residual deficits: Secondary | ICD-10-CM | POA: Diagnosis not present

## 2023-10-11 DIAGNOSIS — Z781 Physical restraint status: Secondary | ICD-10-CM | POA: Diagnosis not present

## 2023-10-11 DIAGNOSIS — G9389 Other specified disorders of brain: Secondary | ICD-10-CM | POA: Diagnosis not present

## 2023-10-11 DIAGNOSIS — Z66 Do not resuscitate: Secondary | ICD-10-CM | POA: Diagnosis present

## 2023-10-11 DIAGNOSIS — F0280 Dementia in other diseases classified elsewhere without behavioral disturbance: Secondary | ICD-10-CM | POA: Diagnosis not present

## 2023-10-11 DIAGNOSIS — S0101XA Laceration without foreign body of scalp, initial encounter: Secondary | ICD-10-CM | POA: Diagnosis present

## 2023-10-11 DIAGNOSIS — G20A1 Parkinson's disease without dyskinesia, without mention of fluctuations: Secondary | ICD-10-CM | POA: Diagnosis present

## 2023-10-11 DIAGNOSIS — F02811 Dementia in other diseases classified elsewhere, unspecified severity, with agitation: Secondary | ICD-10-CM | POA: Diagnosis present

## 2023-10-11 DIAGNOSIS — Z85820 Personal history of malignant melanoma of skin: Secondary | ICD-10-CM | POA: Diagnosis not present

## 2023-10-11 DIAGNOSIS — Z87891 Personal history of nicotine dependence: Secondary | ICD-10-CM | POA: Diagnosis not present

## 2023-10-11 DIAGNOSIS — Z8669 Personal history of other diseases of the nervous system and sense organs: Secondary | ICD-10-CM | POA: Diagnosis not present

## 2023-10-11 DIAGNOSIS — F03918 Unspecified dementia, unspecified severity, with other behavioral disturbance: Secondary | ICD-10-CM | POA: Diagnosis not present

## 2023-10-11 DIAGNOSIS — Z888 Allergy status to other drugs, medicaments and biological substances status: Secondary | ICD-10-CM | POA: Diagnosis not present

## 2023-10-11 DIAGNOSIS — N179 Acute kidney failure, unspecified: Secondary | ICD-10-CM | POA: Diagnosis present

## 2023-10-11 DIAGNOSIS — Z23 Encounter for immunization: Secondary | ICD-10-CM | POA: Diagnosis present

## 2023-10-11 DIAGNOSIS — I10 Essential (primary) hypertension: Secondary | ICD-10-CM | POA: Diagnosis present

## 2023-10-11 DIAGNOSIS — F02818 Dementia in other diseases classified elsewhere, unspecified severity, with other behavioral disturbance: Secondary | ICD-10-CM | POA: Diagnosis present

## 2023-10-11 DIAGNOSIS — S0003XA Contusion of scalp, initial encounter: Secondary | ICD-10-CM | POA: Diagnosis not present

## 2023-10-11 DIAGNOSIS — R4689 Other symptoms and signs involving appearance and behavior: Secondary | ICD-10-CM | POA: Diagnosis present

## 2023-10-11 DIAGNOSIS — R4182 Altered mental status, unspecified: Secondary | ICD-10-CM | POA: Diagnosis present

## 2023-10-11 DIAGNOSIS — Z9181 History of falling: Secondary | ICD-10-CM | POA: Diagnosis not present

## 2023-10-11 DIAGNOSIS — W19XXXA Unspecified fall, initial encounter: Secondary | ICD-10-CM | POA: Diagnosis present

## 2023-10-11 DIAGNOSIS — E785 Hyperlipidemia, unspecified: Secondary | ICD-10-CM | POA: Diagnosis present

## 2023-10-11 DIAGNOSIS — D62 Acute posthemorrhagic anemia: Secondary | ICD-10-CM | POA: Diagnosis present

## 2023-10-11 DIAGNOSIS — Z515 Encounter for palliative care: Secondary | ICD-10-CM | POA: Diagnosis not present

## 2023-10-11 DIAGNOSIS — G9340 Encephalopathy, unspecified: Secondary | ICD-10-CM | POA: Diagnosis not present

## 2023-10-11 DIAGNOSIS — G40909 Epilepsy, unspecified, not intractable, without status epilepticus: Secondary | ICD-10-CM | POA: Diagnosis present

## 2023-10-11 DIAGNOSIS — R410 Disorientation, unspecified: Secondary | ICD-10-CM | POA: Diagnosis not present

## 2023-10-11 DIAGNOSIS — R220 Localized swelling, mass and lump, head: Secondary | ICD-10-CM | POA: Diagnosis not present

## 2023-10-11 DIAGNOSIS — S41111A Laceration without foreign body of right upper arm, initial encounter: Secondary | ICD-10-CM | POA: Diagnosis present

## 2023-10-11 DIAGNOSIS — Z85828 Personal history of other malignant neoplasm of skin: Secondary | ICD-10-CM | POA: Diagnosis not present

## 2023-10-11 DIAGNOSIS — Z79899 Other long term (current) drug therapy: Secondary | ICD-10-CM | POA: Diagnosis not present

## 2023-10-11 DIAGNOSIS — R296 Repeated falls: Secondary | ICD-10-CM | POA: Diagnosis present

## 2023-10-11 LAB — URINALYSIS, W/ REFLEX TO CULTURE (INFECTION SUSPECTED)
Bilirubin Urine: NEGATIVE
Glucose, UA: NEGATIVE mg/dL
Hgb urine dipstick: NEGATIVE
Ketones, ur: NEGATIVE mg/dL
Leukocytes,Ua: NEGATIVE
Nitrite: NEGATIVE
Protein, ur: NEGATIVE mg/dL
Specific Gravity, Urine: 1.024 (ref 1.005–1.030)
pH: 5 (ref 5.0–8.0)

## 2023-10-11 LAB — PROCALCITONIN: Procalcitonin: <0.1 ng/mL

## 2023-10-11 MED ORDER — DONEPEZIL HCL 10 MG PO TABS
5.0000 mg | ORAL_TABLET | Freq: Every day | ORAL | Status: DC
  Administered 2023-10-11 – 2023-10-23 (×13): 5 mg via ORAL
  Filled 2023-10-11 (×13): qty 1

## 2023-10-11 MED ORDER — LEVETIRACETAM (KEPPRA) 500 MG/5 ML ADULT IV PUSH
1000.0000 mg | Freq: Once | INTRAVENOUS | Status: AC
  Administered 2023-10-11: 1000 mg via INTRAVENOUS
  Filled 2023-10-11: qty 10

## 2023-10-11 MED ORDER — TAMSULOSIN HCL 0.4 MG PO CAPS
0.4000 mg | ORAL_CAPSULE | Freq: Every day | ORAL | Status: DC
  Administered 2023-10-11 – 2023-10-24 (×14): 0.4 mg via ORAL
  Filled 2023-10-11 (×14): qty 1

## 2023-10-11 MED ORDER — LISINOPRIL 20 MG PO TABS
20.0000 mg | ORAL_TABLET | Freq: Every day | ORAL | Status: DC
  Administered 2023-10-11: 20 mg via ORAL
  Filled 2023-10-11 (×2): qty 1

## 2023-10-11 MED ORDER — RISPERIDONE 0.5 MG PO TABS: 0.5000 mg | ORAL_TABLET | Freq: Two times a day (BID) | ORAL | Status: DC

## 2023-10-11 MED ORDER — ROSUVASTATIN CALCIUM 5 MG PO TABS
5.0000 mg | ORAL_TABLET | Freq: Every day | ORAL | Status: DC
  Administered 2023-10-11 – 2023-10-24 (×14): 5 mg via ORAL
  Filled 2023-10-11 (×15): qty 1

## 2023-10-11 MED ORDER — HALOPERIDOL LACTATE 5 MG/ML IJ SOLN
5.0000 mg | Freq: Once | INTRAMUSCULAR | Status: AC
  Administered 2023-10-11: 5 mg via INTRAMUSCULAR
  Filled 2023-10-11: qty 1

## 2023-10-11 MED ORDER — SODIUM CHLORIDE 0.9 % IV SOLN
1.0000 g | INTRAVENOUS | Status: DC
  Administered 2023-10-11 – 2023-10-12 (×2): 1 g via INTRAVENOUS
  Filled 2023-10-11 (×2): qty 10

## 2023-10-11 MED ORDER — TRAZODONE HCL 50 MG PO TABS
50.0000 mg | ORAL_TABLET | Freq: Every day | ORAL | Status: DC
  Administered 2023-10-12 – 2023-10-23 (×12): 50 mg via ORAL
  Filled 2023-10-11 (×12): qty 1

## 2023-10-11 MED ORDER — QUETIAPINE FUMARATE 25 MG PO TABS
25.0000 mg | ORAL_TABLET | Freq: Every day | ORAL | Status: DC
  Administered 2023-10-11: 50 mg via ORAL
  Filled 2023-10-11: qty 2

## 2023-10-11 MED ORDER — SODIUM CHLORIDE 0.9 % IV SOLN: INTRAVENOUS | Status: DC

## 2023-10-11 MED ORDER — LEVETIRACETAM 500 MG PO TABS
750.0000 mg | ORAL_TABLET | Freq: Two times a day (BID) | ORAL | Status: DC
  Administered 2023-10-11 – 2023-10-24 (×26): 750 mg via ORAL
  Filled 2023-10-11 (×29): qty 1

## 2023-10-11 NOTE — ED Notes (Signed)
 Family at bedside and requesting MD. MD notified.
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
## 2023-10-11 NOTE — ED Notes (Signed)
 CCMD called.

## 2023-10-11 NOTE — ED Notes (Signed)
 Sitter remains at bedside

## 2023-10-11 NOTE — ED Notes (Signed)
 RN was getting report when pt stood up. Rn asked pt to sit down in the bed. Pt sat down . Pt attempted to close the door Rn asked pt not to close the door. Pt then stood up again Rn asked pt to sit down in the bed again. Pt then lowered himself to the floor and stated I can't get up. Green pod team assisted in getting pt up off the floor. Pt ambulated into the bed assisted by 2 RNS. Pt on a Bed alarm. Primary nurse notified.

## 2023-10-11 NOTE — ED Notes (Signed)
 Awaiting verification for [id number]

## 2023-10-11 NOTE — ED Notes (Signed)
 Patient extremely agitated during initial assessment by this RN. Patient with non-slip socks on, fall mats placed, sitter at bedside. MD notified.

## 2023-10-11 NOTE — ED Notes (Signed)
 Patient placed in safety mitts. MD

## 2023-10-11 NOTE — ED Notes (Signed)
 MD called due to pt agitation and aggression.
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
## 2023-10-11 NOTE — ED Notes (Signed)
 Patient placed on cardiac monitoring. Patient continues pulling at lines and removing leads. Consulting civil engineer notified.

## 2023-10-11 NOTE — ED Notes (Signed)
 Patient placed in safety mitts. MD notified and aware.

## 2023-10-12 ENCOUNTER — Encounter (HOSPITAL_COMMUNITY): Payer: Self-pay | Admitting: Internal Medicine

## 2023-10-12 DIAGNOSIS — I10 Essential (primary) hypertension: Secondary | ICD-10-CM | POA: Diagnosis not present

## 2023-10-12 DIAGNOSIS — Z8669 Personal history of other diseases of the nervous system and sense organs: Secondary | ICD-10-CM

## 2023-10-12 DIAGNOSIS — G20A1 Parkinson's disease without dyskinesia, without mention of fluctuations: Secondary | ICD-10-CM

## 2023-10-12 DIAGNOSIS — D62 Acute posthemorrhagic anemia: Secondary | ICD-10-CM | POA: Diagnosis not present

## 2023-10-12 DIAGNOSIS — N179 Acute kidney failure, unspecified: Secondary | ICD-10-CM | POA: Diagnosis not present

## 2023-10-12 DIAGNOSIS — F0280 Dementia in other diseases classified elsewhere without behavioral disturbance: Secondary | ICD-10-CM

## 2023-10-12 DIAGNOSIS — R4182 Altered mental status, unspecified: Secondary | ICD-10-CM | POA: Diagnosis not present

## 2023-10-12 LAB — CBC WITH DIFFERENTIAL/PLATELET
Abs Immature Granulocytes: 0.05 K/uL (ref 0.00–0.07)
Basophils Absolute: 0 K/uL (ref 0.0–0.1)
Basophils Relative: 0 %
Eosinophils Absolute: 0.1 K/uL (ref 0.0–0.5)
Eosinophils Relative: 1 %
HCT: 34.5 % — ABNORMAL LOW (ref 39.0–52.0)
Hemoglobin: 11.6 g/dL — ABNORMAL LOW (ref 13.0–17.0)
Immature Granulocytes: 1 %
Lymphocytes Relative: 11 %
Lymphs Abs: 1.1 K/uL (ref 0.7–4.0)
MCH: 30.9 pg (ref 26.0–34.0)
MCHC: 33.6 g/dL (ref 30.0–36.0)
MCV: 92 fL (ref 80.0–100.0)
Monocytes Absolute: 0.8 K/uL (ref 0.1–1.0)
Monocytes Relative: 9 %
Neutro Abs: 7.7 K/uL (ref 1.7–7.7)
Neutrophils Relative %: 78 %
Platelets: 295 K/uL (ref 150–400)
RBC: 3.75 MIL/uL — ABNORMAL LOW (ref 4.22–5.81)
RDW: 13.1 % (ref 11.5–15.5)
WBC: 9.8 K/uL (ref 4.0–10.5)
nRBC: 0 % (ref 0.0–0.2)

## 2023-10-12 LAB — URINE CULTURE: Culture: <10000 — AB

## 2023-10-12 LAB — BASIC METABOLIC PANEL WITH GFR
Anion gap: 12 (ref 5–15)
BUN: 13 mg/dL (ref 8–23)
CO2: 22 mmol/L (ref 22–32)
Calcium: 8.5 mg/dL — ABNORMAL LOW (ref 8.9–10.3)
Chloride: 106 mmol/L (ref 98–111)
Creatinine, Ser: 1.16 mg/dL (ref 0.61–1.24)
GFR, Estimated: >60 mL/min (ref >60)
Glucose, Bld: 92 mg/dL (ref 70–99)
Potassium: 3.6 mmol/L (ref 3.5–5.1)
Sodium: 140 mmol/L (ref 135–145)

## 2023-10-12 LAB — LEVETIRACETAM LEVEL: Levetiracetam Lvl: 33.1 ug/mL (ref 10.0–40.0)

## 2023-10-12 MED ORDER — ACETAMINOPHEN 325 MG PO TABS
650.0000 mg | ORAL_TABLET | Freq: Four times a day (QID) | ORAL | Status: DC | PRN
  Administered 2023-10-13 – 2023-10-16 (×3): 650 mg via ORAL
  Filled 2023-10-12 (×3): qty 2

## 2023-10-12 MED ORDER — ONDANSETRON HCL 4 MG/2ML IJ SOLN: 4.0000 mg | Freq: Four times a day (QID) | INTRAMUSCULAR | Status: DC | PRN

## 2023-10-12 MED ORDER — QUETIAPINE FUMARATE 25 MG PO TABS
50.0000 mg | ORAL_TABLET | Freq: Two times a day (BID) | ORAL | Status: DC
  Administered 2023-10-12 – 2023-10-24 (×24): 50 mg via ORAL
  Filled 2023-10-12 (×25): qty 2

## 2023-10-12 MED ORDER — ONDANSETRON HCL 4 MG PO TABS: 4.0000 mg | ORAL_TABLET | Freq: Four times a day (QID) | ORAL | Status: DC | PRN

## 2023-10-12 MED ORDER — ACETAMINOPHEN 650 MG RE SUPP: 650.0000 mg | Freq: Four times a day (QID) | RECTAL | Status: DC | PRN

## 2023-10-12 MED ORDER — METOPROLOL TARTRATE 25 MG PO TABS
25.0000 mg | ORAL_TABLET | Freq: Two times a day (BID) | ORAL | Status: DC
  Administered 2023-10-12 – 2023-10-13 (×3): 25 mg via ORAL
  Filled 2023-10-12 (×4): qty 1

## 2023-10-12 NOTE — Assessment & Plan Note (Addendum)
 10-12-2023 admission Scr of 1.48. after IVF, Scr down to 1.16 today. Assess to see how much food/liquids pt can drink. Then stop IVF.  10-13-2023 stop Ivf. Resolved.

## 2023-10-12 NOTE — Assessment & Plan Note (Addendum)
 10-12-2023 stop ACEI due to recent AKI. Probably ideal for him not to be on meds that effect rena function. Start lopressor  25 mg bid.  10-13-2023 continue lopressor  25 mg bid.  10-14-2023 lopressor  increased to 37.5 mg bid.

## 2023-10-12 NOTE — Plan of Care (Signed)

## 2023-10-12 NOTE — Assessment & Plan Note (Addendum)
 10-12-2023 pt recently seen by PCP on 09-25-2023. PCP notes that family looking into memory care unit in [place] [facility] Have asked CM to reach out to family to see where they are in the admission process to this memory care unit.  10-13-2023 rapidly progressive dementia with worsening decline. Wife wants him to go to SNF. He confirms he is a DNR/DNI. Will refer patient to hospice to help wife with end-of-life journey. Wife states she met pt when she was 3 yo. Continue Seroquel .   10-14-2023 recent worsening of his dementia. Rapidly progressive. Pt is DNR/DNI.  10-15-2023 chronic dementia. Worsening recently. Continue with bid seroquel , aricept  at bedtime, trazodone  at bedtime.  10-16-2023 pt at his baseline confusion/dementia. Remains on BID seroquel . Prn ativan . CM to call SNF today to see if there is a bed available for him to transfer. Events of last night noted that wife may want to remove pt AMA.   10-17-2023 at his neuro baseline of dementia. Continue bid seroquel .
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
## 2023-10-12 NOTE — Assessment & Plan Note (Addendum)
 10-12-2023 due to his scalp laceration. Stable.  10-15-2023 HgB 11.5 g/dl. Stable.

## 2023-10-12 NOTE — Plan of Care (Signed)

## 2023-10-12 NOTE — Subjective & Objective (Addendum)
 Pt seen and examined. Stable overnight. Remains without restraints. Afebrile.  Original SNF that wife was hoping for has refused admission. CM aware and working with pt's wife.

## 2023-10-12 NOTE — Evaluation (Signed)
 Physical Therapy Evaluation Patient Details Name: Derek Cox. MRN: [id number] DOB: 1945/09/15 Today's Date: 10/12/2023  History of Present Illness  Pt is a 78 y.o. male admitted 7/15 with AMS, combative behavior. PMH:  dementia, HTN, GBS, BPH, seizures  Clinical Impression  Pt admitted with above diagnosis. PTA pt lived at home with wife, requiring assist for ADLs and household amb with RW. Pt currently with functional limitations due to the deficits listed below (see PT Problem List). On eval, pt required max assist supine to sit, mod assist sit to supine, and mod assist sit to stand with RW. Unable to progress gait due to heavy posterior lean in stance. Poor sitting and standing balance. Pt required sedation meds in ED due to combative behavior. He continues to present with decreased level of arousal, affecting his participation. Wrist restraints still in place. Pt very calm and cooperative during PT eval, but overall lethargic. Pt will benefit from acute skilled PT to increase their independence and safety with mobility to allow discharge. Post acute, pt would benefit from further therapy in inpatient setting < 3 hours/day.         If plan is discharge home, recommend the following: Two people to help with walking and/or transfers;Two people to help with bathing/dressing/bathroom   Can travel by private vehicle   No    Equipment Recommendations None recommended by PT  Recommendations for Other Services       Functional Status Assessment Patient has had a recent decline in their functional status and/or demonstrates limited ability to make significant improvements in function in a reasonable and predictable amount of time     Precautions / Restrictions Precautions Precautions: Fall Recall of Precautions/Restrictions: Intact Precaution/Restrictions Comments: recent h/o frequent falls      Mobility  Bed Mobility Overal bed mobility: Needs Assistance Bed Mobility: Supine  to Sit, Sit to Supine     Supine to sit: HOB elevated, Used rails, Max assist Sit to supine: Mod assist   General bed mobility comments: continuous verbal cues for sequencing, increased time, assist with trunk and BLE. Suspect higher level of assist needed due to arousal level.    Transfers Overall transfer level: Needs assistance Equipment used: Rolling walker (2 wheels) Transfers: Sit to/from Stand Sit to Stand: Mod assist           General transfer comment: Assist to power up and stabilize balance. Heavy posterior lean, but improved from 1st standing trial to second.    Ambulation/Gait             Pre-gait activities: marching in place with RW on 2nd standing trial General Gait Details: unable to progress due to heavy posterior lean  Stairs            Wheelchair Mobility     Tilt Bed    Modified Rankin (Stroke Patients Only)       Balance Overall balance assessment: Needs assistance, History of Falls Sitting-balance support: Feet supported, Bilateral upper extremity supported Sitting balance-Leahy Scale: Poor   Postural control: Posterior lean Standing balance support: Bilateral upper extremity supported, During functional activity, Reliant on assistive device for balance Standing balance-Leahy Scale: Poor                               Pertinent Vitals/Pain Pain Assessment Pain Assessment: Faces Faces Pain Scale: No hurt    Home Living Family/patient expects to be discharged to:: Private residence Living  Arrangements: Spouse/significant other Available Help at Discharge: Family;Available 24 hours/day Type of Home: House Home Access: Stairs to enter Entrance Stairs-Rails: Doctor, general practice of Steps: 3   Home Layout: One level Home Equipment: Rollator (4 wheels);Rolling Walker (2 wheels);Cane - single point      Prior Function Prior Level of Function : Needs assist;History of Falls (last six months)              Mobility Comments: amb with RW vs HHA from family ADLs Comments: family assists with ADLs     Extremity/Trunk Assessment   Upper Extremity Assessment Upper Extremity Assessment: Generalized weakness    Lower Extremity Assessment Lower Extremity Assessment: Generalized weakness    Cervical / Trunk Assessment Cervical / Trunk Assessment: Kyphotic  Communication   Communication Communication: No apparent difficulties    Cognition Arousal: Lethargic, Suspect due to medications Behavior During Therapy: WFL for tasks assessed/performed   PT - Cognitive impairments: History of cognitive impairments, Awareness, Memory, Attention, Initiation, Sequencing, Problem solving, Safety/Judgement, Orientation   Orientation impairments: Place, Time, Situation                     Following commands: Impaired Following commands impaired: Follows one step commands inconsistently     Cueing Cueing Techniques: Verbal cues, Visual cues, Tactile cues     General Comments      Exercises     Assessment/Plan    PT Assessment Patient needs continued PT services  PT Problem List Decreased strength;Decreased activity tolerance;Decreased balance;Decreased mobility;Decreased coordination;Decreased cognition;Decreased knowledge of use of DME;Decreased safety awareness       PT Treatment Interventions DME instruction;Gait training;Stair training;Functional mobility training;Therapeutic activities;Therapeutic exercise;Balance training;Cognitive remediation;Patient/family education    PT Goals (Current goals can be found in the Care Plan section)  Acute Rehab PT Goals Patient Stated Goal: unable to state PT Goal Formulation: With patient/family Time For Goal Achievement: 10/26/23 Potential to Achieve Goals: Fair    Frequency Min 2X/week     Co-evaluation               AM-PAC PT 6 Clicks Mobility  Outcome Measure Help needed turning from your back to your side while  in a flat bed without using bedrails?: A Lot Help needed moving from lying on your back to sitting on the side of a flat bed without using bedrails?: A Lot Help needed moving to and from a bed to a chair (including a wheelchair)?: A Lot Help needed standing up from a chair using your arms (e.g., wheelchair or bedside chair)?: A Lot Help needed to walk in hospital room?: Total Help needed climbing 3-5 steps with a railing? : Total 6 Click Score: 10    End of Session Equipment Utilized During Treatment: Gait belt Activity Tolerance: Patient tolerated treatment well Patient left: in bed;with call bell/phone within reach;with restraints reapplied;with family/visitor present;with bed alarm set Nurse Communication: Mobility status PT Visit Diagnosis: Unsteadiness on feet (R26.81);Muscle weakness (generalized) (M62.81);History of falling (Z91.81)    Time: 8899-8872 PT Time Calculation (min) (ACUTE ONLY): 27 min   Charges:   PT Evaluation $PT Eval Moderate Complexity: 1 Mod PT Treatments $Therapeutic Activity: 8-22 mins PT General Charges $$ ACUTE PT VISIT: 1 Visit         Sari MATSU., PT  Office # [phone]   Erven Sari Shaker 10/12/2023, 12:17 PM

## 2023-10-12 NOTE — Assessment & Plan Note (Addendum)
 10-12-2023 unclear if this AMS/acute metabolic encephalopathy due to AKI vs his known dementia.  His AKI has resolved. He is still occ agitated. Will increase seroquel  to 50 mg bid.  10-13-2023 rapidly progressive dementia with worsening decline. Wife wants him to go to SNF. He confirms he is a DNR/DNI. Will refer patient to hospice to help wife with end-of-life journey. Wife states she met pt when she was 78 yo.  10-14-2023 awaiting SNF placement. Medically stable for DC.  10-15-2023 pt still confused. I think that this is his baseline dementia rather than acute mental status changes. Hopefully can go to SNF tomorrow. Ultimately, I think pt will need memory care unit.  10-16-2023 pt at his baseline confusion/dementia. Remains on BID seroquel . Prn ativan . CM to call SNF today to see if there is a bed available for him to transfer. Events of last night noted that wife may want to remove pt AMA.  10-17-2023 at his neuro baseline of dementia.

## 2023-10-12 NOTE — Plan of Care (Signed)
  Problem: Safety: Goal: Non-violent Restraint(s) 10/12/2023 0125 by Gerrie Merrianne HERO, RN Outcome: Progressing 10/12/2023 0125 by Gerrie Merrianne HERO, RN Outcome: Progressing   Problem: Education: Goal: Knowledge of General Education information will improve Description: Including pain rating scale, medication(s)/side effects and non-pharmacologic comfort measures 10/12/2023 0125 by Gerrie Merrianne HERO, RN Outcome: Progressing 10/12/2023 0125 by Gerrie Merrianne HERO, RN Outcome: Progressing   Problem: Health Behavior/Discharge Planning: Goal: Ability to manage health-related needs will improve 10/12/2023 0125 by Gerrie Merrianne HERO, RN Outcome: Progressing 10/12/2023 0125 by Gerrie Merrianne HERO, RN Outcome: Progressing   Problem: Clinical Measurements: Goal: Ability to maintain clinical measurements within normal limits will improve 10/12/2023 0125 by Gerrie Merrianne HERO, RN Outcome: Progressing 10/12/2023 0125 by Gerrie Merrianne HERO, RN Outcome: Progressing Goal: Will remain free from infection 10/12/2023 0125 by Gerrie Merrianne HERO, RN Outcome: Progressing 10/12/2023 0125 by Gerrie Merrianne HERO, RN Outcome: Progressing Goal: Diagnostic test results will improve 10/12/2023 0125 by Gerrie Merrianne HERO, RN Outcome: Progressing 10/12/2023 0125 by Gerrie Merrianne HERO, RN Outcome: Progressing Goal: Respiratory complications will improve 10/12/2023 0125 by Gerrie Merrianne HERO, RN Outcome: Progressing 10/12/2023 0125 by Gerrie Merrianne HERO, RN Outcome: Progressing Goal: Cardiovascular complication will be avoided 10/12/2023 0125 by Gerrie Merrianne HERO, RN Outcome: Progressing 10/12/2023 0125 by Gerrie Merrianne HERO, RN Outcome: Progressing   Problem: Activity: Goal: Risk for activity intolerance will decrease 10/12/2023 0125 by Gerrie Merrianne HERO, RN Outcome: Progressing 10/12/2023 0125 by Gerrie Merrianne HERO, RN Outcome: Progressing   Problem: Nutrition: Goal: Adequate nutrition will be maintained 10/12/2023 0125 by Gerrie Merrianne HERO, RN Outcome: Progressing 10/12/2023  0125 by Gerrie Merrianne HERO, RN Outcome: Progressing   Problem: Coping: Goal: Level of anxiety will decrease 10/12/2023 0125 by Gerrie Merrianne HERO, RN Outcome: Progressing 10/12/2023 0125 by Gerrie Merrianne HERO, RN Outcome: Progressing   Problem: Elimination: Goal: Will not experience complications related to bowel motility 10/12/2023 0125 by Gerrie Merrianne HERO, RN Outcome: Progressing 10/12/2023 0125 by Gerrie Merrianne HERO, RN Outcome: Progressing Goal: Will not experience complications related to urinary retention 10/12/2023 0125 by Gerrie Merrianne HERO, RN Outcome: Progressing 10/12/2023 0125 by Gerrie Merrianne HERO, RN Outcome: Progressing   Problem: Pain Managment: Goal: General experience of comfort will improve and/or be controlled 10/12/2023 0125 by Gerrie Merrianne HERO, RN Outcome: Progressing 10/12/2023 0125 by Gerrie Merrianne HERO, RN Outcome: Progressing   Problem: Safety: Goal: Ability to remain free from injury will improve Outcome: Progressing   Problem: Skin Integrity: Goal: Risk for impaired skin integrity will decrease Outcome: Progressing

## 2023-10-12 NOTE — Assessment & Plan Note (Addendum)
 10-12-2023 stable. On keppra .  10-13-2023 continue keppra   10-14-2023 continue keppra   10-15-2023 on keppra   10-16-2023 on keppra .  10-17-2023 stable on keppra  750 mg bid.

## 2023-10-12 NOTE — TOC Initial Note (Addendum)
 Transition of Care (TOC) - Initial/Assessment Note    Patient Details  Name: Derek Cox. MRN: [id number] Date of Birth: [birth year]  [facility]) CM/SW Contact:    Luise JAYSON Pan, LCSWA Phone Number: 10/12/2023, 2:43 PM  Clinical Narrative:    CSW spoke with patients daughter and spouse about PT recs for STR. They are unsure of plans for discharge at this time and have not made a decision. [name] stated she would like for CSW to follow up at a later time regarding plans for discharge.   [name] stated patient is from home with her. She has placed patient on a waitlist at [facility] [place], and she would like him to go to their SNF if that ends up being the plan.   CSW contacted [facility] to get information on how to send new patient referral for SNF. Contact for SNF admissions is Luke [phone]). CSW left VM at this time.   3:10 PM Kim called CSW back and stated a referral can be sent to [facility] in the [place]. CSW informed facility that CSW will follow up once wife makes a decision on if patient will got to SNF.   TOC will continue to follow.     Expected Discharge Plan:  (TBD) Barriers to Discharge: Continued Medical Work up   Patient Goals and CMS Choice Patient states their goals for this hospitalization and ongoing recovery are:: Unable to assess          Expected Discharge Plan and Services In-house Referral: Clinical Social Work     Living arrangements for the past 2 months: Single Family Home                                      Prior Living Arrangements/Services Living arrangements for the past 2 months: Single Family Home Lives with:: Spouse Patient language and need for interpreter reviewed:: No Do you feel safe going back to the place where you live?: Yes      Need for Family Participation in Patient Care: Yes (Comment) Care giver support system in place?: Yes (comment)   Criminal Activity/Legal  Involvement Pertinent to Current Situation/Hospitalization: No - Comment as needed  Activities of Daily Living      Permission Sought/Granted Permission sought to share information with : Family Supports Permission granted to share information with : No (Patientis not fully oriented, family contact in chart)  Share Information with NAME: [name], ,[name]     Permission granted to share info w Relationship: Spouse, daughter  Permission granted to share info w Contact Information: [phone], [phone]  Emotional Assessment Appearance:: Appears stated age Attitude/Demeanor/Rapport: Unable to Assess Affect (typically observed): Unable to Assess Orientation: : Oriented to Self Alcohol / Substance Use: Not Applicable Psych Involvement: No (comment)  Admission diagnosis:  Aggressive behavior [R46.89] AKI (acute kidney injury) (HCC) [N17.9] Laceration of scalp, initial encounter [S01.01XA] Fall in home, initial encounter [W19.CHERENE, Y92.009] AMS (altered mental status) [R41.82] Patient Active Problem List   Diagnosis Date Noted   AKI (acute kidney injury) (HCC) 10/12/2023   Acute blood loss anemia (ABLA) 10/12/2023   AMS (altered mental status) 10/11/2023   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 [place] [address] Phone: [phone] Fax: [phone]  [facility] [place] - [address] [place] [address] Phone: [phone] Fax: [phone]     Social Drivers of Health (SDOH) Social History: SDOH Screenings   Food Insecurity: No Food Insecurity (10/12/2023)  Housing: Low Risk  (10/12/2023)  Transportation Needs: Unknown (10/12/2023)  Utilities: Not At Risk (10/12/2023)  Depression (PHQ2-9): Low Risk  (04/12/2021)  Financial Resource Strain: Low Risk  (07/28/2023)   Received from [facility]  Physical Activity: Inactive (04/12/2021)  Social Connections: Moderately Isolated (10/12/2023)  Stress: No Stress Concern Present (04/12/2021)  Tobacco Use: Medium Risk (10/10/2023)   SDOH Interventions:     Readmission Risk Interventions     No data to display

## 2023-10-12 NOTE — Assessment & Plan Note (Addendum)
 10-12-2023 chronic. Has seen Dr. Norleen Blumenthal with [facility] neurology. 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 Definitely not routine parkinson's disease. No indication for Sinemet .  10-13-2023 worsening dementia.  rapidly progressive dementia with worsening decline. Wife wants him to go to SNF. He confirms he is a DNR/DNI. Will refer patient to hospice to help wife with end-of-life journey.   10-14-2023 recent worsening of his dementia. Rapidly progressive. Pt is DNR/DNI. Wife understands that further workup will not change his overall poor prognosis.  10-15-2023 recent evaluation by outpatient neurology. Not much can be done for him.  10-16-2023 pt at his baseline confusion/dementia   10-17-2023  at his neuro baseline of dementia/confusion.

## 2023-10-13 DIAGNOSIS — N179 Acute kidney failure, unspecified: Secondary | ICD-10-CM | POA: Diagnosis not present

## 2023-10-13 DIAGNOSIS — I10 Essential (primary) hypertension: Secondary | ICD-10-CM | POA: Diagnosis not present

## 2023-10-13 DIAGNOSIS — Z66 Do not resuscitate: Secondary | ICD-10-CM

## 2023-10-13 DIAGNOSIS — G20A1 Parkinson's disease without dyskinesia, without mention of fluctuations: Secondary | ICD-10-CM | POA: Diagnosis not present

## 2023-10-13 DIAGNOSIS — R4182 Altered mental status, unspecified: Secondary | ICD-10-CM | POA: Diagnosis not present

## 2023-10-13 LAB — CBC WITH DIFFERENTIAL/PLATELET
Abs Immature Granulocytes: 0.12 K/uL — ABNORMAL HIGH (ref 0.00–0.07)
Basophils Absolute: 0 K/uL (ref 0.0–0.1)
Basophils Relative: 0 %
Eosinophils Absolute: 0.1 K/uL (ref 0.0–0.5)
Eosinophils Relative: 1 %
HCT: 35.4 % — ABNORMAL LOW (ref 39.0–52.0)
Hemoglobin: 11.6 g/dL — ABNORMAL LOW (ref 13.0–17.0)
Immature Granulocytes: 1 %
Lymphocytes Relative: 12 %
Lymphs Abs: 1.5 K/uL (ref 0.7–4.0)
MCH: 30.8 pg (ref 26.0–34.0)
MCHC: 32.8 g/dL (ref 30.0–36.0)
MCV: 93.9 fL (ref 80.0–100.0)
Monocytes Absolute: 1 K/uL (ref 0.1–1.0)
Monocytes Relative: 8 %
Neutro Abs: 9 K/uL — ABNORMAL HIGH (ref 1.7–7.7)
Neutrophils Relative %: 78 %
Platelets: 269 K/uL (ref 150–400)
RBC: 3.77 MIL/uL — ABNORMAL LOW (ref 4.22–5.81)
RDW: 12.6 % (ref 11.5–15.5)
WBC: 11.7 K/uL — ABNORMAL HIGH (ref 4.0–10.5)
nRBC: 0 % (ref 0.0–0.2)

## 2023-10-13 LAB — BASIC METABOLIC PANEL WITH GFR
Anion gap: 9 (ref 5–15)
BUN: 13 mg/dL (ref 8–23)
CO2: 22 mmol/L (ref 22–32)
Calcium: 8.2 mg/dL — ABNORMAL LOW (ref 8.9–10.3)
Chloride: 107 mmol/L (ref 98–111)
Creatinine, Ser: 1.09 mg/dL (ref 0.61–1.24)
GFR, Estimated: >60 mL/min (ref >60)
Glucose, Bld: 91 mg/dL (ref 70–99)
Potassium: 3.7 mmol/L (ref 3.5–5.1)
Sodium: 138 mmol/L (ref 135–145)

## 2023-10-13 MED ORDER — LORAZEPAM 1 MG PO TABS
1.0000 mg | ORAL_TABLET | ORAL | Status: DC | PRN
  Administered 2023-10-13 – 2023-10-21 (×5): 1 mg via ORAL
  Filled 2023-10-13 (×5): qty 1

## 2023-10-13 MED ORDER — HALOPERIDOL LACTATE 5 MG/ML IJ SOLN
5.0000 mg | Freq: Four times a day (QID) | INTRAMUSCULAR | Status: DC | PRN
  Administered 2023-10-13 – 2023-10-15 (×3): 5 mg via INTRAVENOUS
  Filled 2023-10-13 (×3): qty 1

## 2023-10-13 NOTE — Assessment & Plan Note (Addendum)
 10-13-2023 verified with wife that pt is a DNR/DNI.

## 2023-10-13 NOTE — TOC Progression Note (Addendum)
 Transition of Care (TOC) - Progression Note    Patient Details  Name: Derek Cox. MRN: [id number] Date of Birth: 09-26-1945  Transition of Care [facility]) CM/SW Contact  Lois Slagel A [place], LCSW Phone Number: 10/13/2023, 11:22 AM  Clinical Narrative:     Update 1620 CSW spoke with admissions at [facility], [place] stated that though they are familiar with pt's family, as of now, they cannot bed offer as they feel pt would need long term care, and do not have any availability to accommodate that at this time. CSW left VM with pt's wife [name] and updated her with information. CSW to follow up with [facility] Monday when DON is back and can discuss option for bed offer then.   Update 1244 CSW met with pt and pt's wife [name] at bedside. CSW informed her that Luke at [facility] [place] said that until their DON, Dorthea, returns, they are unable to make a decision on bed offer for pt. She said that she is surprised that they cannot offer bed at this time but will wait to hear from them next week regarding decision. She requested CSW send referral to [facility] as well, since her mother is there, and follow up on if they have any openings in ALF or SNF side.   CSW met with pt and pt's wife at bedside. She was agreeable to pt going to SNF.  Pt's wife informed CSW that pt is on a list at [facility] [place] and had a spot in their skilled nursing side and would like pt to be discharged their as soon as possible. Per previous TOC note, pt's referral was sent to [facility] per admissions teams at [facility] guidance. CSW reached out to [place], [phone]  and left VM with contact information to reach back out to CSW regarding referral and bed availability.   Pt has Traditional Medicare A & B, needs 3 midnight stay before can DC unless facility choice is on [facility] Waiver, CSW to confirm with [facility] team.   TOC will continue to follow.   Expected Discharge Plan:  (TBD) Barriers to Discharge:  Continued Medical Work up  Expected Discharge Plan and Services In-house Referral: Clinical Social Work     Living arrangements for the past 2 months: Single Family Home                                       Social Determinants of Health (SDOH) Interventions SDOH Screenings   Food Insecurity: No Food Insecurity (10/12/2023)  Housing: Low Risk  (10/12/2023)  Transportation Needs: Unknown (10/12/2023)  Utilities: Not At Risk (10/12/2023)  Depression (PHQ2-9): Low Risk  (04/12/2021)  Financial Resource Strain: Low Risk  (07/28/2023)   Received from [facility]  Physical Activity: Inactive (04/12/2021)  Social Connections: Moderately Isolated (10/12/2023)  Stress: No Stress Concern Present (04/12/2021)  Tobacco Use: Medium Risk (10/10/2023)    Readmission Risk Interventions     No data to display

## 2023-10-13 NOTE — Telephone Encounter (Signed)
 Spoke with Derek Cox with [facility], states that they have not requested any VO for pt, will call the office back if they need anything
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
## 2023-10-13 NOTE — Care Management Important Message (Signed)
 Important Message  Patient Details  Name: Derek Cox. MRN: [id number] Date of Birth: 1946/03/22   Important Message Given:  Yes - Medicare IM     Claretta Deed 10/13/2023, 4:12 PM

## 2023-10-13 NOTE — Plan of Care (Signed)
  Problem: Safety: Goal: Non-violent Restraint(s) Outcome: Progressing   

## 2023-10-14 DIAGNOSIS — I10 Essential (primary) hypertension: Secondary | ICD-10-CM | POA: Diagnosis not present

## 2023-10-14 DIAGNOSIS — G20A1 Parkinson's disease without dyskinesia, without mention of fluctuations: Secondary | ICD-10-CM | POA: Diagnosis not present

## 2023-10-14 DIAGNOSIS — R4182 Altered mental status, unspecified: Secondary | ICD-10-CM | POA: Diagnosis not present

## 2023-10-14 DIAGNOSIS — D62 Acute posthemorrhagic anemia: Secondary | ICD-10-CM | POA: Diagnosis not present

## 2023-10-14 MED ORDER — METOPROLOL TARTRATE 25 MG PO TABS
37.5000 mg | ORAL_TABLET | Freq: Two times a day (BID) | ORAL | Status: DC
  Administered 2023-10-14 – 2023-10-24 (×21): 37.5 mg via ORAL
  Filled 2023-10-14 (×21): qty 1

## 2023-10-14 MED ORDER — CLONIDINE HCL 0.2 MG/24HR TD PTWK
0.2000 mg | MEDICATED_PATCH | TRANSDERMAL | Status: DC
  Administered 2023-10-14 – 2023-10-21 (×2): 0.2 mg via TRANSDERMAL
  Filled 2023-10-14 (×2): qty 1

## 2023-10-14 NOTE — Plan of Care (Signed)
  Problem: Pain Managment: Goal: General experience of comfort will improve and/or be controlled Outcome: Progressing   Problem: Education: Goal: Knowledge of General Education information will improve Description: Including pain rating scale, medication(s)/side effects and non-pharmacologic comfort measures Outcome: Not Progressing   Problem: Activity: Goal: Risk for activity intolerance will decrease Outcome: Not Progressing

## 2023-10-14 NOTE — Plan of Care (Signed)
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
## 2023-10-15 DIAGNOSIS — R4182 Altered mental status, unspecified: Secondary | ICD-10-CM | POA: Diagnosis not present

## 2023-10-15 DIAGNOSIS — D62 Acute posthemorrhagic anemia: Secondary | ICD-10-CM | POA: Diagnosis not present

## 2023-10-15 DIAGNOSIS — I10 Essential (primary) hypertension: Secondary | ICD-10-CM | POA: Diagnosis not present

## 2023-10-15 DIAGNOSIS — G20A1 Parkinson's disease without dyskinesia, without mention of fluctuations: Secondary | ICD-10-CM | POA: Diagnosis not present

## 2023-10-15 LAB — BASIC METABOLIC PANEL WITH GFR
Anion gap: 10 (ref 5–15)
BUN: 12 mg/dL (ref 8–23)
CO2: 23 mmol/L (ref 22–32)
Calcium: 8.6 mg/dL — ABNORMAL LOW (ref 8.9–10.3)
Chloride: 106 mmol/L (ref 98–111)
Creatinine, Ser: 1.11 mg/dL (ref 0.61–1.24)
GFR, Estimated: >60 mL/min (ref >60)
Glucose, Bld: 107 mg/dL — ABNORMAL HIGH (ref 70–99)
Potassium: 3.6 mmol/L (ref 3.5–5.1)
Sodium: 139 mmol/L (ref 135–145)

## 2023-10-15 LAB — CBC WITH DIFFERENTIAL/PLATELET
Abs Immature Granulocytes: 0.03 K/uL (ref 0.00–0.07)
Basophils Absolute: 0 K/uL (ref 0.0–0.1)
Basophils Relative: 0 %
Eosinophils Absolute: 0.1 K/uL (ref 0.0–0.5)
Eosinophils Relative: 2 %
HCT: 33.3 % — ABNORMAL LOW (ref 39.0–52.0)
Hemoglobin: 11.5 g/dL — ABNORMAL LOW (ref 13.0–17.0)
Immature Granulocytes: 0 %
Lymphocytes Relative: 14 %
Lymphs Abs: 1.2 K/uL (ref 0.7–4.0)
MCH: 31.2 pg (ref 26.0–34.0)
MCHC: 34.5 g/dL (ref 30.0–36.0)
MCV: 90.2 fL (ref 80.0–100.0)
Monocytes Absolute: 0.7 K/uL (ref 0.1–1.0)
Monocytes Relative: 9 %
Neutro Abs: 6.2 K/uL (ref 1.7–7.7)
Neutrophils Relative %: 75 %
Platelets: 285 K/uL (ref 150–400)
RBC: 3.69 MIL/uL — ABNORMAL LOW (ref 4.22–5.81)
RDW: 12.5 % (ref 11.5–15.5)
WBC: 8.3 K/uL (ref 4.0–10.5)
nRBC: 0 % (ref 0.0–0.2)

## 2023-10-15 MED ORDER — HALOPERIDOL LACTATE 5 MG/ML IJ SOLN
5.0000 mg | Freq: Four times a day (QID) | INTRAMUSCULAR | Status: DC | PRN
  Administered 2023-10-21 – 2023-10-23 (×2): 5 mg via INTRAMUSCULAR
  Filled 2023-10-15 (×2): qty 1

## 2023-10-15 NOTE — Progress Notes (Addendum)
 Post Bil Soft Wrist Restraints. Pt noted to have pulled off condom cath, SCD's, gown, blankets & removed IV by self. Pt agitated & tense; BP at this time 175/102. Scheduled Rx's encouraged (including Metoprolol , Trazodone  & Seroquel ); Pt amenable to taking Scheduled Rx's. MD made aware Pt status & IV replaced. BP & agitation improved post Rx admin. Will continue to monitor.
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
## 2023-10-15 NOTE — Progress Notes (Signed)
 Rn just spoke with patient's wife. Wife in tears expressed extreme desire to have patient released tomorrow. She feels as though he is getting cared for here, but he needs more and needs to be moving around. Wife also expressed concern over her legal options regarding the patient leaving AMA if not discharged in the morning.

## 2023-10-16 DIAGNOSIS — R4182 Altered mental status, unspecified: Secondary | ICD-10-CM | POA: Diagnosis not present

## 2023-10-16 DIAGNOSIS — D62 Acute posthemorrhagic anemia: Secondary | ICD-10-CM | POA: Diagnosis not present

## 2023-10-16 DIAGNOSIS — I10 Essential (primary) hypertension: Secondary | ICD-10-CM | POA: Diagnosis not present

## 2023-10-16 DIAGNOSIS — G20A1 Parkinson's disease without dyskinesia, without mention of fluctuations: Secondary | ICD-10-CM | POA: Diagnosis not present

## 2023-10-16 LAB — CULTURE, BLOOD (ROUTINE X 2)
Culture: NO GROWTH
Culture: NO GROWTH
Special Requests: ADEQUATE
Special Requests: ADEQUATE

## 2023-10-16 MED ORDER — ACETAMINOPHEN 325 MG PO TABS: 650.0000 mg | ORAL_TABLET | Freq: Four times a day (QID) | ORAL | Status: DC | PRN

## 2023-10-16 MED ORDER — TRAZODONE HCL 50 MG PO TABS: 50.0000 mg | ORAL_TABLET | Freq: Every evening | ORAL | Status: DC | PRN

## 2023-10-16 MED ORDER — ONDANSETRON HCL 4 MG PO TABS: 4.0000 mg | ORAL_TABLET | Freq: Four times a day (QID) | ORAL | Status: DC | PRN

## 2023-10-16 MED ORDER — TAMSULOSIN HCL 0.4 MG PO CAPS: 0.4000 mg | ORAL_CAPSULE | Freq: Every day | ORAL | Status: DC

## 2023-10-16 MED ORDER — LORAZEPAM 1 MG PO TABS: 1.0000 mg | ORAL_TABLET | Freq: Two times a day (BID) | ORAL | 0 refills | Status: DC | PRN

## 2023-10-16 MED ORDER — METOPROLOL TARTRATE 37.5 MG PO TABS: 37.5000 mg | ORAL_TABLET | Freq: Two times a day (BID) | ORAL | Status: DC

## 2023-10-16 MED ORDER — CLONIDINE 0.2 MG/24HR TD PTWK: 0.2000 mg | MEDICATED_PATCH | TRANSDERMAL | Status: DC

## 2023-10-16 MED ORDER — ENSURE PLUS HIGH PROTEIN PO LIQD
237.0000 mL | Freq: Two times a day (BID) | ORAL | Status: DC
  Administered 2023-10-16 – 2023-10-24 (×17): 237 mL via ORAL

## 2023-10-16 MED ORDER — QUETIAPINE FUMARATE 50 MG PO TABS: 50.0000 mg | ORAL_TABLET | Freq: Two times a day (BID) | ORAL | Status: DC
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
## 2023-10-16 NOTE — Progress Notes (Signed)
 Name: Derek Cox DOB: August 27, 1945  Please be advised that the above-named patient will require a short-term nursing home stay -- anticipated 30 days or less for rehabilitation and strengthening. The plan is for return home.

## 2023-10-16 NOTE — Plan of Care (Signed)

## 2023-10-16 NOTE — TOC Progression Note (Addendum)
 Transition of Care (TOC) - Progression Note    Patient Details  Name: Derek Cox. MRN: [id number] Date of Birth: 10/09/1945  Transition of [facility]) CM/SW Contact  Ranell Skibinski A [place], LCSW Phone Number: 10/16/2023, 1:18 PM  Clinical Narrative:     Update 1430 CSW was reached back out to by Luke at [facility]. She said that they do not feel that they can offer a bed to pt and do want to want pursue placement. She said that [place] or family can follow up with the DON [place] and have a conversation regarding specifics. CSW notified [name] of the situation and she said that she would follow up with [facility] regarding updated and proceed with next steps from there. CSW notified provider of placement update. Placement pending for pt at this time.    CSW has been in contact with Luke at [facility]. She stated that the DON is reviewing pt and CSW informed them that pt's restraint order have been discontinued since 07/19 at 0900. She said that she would reach back out to CSW in hopes of DC today.   CSW then followed up with pt's wife [name] and informed her of update and approval from [facility].   PASSR pending, sent documentation to [facility].    TOC will continue to follow.   Expected Discharge Plan:  (TBD) Barriers to Discharge: Continued Medical Work up  Expected Discharge Plan and Services In-house Referral: Clinical Social Work     Living arrangements for the past 2 months: Single Family Home                                       Social Determinants of Health (SDOH) Interventions SDOH Screenings   Food Insecurity: No Food Insecurity (10/12/2023)  Housing: Low Risk  (10/12/2023)  Transportation Needs: Unknown (10/12/2023)  Utilities: Not At Risk (10/12/2023)  Depression (PHQ2-9): Low Risk  (04/12/2021)  Financial Resource Strain: Low Risk  (07/28/2023)   Received from [facility]  Physical Activity: Inactive (04/12/2021)  Social Connections: Moderately Isolated  (10/12/2023)  Stress: No Stress Concern Present (04/12/2021)  Tobacco Use: Medium Risk (10/10/2023)    Readmission Risk Interventions     No data to display

## 2023-10-16 NOTE — Plan of Care (Signed)
  Problem: Education: Goal: Knowledge of General Education information will improve Description: Including pain rating scale, medication(s)/side effects and non-pharmacologic comfort measures Outcome: Not Progressing   Problem: Activity: Goal: Risk for activity intolerance will decrease Outcome: Not Progressing   Problem: Nutrition: Goal: Adequate nutrition will be maintained Outcome: Not Progressing   Problem: Pain Managment: Goal: General experience of comfort will improve and/or be controlled Outcome: Not Progressing
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
## 2023-10-16 NOTE — NC FL2 (Signed)
 [place]  MEDICAID [id number] LEVEL OF CARE FORM     IDENTIFICATION  Patient Name: Derek Cox. Birthdate: [birth year] Sex: male Admission Date (Current Location): 10/10/2023  [facility] and [place] Number:  Producer, television/film/video and Address:  [facility] [place], [address] [place], [place] [address]      Provider Number: [id number]  Attending Physician Name and Address:  Laurence Locus, DO  Relative Name and Phone Number:  Anel, Purohit (Spouse)  [phone]    Current Level of Care: Hospital Recommended Level of Care: Skilled Nursing Facility Prior Approval Number:    Date Approved/Denied:   PASRR Number: screen still running  Discharge Plan:      Current Diagnoses: Patient Active Problem List   Diagnosis Date Noted   DNR (do not resuscitate)/DNI(Do Not Intubate) 10/13/2023   Acute blood loss anemia (ABLA) 10/12/2023   AMS (altered mental status) 10/11/2023   Dementia associated with Parkinson disease (HCC) 07/25/2023   Parkinsonism (HCC) 07/28/2021   Trigeminal neuralgia of left side of face 02/09/2021   Spondylolisthesis of lumbar region 11/06/2019   Degeneration of lumbar intervertebral disc 06/22/2017   Low back pain 04/12/2017   Essential hypertension 12/02/2014   Hyperlipidemia 12/02/2014   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/2014   Insomnia 12/02/2014   Hx of seizure disorder 12/02/2014    Orientation RESPIRATION BLADDER Height & Weight     Self  Normal External catheter, Incontinent Weight: 160 lb 0.9 oz (72.6 kg) Height:  5' 10 (177.8 cm)  BEHAVIORAL SYMPTOMS/MOOD NEUROLOGICAL BOWEL NUTRITION STATUS      Continent Diet (see DC summary)  AMBULATORY STATUS COMMUNICATION OF NEEDS Skin   Extensive Assist Verbally Normal                       Personal Care Assistance Level of Assistance    Bathing Assistance: Maximum assistance   Dressing Assistance: Maximum assistance     Functional Limitations Info  Sight, Hearing, Speech  Sight Info: Impaired (wears glasses) Hearing Info: Impaired Speech Info: Impaired    SPECIAL CARE FACTORS FREQUENCY  PT (By licensed PT), OT (By licensed OT)     PT Frequency: 2-5x/week OT Frequency: 2-5x/week            Contractures Contractures Info: Not present    Additional Factors Info  Code Status, Allergies Code Status Info: DNR Allergies Info: Dilantin  (Phenytoin )           Current Medications (10/16/2023):  This is the current hospital active medication list Current Facility-Administered Medications  Medication Dose Route Frequency Provider Last Rate Last Admin   acetaminophen  (TYLENOL ) tablet 650 mg  650 mg Oral Q6H PRN Laurence Locus, DO   650 mg at 10/16/23 0024   Or   acetaminophen  (TYLENOL ) suppository 650 mg  650 mg Rectal Q6H PRN Laurence Locus, DO       cloNIDine  (CATAPRES  - Dosed in mg/24 hr) patch 0.2 mg  0.2 mg Transdermal Weekly Laurence Locus, DO   0.2 mg at 10/14/23 1634   donepezil  (ARICEPT ) tablet 5 mg  5 mg Oral QHS Georgina Basket, MD   5 mg at 10/16/23 0024   feeding supplement (ENSURE PLUS HIGH PROTEIN) liquid 237 mL  237 mL Oral BID BM Laurence Locus, DO   237 mL at 10/16/23 1125   haloperidol  lactate (HALDOL ) injection 5 mg  5 mg Intramuscular Q6H PRN Laurence Locus, DO       levETIRAcetam  (KEPPRA ) tablet 750 mg  750 mg Oral BID Georgina Basket, MD   750 mg at 10/16/23 1124   LORazepam  (ATIVAN ) tablet 1 mg  1 mg Oral Q4H PRN Sundil, Subrina, MD   1 mg at 10/16/23 0555   metoprolol  tartrate (LOPRESSOR ) tablet 37.5 mg  37.5 mg Oral BID Laurence Locus, DO   37.5 mg at 10/16/23 1124   ondansetron  (ZOFRAN ) tablet 4 mg  4 mg Oral Q6H PRN Laurence Locus, DO       QUEtiapine  (SEROQUEL ) tablet 50 mg  50 mg Oral BID Laurence Locus, DO   50 mg at 10/16/23 1123   rosuvastatin  (CRESTOR ) tablet 5 mg  5 mg Oral Daily Georgina Basket, MD   5 mg at 10/16/23 1124   tamsulosin  (FLOMAX ) capsule 0.4 mg  0.4 mg Oral Daily Georgina Basket, MD   0.4 mg at 10/16/23 1125   traZODone  (DESYREL ) tablet 50 mg   50 mg Oral QHS Moore, Willie, MD   50 mg at 10/16/23 0024     Discharge Medications: Please see discharge summary for a list of discharge medications.  Relevant Imaging Results:  Relevant Lab Results:   Additional Information SSN: [id number]  Takelia Urieta A [place], LCSW

## 2023-10-17 DIAGNOSIS — G20A1 Parkinson's disease without dyskinesia, without mention of fluctuations: Secondary | ICD-10-CM | POA: Diagnosis not present

## 2023-10-17 DIAGNOSIS — I10 Essential (primary) hypertension: Secondary | ICD-10-CM | POA: Diagnosis not present

## 2023-10-17 DIAGNOSIS — D62 Acute posthemorrhagic anemia: Secondary | ICD-10-CM | POA: Diagnosis not present

## 2023-10-17 DIAGNOSIS — R4182 Altered mental status, unspecified: Secondary | ICD-10-CM | POA: Diagnosis not present
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
## 2023-10-17 NOTE — Plan of Care (Signed)

## 2023-10-17 NOTE — TOC Progression Note (Signed)
 Transition of Care (TOC) - Progression Note    Patient Details  Name: Derek Cox. MRN: [id number] Date of Birth: 1945-08-02  Transition of Care [facility]) CM/SW Contact  Rosaline JONELLE Joe, RN Phone Number: 10/17/2023, 4:44 PM  Clinical Narrative:    CM called and spoke with Alfon[facility] with [facility] [phone] and she is a senior RNCM working with the family to coordinate placement for the patient - no available bed offers at this time.  [place], MSW is aware.   Expected Discharge Plan:  (TBD) Barriers to Discharge: Continued Medical Work up               Expected Discharge Plan and Services In-house Referral: Clinical Social Work     Living arrangements for the past 2 months: Single Family Home                                       Social Drivers of Health (SDOH) Interventions SDOH Screenings   Food Insecurity: No Food Insecurity (10/12/2023)  Housing: Low Risk  (10/12/2023)  Transportation Needs: Unknown (10/12/2023)  Utilities: Not At Risk (10/12/2023)  Depression (PHQ2-9): Low Risk  (04/12/2021)  Financial Resource Strain: Low Risk  (07/28/2023)   Received from [facility]  Physical Activity: Inactive (04/12/2021)  Social Connections: Moderately Isolated (10/12/2023)  Stress: No Stress Concern Present (04/12/2021)  Tobacco Use: Medium Risk (10/10/2023)    Readmission Risk Interventions     No data to display

## 2023-10-17 NOTE — Assessment & Plan Note (Signed)
 10-10-2023 staples placed by ER. 10-17-2023 can come out on 10-20-2023.

## 2023-10-17 NOTE — TOC Progression Note (Signed)
 Transition of Care (TOC) - Progression Note    Patient Details  Name: Derek Cox. MRN: [id number] Date of Birth: 08-16-1945  Transition of Care [facility]) CM/SW Contact  Srihith Aquilino A [place], LCSW Phone Number: 10/17/2023, 11:10 AM  Clinical Narrative:     CSW spoke with pt's wife Derek Cox at bedside. She said that her daughter Derek Cox spoke with Cindy the DON, who confirmed that pt is not able to discharge to [facility] at [place].   She said that she did reach out to [facility], spoke with Alfonso, who is reconsidering pt for placement. She requested CSW resend referral in the hub and would possibly visit pt at bedside today. She said that they would reach out to CSW tomorrow with decision regarding decision for placement.    TOC will continue to follow.   Expected Discharge Plan:  (TBD) Barriers to Discharge: Continued Medical Work up  Expected Discharge Plan and Services In-house Referral: Clinical Social Work     Living arrangements for the past 2 months: Single Family Home                                       Social Determinants of Health (SDOH) Interventions SDOH Screenings   Food Insecurity: No Food Insecurity (10/12/2023)  Housing: Low Risk  (10/12/2023)  Transportation Needs: Unknown (10/12/2023)  Utilities: Not At Risk (10/12/2023)  Depression (PHQ2-9): Low Risk  (04/12/2021)  Financial Resource Strain: Low Risk  (07/28/2023)   Received from [facility]  Physical Activity: Inactive (04/12/2021)  Social Connections: Moderately Isolated (10/12/2023)  Stress: No Stress Concern Present (04/12/2021)  Tobacco Use: Medium Risk (10/10/2023)    Readmission Risk Interventions     No data to display
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
## 2023-10-17 NOTE — Progress Notes (Signed)
   RN states that wife wants pt's staples on his head removed. It has been 7 days since staples were placed. Order written for RN to remove staples.  Camellia Door, DO [facility]

## 2023-10-18 DIAGNOSIS — R410 Disorientation, unspecified: Secondary | ICD-10-CM | POA: Diagnosis not present

## 2023-10-18 MED ORDER — HYDRALAZINE HCL 20 MG/ML IJ SOLN
20.0000 mg | INTRAMUSCULAR | Status: AC
  Filled 2023-10-18: qty 1

## 2023-10-18 MED ORDER — HYDRALAZINE HCL 20 MG/ML IJ SOLN
10.0000 mg | Freq: Four times a day (QID) | INTRAMUSCULAR | Status: DC | PRN
  Administered 2023-10-18 – 2023-10-23 (×2): 10 mg via INTRAMUSCULAR
  Filled 2023-10-18: qty 1

## 2023-10-18 MED ORDER — HYDRALAZINE HCL 20 MG/ML IJ SOLN: 10.0000 mg | Freq: Four times a day (QID) | INTRAMUSCULAR | Status: DC | PRN

## 2023-10-18 NOTE — Plan of Care (Signed)
 Hypertensive urgency Elevated systolic blood pressure 195 and diastolic 104.  Heart rate 65.  Patient is hemodynamically stable. Giving hydralazine  20 mg and continue as needed hydralazine .  Patient has been already on clonidine  Ultramol patch and Lopressor  twice daily.

## 2023-10-18 NOTE — TOC Progression Note (Addendum)
 Transition of Care (TOC) - Progression Note    Patient Details  Name: Derek Cox. MRN: [id number] Date of Birth: 1945-05-08  Transition of Care [facility]) CM/SW Contact  Secundino Ellithorpe A [place], LCSW Phone Number: 10/18/2023, 3:39 PM  Clinical Narrative:     Pt unable to go to [facility], facility feels care needs of pt are above what they can manage.   CSW met with pt's wife [name] at bedside and she requested for safe discharge plan for home versus finding another facility at this time.   CSW spoke with Mitrina, [phone], [facility], pt's care coordinator, who will be assisting coordinating pt's home discharge including scheduling 24/7 care.   CSW notified [facility] collective for possible Guide Program along with other home services to assist with pt's safe transition back home.    Case manger [place] notified of plan and assisting with obtaining DME equipment for home.   Estimated DC date for Monday.   MD notified of pending discharge plan.    TOC will continue to follow.   Expected Discharge Plan:  (TBD) Barriers to Discharge: Continued Medical Work up               Expected Discharge Plan and Services In-house Referral: Clinical Social Work     Living arrangements for the past 2 months: Single Family Home                                       Social Drivers of Health (SDOH) Interventions SDOH Screenings   Food Insecurity: No Food Insecurity (10/12/2023)  Housing: Low Risk  (10/12/2023)  Transportation Needs: Unknown (10/12/2023)  Utilities: Not At Risk (10/12/2023)  Depression (PHQ2-9): Low Risk  (04/12/2021)  Financial Resource Strain: Low Risk  (07/28/2023)   Received from [facility]  Physical Activity: Inactive (04/12/2021)  Social Connections: Moderately Isolated (10/12/2023)  Stress: No Stress Concern Present (04/12/2021)  Tobacco Use: Medium Risk (10/10/2023)    Readmission Risk Interventions     No data to display
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
## 2023-10-18 NOTE — TOC Progression Note (Addendum)
 Transition of Care (TOC) - Progression Note    Patient Details  Name: Derek Cox. MRN: [id number] Date of Birth: 1945-07-22  Transition of [facility]) CM/SW Contact  Keirstyn Aydt A [place], LCSW Phone Number: 10/18/2023, 10:27 AM  Clinical Narrative:     CSW spoke with Alfonso at [facility]. She said that Krystal is coming sometime this AM to meet with pt at bedside.   She stated that she would reach out to CSW and care coordinator with decision later today.   CSW then contacted pt's wife Derek Cox, who is at pt's bedside of site visit and pending decision.   TOC will continue to follow.    Expected Discharge Plan:  (TBD) Barriers to Discharge: Continued Medical Work up               Expected Discharge Plan and Services In-house Referral: Clinical Social Work     Living arrangements for the past 2 months: Single Family Home                                       Social Drivers of Health (SDOH) Interventions SDOH Screenings   Food Insecurity: No Food Insecurity (10/12/2023)  Housing: Low Risk  (10/12/2023)  Transportation Needs: Unknown (10/12/2023)  Utilities: Not At Risk (10/12/2023)  Depression (PHQ2-9): Low Risk  (04/12/2021)  Financial Resource Strain: Low Risk  (07/28/2023)   Received from [facility]  Physical Activity: Inactive (04/12/2021)  Social Connections: Moderately Isolated (10/12/2023)  Stress: No Stress Concern Present (04/12/2021)  Tobacco Use: Medium Risk (10/10/2023)    Readmission Risk Interventions     No data to display

## 2023-10-18 NOTE — Progress Notes (Signed)
    Durable Medical Equipment  (From admission, onward)           Start     Ordered   10/18/23 1551  For home use only DME standard manual wheelchair with seat cushion  Once       Comments: Patient suffers from dementia, physical deconditioning which impairs their ability to perform daily activities like dressing in the home.  A walker will not resolve issue with performing activities of daily living. A wheelchair will allow patient to safely perform daily activities. Patient can safely propel the wheelchair in the home or has a caregiver who can provide assistance. Length of need 12 months . Accessories: elevating leg rests (ELRs), wheel locks, extensions and anti-tippers.  Weight - 72 kg, Height - 5'10   10/18/23 1552   10/18/23 1550  For home use only DME Bedside commode  Once       Comments: Patient needs 3:1 provided in the bedroom since patient unable to ambulate to bathroom  Question Answer Comment  Patient needs a bedside commode to treat with the following condition Dementia [facility])   Patient needs a bedside commode to treat with the following condition Physical deconditioning      10/18/23 1552   10/18/23 1547  For home use only DME Hospital bed  Once       Question Answer Comment  Length of Need 6 Months   Patient has (list medical condition): Physical deconditioning, altered mental status, dementia, seizure disorder, anemia, total care for ADl's requiring hospital bed for nursing care support   The above medical condition requires: Patient requires the ability to reposition frequently   Head must be elevated greater than: 30 degrees   Bed type Semi-electric   Hoyer Lift Yes   Support Surface: Gel Overlay      10/18/23 1552
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
## 2023-10-18 NOTE — Progress Notes (Signed)
 Mobility Specialist: Progress Note   10/18/23 1515  Mobility  Activity Transferred from chair to bed  Level of Assistance Maximum assist, patient does 25-49%  Activity Response Tolerated well  Mobility Referral Yes  Mobility visit 1 Mobility  Mobility Specialist Start Time (ACUTE ONLY) 1350  Mobility Specialist Stop Time (ACUTE ONLY) 1410  Mobility Specialist Time Calculation (min) (ACUTE ONLY) 20 min    Pt received in chair, wife requesting assistance getting him back to bed. MaxA for transfer via face-to-face stand pivot to EOB. MaxA+2 for rolling with NT performed pericare d/t urinary and bowel incontinence. Left in bed with all needs met, call bell in reach.   Ileana Lute Mobility Specialist Please contact via SecureChat or Rehab office at [phone]

## 2023-10-18 NOTE — TOC Progression Note (Signed)
 Transition of Care (TOC) - Progression Note    Patient Details  Name: Derek Cox. MRN: [id number] Date of Birth: [birth year]  Transition of Care [facility]) CM/SW Contact  Rosaline JONELLE Joe, RN Phone Number: 10/18/2023, 4:11 PM  Clinical Narrative:    CM spoke with [place], MSW and patient's wife would like the patient to go home with home health and 24 hour care.  I called and spoke with Mitrina, RNCM with [facility] and she is making arrangement for patient for have 24 hour care at the home starting on Monday, 10/18/23.    Patient's wife/ RN Care manager was offered Medicare choice regarding DME and home health company and they did not have a preference.  MD was updated that I would set up to have [employer] deliver hospital bed, WC and 3:1 to the home on Friday, Saturday morning.  DME orders for hospital bed, wheelchair and 3:1 were placed to be co-signed by MD.  I called and spoke with Randine Nail, leadership with [facility] program and the patient has been accepted for services and plan to provide [facility] RN, PT, OT, MSW and aide along with MD that provide housecalls at the home.  HH orders placed to be co-signed by the MD.  I spoke with the wife and she is aware that [employer] will call her to plan delivery of the DME to the home Friday/Saturday morning.  Wife is aware that I will plan to arrange transportation to home MOnday morning via [facility].  IP Care Management will continue to follow the patient for care needs and transition to home on Monday, once 24 hour care is set up in the home.   Expected Discharge Plan:  (TBD) Barriers to Discharge: Continued Medical Work up               Expected Discharge Plan and Services In-house Referral: Clinical Social Work     Living arrangements for the past 2 months: Single Family Home                                       Social Drivers of Health (SDOH) Interventions SDOH Screenings   Food Insecurity: No Food  Insecurity (10/12/2023)  Housing: Low Risk  (10/12/2023)  Transportation Needs: Unknown (10/12/2023)  Utilities: Not At Risk (10/12/2023)  Depression (PHQ2-9): Low Risk  (04/12/2021)  Financial Resource Strain: Low Risk  (07/28/2023)   Received from [facility]  Physical Activity: Inactive (04/12/2021)  Social Connections: Moderately Isolated (10/12/2023)  Stress: No Stress Concern Present (04/12/2021)  Tobacco Use: Medium Risk (10/10/2023)    Readmission Risk Interventions     No data to display

## 2023-10-18 NOTE — Plan of Care (Signed)

## 2023-10-18 NOTE — Progress Notes (Signed)
 Mobility Specialist: Progress Note   10/18/23 1511  Mobility  Activity Transferred from bed to chair  Level of Assistance Maximum assist, patient does 25-49%  Activity Response Tolerated well  Mobility Referral Yes  Mobility visit 1 Mobility  Mobility Specialist Start Time (ACUTE ONLY) 1205  Mobility Specialist Stop Time (ACUTE ONLY) 1221  Mobility Specialist Time Calculation (min) (ACUTE ONLY) 16 min    Pt received in bed, agreeable to mobility session. Required mod verbal cues to open his eyes throughout session. MaxA to roll to sidelying position, minA to assist with trunk elevation for bed mobility. MaxA to transfer to chair via face-to-face stand pivot. Left in chair with all needs met, call bell in reach. Chair alarm on, wife present.   Ileana Lute Mobility Specialist Please contact via SecureChat or Rehab office at [phone]

## 2023-10-19 ENCOUNTER — Telehealth: Payer: Self-pay | Admitting: *Deleted

## 2023-10-19 DIAGNOSIS — R410 Disorientation, unspecified: Secondary | ICD-10-CM | POA: Diagnosis not present

## 2023-10-19 NOTE — Plan of Care (Signed)

## 2023-10-19 NOTE — TOC Progression Note (Signed)
 Transition of Care (TOC) - Progression Note    Patient Details  Name: Derek Cox. MRN: [id number] Date of Birth: [birth year]  Transition [facility]) CM/SW Contact  Rosaline JONELLE Joe, RN Phone Number: 10/19/2023, 3:35 PM  Clinical Narrative:    CM called and spoke with Irene Croak, RNCM with [facility] and she states that patient will be able to discharge home with Home Based care through [facility] that will be providing 24 hour care in the home.  Irene Croak, RNCM with [facility] is coordinating care with the patient's family and will keep the patient's wife updated regarding transitions of care for home - planned for Monday, 10/23/2023.   Expected Discharge Plan:  (TBD) Barriers to Discharge: Continued Medical Work up               Expected Discharge Plan and Services In-house Referral: Clinical Social Work     Living arrangements for the past 2 months: Single Family Home                                       Social Drivers of Health (SDOH) Interventions SDOH Screenings   Food Insecurity: No Food Insecurity (10/12/2023)  Housing: Low Risk  (10/12/2023)  Transportation Needs: Unknown (10/12/2023)  Utilities: Not At Risk (10/12/2023)  Depression (PHQ2-9): Low Risk  (04/12/2021)  Financial Resource Strain: Low Risk  (07/28/2023)   Received from [facility]  Physical Activity: Inactive (04/12/2021)  Social Connections: Moderately Isolated (10/12/2023)  Stress: No Stress Concern Present (04/12/2021)  Tobacco Use: Medium Risk (10/10/2023)    Readmission Risk Interventions     No data to display
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
## 2023-10-19 NOTE — Telephone Encounter (Signed)
 Copied from CRM [id number]. Topic: General - Other >> Oct 18, 2023  3:22 PM Lavanda D wrote: Reason for CRM: High priotity, Kacy with [facility] calling because she said she did not received any approval for her verbal orders of the PT and OT. She would like a callback to see if she can start.  Callback: [phone]
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
## 2023-10-19 NOTE — Progress Notes (Signed)
 [facility] Liaison RN note Received request from [facility] for GUIDE, home health and Home Based Primary Care after discharge.    [facility] information and contact numbers given to Mitrina Anice who is the Certified Health Care Advocate from [facility] for the family. Above information shared with [facility] [place] TOC.  Please call with any [facility] related questions or concerns.   Thank you for the opportunity to participate in this patient's care.  [name] Darien RN, BSN [facility] Liaison [phone]

## 2023-10-20 DIAGNOSIS — R4182 Altered mental status, unspecified: Secondary | ICD-10-CM | POA: Diagnosis not present

## 2023-10-20 NOTE — Plan of Care (Signed)
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
## 2023-10-20 NOTE — TOC Progression Note (Signed)
 Transition of Care (TOC) - Progression Note    Patient Details  Name: Derek Cox. MRN: [id number] Date of Birth: [birth year]  Transition of Care [facility]) CM/SW Contact  Rosaline JONELLE Joe, RN Phone Number: 10/20/2023, 2:59 PM  Clinical Narrative:    CM called and left a detailed message with the patient's family's private RNCM - Irene Croak, [facility] with [facility].    I called and spoke with [employer] today and the hospital bed, WC and 3:1 have been delivered to the home this morning.  Patient will have 24 hour care hired and available on MOnday, 10/20/2023.  Plan is for patient to have 24 hour care at the home through [facility] and [facility] will be provided through [facility] per Irene Croak, RNCM with family.  [facility] will be arranged for transportation for patient to be transported to the home on Monday morning for care since Memory Care facility placement was not an option earlier in the week.   Expected Discharge Plan:  (TBD) Barriers to Discharge: Continued Medical Work up               Expected Discharge Plan and Services In-house Referral: Clinical Social Work     Living arrangements for the past 2 months: Single Family Home                                       Social Drivers of Health (SDOH) Interventions SDOH Screenings   Food Insecurity: No Food Insecurity (10/12/2023)  Housing: Low Risk  (10/12/2023)  Transportation Needs: Unknown (10/12/2023)  Utilities: Not At Risk (10/12/2023)  Depression (PHQ2-9): Low Risk  (04/12/2021)  Financial Resource Strain: Low Risk  (07/28/2023)   Received from [facility]  Physical Activity: Inactive (04/12/2021)  Social Connections: Moderately Isolated (10/12/2023)  Stress: No Stress Concern Present (04/12/2021)  Tobacco Use: Medium Risk (10/10/2023)    Readmission Risk Interventions     No data to display

## 2023-10-21 NOTE — Progress Notes (Signed)
 No vitals taken at 4am. Pt asleep

## 2023-10-21 NOTE — Plan of Care (Signed)

## 2023-10-21 NOTE — Progress Notes (Signed)
 Mittens removed for 30 mins. Pt pulled off male purewick. Bed changed. New purewick placed. Mittens back on

## 2023-10-22 DIAGNOSIS — F02811 Dementia in other diseases classified elsewhere, unspecified severity, with agitation: Secondary | ICD-10-CM | POA: Diagnosis not present

## 2023-10-22 DIAGNOSIS — G3183 Dementia with Lewy bodies: Secondary | ICD-10-CM

## 2023-10-22 NOTE — Plan of Care (Signed)

## 2023-10-22 NOTE — Plan of Care (Signed)
  Problem: Education: Goal: Knowledge of General Education information will improve Description: Including pain rating scale, medication(s)/side effects and non-pharmacologic comfort measures 10/22/2023 1843 by Gerrie Merrianne HERO, RN Outcome: Progressing 10/22/2023 1843 by Gerrie Merrianne HERO, RN Outcome: Progressing 10/22/2023 1842 by Gerrie Merrianne HERO, RN Outcome: Progressing   Problem: Health Behavior/Discharge Planning: Goal: Ability to manage health-related needs will improve 10/22/2023 1843 by Gerrie Merrianne HERO, RN Outcome: Progressing 10/22/2023 1843 by Gerrie Merrianne HERO, RN Outcome: Progressing 10/22/2023 1842 by Gerrie Merrianne HERO, RN Outcome: Progressing   Problem: Clinical Measurements: Goal: Ability to maintain clinical measurements within normal limits will improve 10/22/2023 1843 by Gerrie Merrianne HERO, RN Outcome: Progressing 10/22/2023 1843 by Gerrie Merrianne HERO, RN Outcome: Progressing 10/22/2023 1842 by Gerrie Merrianne HERO, RN Outcome: Progressing Goal: Will remain free from infection 10/22/2023 1843 by Gerrie Merrianne HERO, RN Outcome: Progressing 10/22/2023 1843 by Gerrie Merrianne HERO, RN Outcome: Progressing 10/22/2023 1842 by Gerrie Merrianne HERO, RN Outcome: Progressing Goal: Diagnostic test results will improve 10/22/2023 1843 by Gerrie Merrianne HERO, RN Outcome: Progressing 10/22/2023 1843 by Gerrie Merrianne HERO, RN Outcome: Progressing 10/22/2023 1842 by Gerrie Merrianne HERO, RN Outcome: Progressing Goal: Respiratory complications will improve 10/22/2023 1843 by Gerrie Merrianne HERO, RN Outcome: Progressing 10/22/2023 1843 by Gerrie Merrianne HERO, RN Outcome: Progressing 10/22/2023 1842 by Gerrie Merrianne HERO, RN Outcome: Progressing Goal: Cardiovascular complication will be avoided 10/22/2023 1843 by Gerrie Merrianne HERO, RN Outcome: Progressing 10/22/2023 1843 by Gerrie Merrianne HERO, RN Outcome: Progressing 10/22/2023 1842 by Gerrie Merrianne HERO, RN Outcome: Progressing   Problem: Activity: Goal: Risk for activity intolerance will decrease 10/22/2023  1843 by Gerrie Merrianne HERO, RN Outcome: Progressing 10/22/2023 1843 by Gerrie Merrianne HERO, RN Outcome: Progressing 10/22/2023 1842 by Gerrie Merrianne HERO, RN Outcome: Progressing   Problem: Nutrition: Goal: Adequate nutrition will be maintained 10/22/2023 1843 by Gerrie Merrianne HERO, RN Outcome: Progressing 10/22/2023 1843 by Gerrie Merrianne HERO, RN Outcome: Progressing 10/22/2023 1842 by Gerrie Merrianne HERO, RN Outcome: Progressing   Problem: Coping: Goal: Level of anxiety will decrease 10/22/2023 1843 by Gerrie Merrianne HERO, RN Outcome: Progressing 10/22/2023 1843 by Gerrie Merrianne HERO, RN Outcome: Progressing 10/22/2023 1842 by Gerrie Merrianne HERO, RN Outcome: Progressing   Problem: Elimination: Goal: Will not experience complications related to bowel motility 10/22/2023 1843 by Gerrie Merrianne HERO, RN Outcome: Progressing 10/22/2023 1843 by Gerrie Merrianne HERO, RN Outcome: Progressing 10/22/2023 1842 by Gerrie Merrianne HERO, RN Outcome: Progressing Goal: Will not experience complications related to urinary retention 10/22/2023 1843 by Gerrie Merrianne HERO, RN Outcome: Progressing 10/22/2023 1843 by Gerrie Merrianne HERO, RN Outcome: Progressing 10/22/2023 1842 by Gerrie Merrianne HERO, RN Outcome: Progressing   Problem: Pain Managment: Goal: General experience of comfort will improve and/or be controlled 10/22/2023 1843 by Gerrie Merrianne HERO, RN Outcome: Progressing 10/22/2023 1843 by Gerrie Merrianne HERO, RN Outcome: Progressing 10/22/2023 1842 by Gerrie Merrianne HERO, RN Outcome: Progressing   Problem: Safety: Goal: Ability to remain free from injury will improve 10/22/2023 1843 by Gerrie Merrianne HERO, RN Outcome: Progressing 10/22/2023 1843 by Gerrie Merrianne HERO, RN Outcome: Progressing 10/22/2023 1842 by Gerrie Merrianne HERO, RN Outcome: Progressing   Problem: Skin Integrity: Goal: Risk for impaired skin integrity will decrease 10/22/2023 1843 by Gerrie Merrianne HERO, RN Outcome: Progressing 10/22/2023 1843 by Gerrie Merrianne HERO, RN Outcome: Progressing 10/22/2023 1842 by Gerrie Merrianne HERO, RN Outcome: Progressing
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
## 2023-10-22 NOTE — Plan of Care (Signed)
  Problem: Safety: Goal: Non-violent Restraint(s) 10/22/2023 1843 by Gerrie Merrianne HERO, RN Outcome: Progressing 10/22/2023 1842 by Gerrie Merrianne HERO, RN Outcome: Progressing   Problem: Education: Goal: Knowledge of General Education information will improve Description: Including pain rating scale, medication(s)/side effects and non-pharmacologic comfort measures 10/22/2023 1843 by Gerrie Merrianne HERO, RN Outcome: Progressing 10/22/2023 1842 by Gerrie Merrianne HERO, RN Outcome: Progressing   Problem: Health Behavior/Discharge Planning: Goal: Ability to manage health-related needs will improve 10/22/2023 1843 by Gerrie Merrianne HERO, RN Outcome: Progressing 10/22/2023 1842 by Gerrie Merrianne HERO, RN Outcome: Progressing   Problem: Clinical Measurements: Goal: Ability to maintain clinical measurements within normal limits will improve 10/22/2023 1843 by Gerrie Merrianne HERO, RN Outcome: Progressing 10/22/2023 1842 by Gerrie Merrianne HERO, RN Outcome: Progressing Goal: Will remain free from infection 10/22/2023 1843 by Gerrie Merrianne HERO, RN Outcome: Progressing 10/22/2023 1842 by Gerrie Merrianne HERO, RN Outcome: Progressing Goal: Diagnostic test results will improve 10/22/2023 1843 by Gerrie Merrianne HERO, RN Outcome: Progressing 10/22/2023 1842 by Gerrie Merrianne HERO, RN Outcome: Progressing Goal: Respiratory complications will improve 10/22/2023 1843 by Gerrie Merrianne HERO, RN Outcome: Progressing 10/22/2023 1842 by Gerrie Merrianne HERO, RN Outcome: Progressing Goal: Cardiovascular complication will be avoided 10/22/2023 1843 by Gerrie Merrianne HERO, RN Outcome: Progressing 10/22/2023 1842 by Gerrie Merrianne HERO, RN Outcome: Progressing   Problem: Activity: Goal: Risk for activity intolerance will decrease 10/22/2023 1843 by Gerrie Merrianne HERO, RN Outcome: Progressing 10/22/2023 1842 by Gerrie Merrianne HERO, RN Outcome: Progressing   Problem: Nutrition: Goal: Adequate nutrition will be maintained 10/22/2023 1843 by Gerrie Merrianne HERO, RN Outcome: Progressing 10/22/2023  1842 by Gerrie Merrianne HERO, RN Outcome: Progressing   Problem: Coping: Goal: Level of anxiety will decrease 10/22/2023 1843 by Gerrie Merrianne HERO, RN Outcome: Progressing 10/22/2023 1842 by Gerrie Merrianne HERO, RN Outcome: Progressing   Problem: Elimination: Goal: Will not experience complications related to bowel motility 10/22/2023 1843 by Gerrie Merrianne HERO, RN Outcome: Progressing 10/22/2023 1842 by Gerrie Merrianne HERO, RN Outcome: Progressing Goal: Will not experience complications related to urinary retention 10/22/2023 1843 by Gerrie Merrianne HERO, RN Outcome: Progressing 10/22/2023 1842 by Gerrie Merrianne HERO, RN Outcome: Progressing   Problem: Pain Managment: Goal: General experience of comfort will improve and/or be controlled 10/22/2023 1843 by Gerrie Merrianne HERO, RN Outcome: Progressing 10/22/2023 1842 by Gerrie Merrianne HERO, RN Outcome: Progressing   Problem: Safety: Goal: Ability to remain free from injury will improve 10/22/2023 1843 by Gerrie Merrianne HERO, RN Outcome: Progressing 10/22/2023 1842 by Gerrie Merrianne HERO, RN Outcome: Progressing   Problem: Skin Integrity: Goal: Risk for impaired skin integrity will decrease 10/22/2023 1843 by Gerrie Merrianne HERO, RN Outcome: Progressing 10/22/2023 1842 by Gerrie Merrianne HERO, RN Outcome: Progressing

## 2023-10-23 ENCOUNTER — Telehealth (HOSPITAL_COMMUNITY): Payer: Self-pay | Admitting: Pharmacy Technician

## 2023-10-23 ENCOUNTER — Other Ambulatory Visit (HOSPITAL_COMMUNITY): Payer: Self-pay

## 2023-10-23 DIAGNOSIS — F03918 Unspecified dementia, unspecified severity, with other behavioral disturbance: Secondary | ICD-10-CM | POA: Diagnosis not present

## 2023-10-23 MED ORDER — CLONIDINE 0.2 MG/24HR TD PTWK
0.2000 mg | MEDICATED_PATCH | TRANSDERMAL | 1 refills | Status: AC
  Filled 2023-10-23: qty 4, 28d supply, fill #0

## 2023-10-23 MED ORDER — ACETAMINOPHEN 325 MG PO TABS: 650.0000 mg | ORAL_TABLET | Freq: Four times a day (QID) | ORAL | Status: AC | PRN

## 2023-10-23 MED ORDER — LEVETIRACETAM 750 MG PO TABS: 750.0000 mg | ORAL_TABLET | Freq: Two times a day (BID) | ORAL | 1 refills | Status: DC

## 2023-10-23 MED ORDER — CLONIDINE 0.2 MG/24HR TD PTWK: 0.2000 mg | MEDICATED_PATCH | TRANSDERMAL | 1 refills | Status: DC

## 2023-10-23 MED ORDER — TAMSULOSIN HCL 0.4 MG PO CAPS
0.4000 mg | ORAL_CAPSULE | Freq: Every day | ORAL | 1 refills | Status: DC
  Filled 2023-10-23: qty 90, 90d supply, fill #0

## 2023-10-23 MED ORDER — METOPROLOL TARTRATE 25 MG PO TABS
37.5000 mg | ORAL_TABLET | Freq: Two times a day (BID) | ORAL | 1 refills | Status: AC
  Filled 2023-10-23: qty 90, 30d supply, fill #0

## 2023-10-23 MED ORDER — LORAZEPAM 0.5 MG PO TABS
ORAL_TABLET | ORAL | 0 refills | Status: DC
Start: 2023-10-23 — End: 2023-10-24
  Filled 2023-10-23: qty 20, 10d supply, fill #0

## 2023-10-23 MED ORDER — LORAZEPAM 0.5 MG PO TABS: 0.5000 mg | ORAL_TABLET | Freq: Two times a day (BID) | ORAL | 0 refills | Status: DC | PRN

## 2023-10-23 MED ORDER — ROSUVASTATIN CALCIUM 5 MG PO TABS: 5.0000 mg | ORAL_TABLET | Freq: Every day | ORAL | 1 refills | Status: DC

## 2023-10-23 MED ORDER — QUETIAPINE FUMARATE 25 MG PO TABS
25.0000 mg | ORAL_TABLET | Freq: Two times a day (BID) | ORAL | 1 refills | Status: DC
  Filled 2023-10-23: qty 60, 30d supply, fill #0

## 2023-10-23 MED ORDER — ROSUVASTATIN CALCIUM 5 MG PO TABS
5.0000 mg | ORAL_TABLET | Freq: Every day | ORAL | 1 refills | Status: AC
  Filled 2023-10-23: qty 90, 90d supply, fill #0

## 2023-10-23 MED ORDER — LEVETIRACETAM 750 MG PO TABS
750.0000 mg | ORAL_TABLET | Freq: Two times a day (BID) | ORAL | 1 refills | Status: AC
  Filled 2023-10-23: qty 180, 90d supply, fill #0

## 2023-10-23 MED ORDER — METOPROLOL TARTRATE 37.5 MG PO TABS: 37.5000 mg | ORAL_TABLET | Freq: Two times a day (BID) | ORAL | 1 refills | Status: DC

## 2023-10-23 NOTE — Telephone Encounter (Signed)
 Pharmacy Patient Advocate Encounter   Received notification from Inpatient Request that prior authorization for QUEtiapine  Fumarate 25MG  tablets is required/requested.   Insurance verification completed.   The patient is insured through [employer] .   Per test claim: PA required; PA submitted to above mentioned insurance via CoverMyMeds Key/confirmation #/EOC [facility] Status is pending
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
## 2023-10-23 NOTE — Plan of Care (Signed)
  Problem: Education: Goal: Knowledge of General Education information will improve Description: Including pain rating scale, medication(s)/side effects and non-pharmacologic comfort measures Outcome: Adequate for Discharge   Problem: Health Behavior/Discharge Planning: Goal: Ability to manage health-related needs will improve Outcome: Adequate for Discharge   Problem: Clinical Measurements: Goal: Ability to maintain clinical measurements within normal limits will improve Outcome: Adequate for Discharge Goal: Will remain free from infection Outcome: Adequate for Discharge Goal: Diagnostic test results will improve Outcome: Adequate for Discharge Goal: Respiratory complications will improve Outcome: Adequate for Discharge Goal: Cardiovascular complication will be avoided Outcome: Adequate for Discharge   Problem: Activity: Goal: Risk for activity intolerance will decrease Outcome: Adequate for Discharge   Problem: Nutrition: Goal: Adequate nutrition will be maintained Outcome: Adequate for Discharge   Problem: Coping: Goal: Level of anxiety will decrease Outcome: Adequate for Discharge   Problem: Elimination: Goal: Will not experience complications related to bowel motility Outcome: Adequate for Discharge Goal: Will not experience complications related to urinary retention Outcome: Adequate for Discharge   Problem: Pain Managment: Goal: General experience of comfort will improve and/or be controlled Outcome: Adequate for Discharge   Problem: Safety: Goal: Ability to remain free from injury will improve Outcome: Adequate for Discharge   Problem: Skin Integrity: Goal: Risk for impaired skin integrity will decrease Outcome: Adequate for Discharge   Problem: Acute Rehab PT Goals(only PT should resolve) Goal: Pt Will Go Supine/Side To Sit Outcome: Adequate for Discharge Goal: Pt Will Go Sit To Supine/Side Outcome: Adequate for Discharge Goal: Patient Will Transfer  Sit To/From Stand Outcome: Adequate for Discharge Goal: Pt Will Ambulate Outcome: Adequate for Discharge   Problem: Acute Rehab OT Goals (only OT should resolve) Goal: Pt. Will Perform Upper Body Dressing Outcome: Adequate for Discharge Goal: Pt. Will Perform Lower Body Dressing Outcome: Adequate for Discharge Goal: Pt. Will Transfer To Toilet Outcome: Adequate for Discharge

## 2023-10-23 NOTE — Telephone Encounter (Signed)
 Pharmacy Patient Advocate Encounter  Received notification from [employer] that Prior Authorization for QUEtiapine  Fumarate 25MG  tablets  has been APPROVED from 10/23/2023 to 03/27/2024   PA #/Case ID/Reference #: [id number]

## 2023-10-23 NOTE — Plan of Care (Signed)
   Referral received for Derek Cox. :goals of care discussion. Chart reviewed and noted discharge summary pending with plan for patient to return home today.  If appropriate, could consider outpatient home palliative medicine referral which TOC can assist with coordinating. Please reach out if acute PMT needs arise in the future.   Thank you for inviting us  to participate in the care of this patient.  Dalexa Gentz, PA-C Palliative Medicine Team  Team Phone # [phone]   NO CHARGE

## 2023-10-23 NOTE — Telephone Encounter (Signed)
 Please okay these orders  ?

## 2023-10-23 NOTE — Telephone Encounter (Signed)
 Spoke with [name] states that pt has already been set up for the requested orders with [facility]
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
## 2023-10-24 DIAGNOSIS — S0101XA Laceration without foreign body of scalp, initial encounter: Secondary | ICD-10-CM | POA: Diagnosis not present

## 2023-10-24 DIAGNOSIS — G20A1 Parkinson's disease without dyskinesia, without mention of fluctuations: Secondary | ICD-10-CM | POA: Diagnosis not present

## 2023-10-24 DIAGNOSIS — D62 Acute posthemorrhagic anemia: Secondary | ICD-10-CM | POA: Diagnosis not present

## 2023-10-24 DIAGNOSIS — R410 Disorientation, unspecified: Secondary | ICD-10-CM | POA: Diagnosis not present

## 2023-10-24 MED ORDER — QUETIAPINE FUMARATE 50 MG PO TABS: 50.0000 mg | ORAL_TABLET | Freq: Every day | ORAL | Status: DC

## 2023-10-24 MED ORDER — QUETIAPINE FUMARATE 50 MG PO TABS: 50.0000 mg | ORAL_TABLET | Freq: Every day | ORAL | 1 refills | Status: DC | PRN

## 2023-10-24 NOTE — Plan of Care (Signed)
  Problem: Nutrition Goal: Patient maintains adequate hydration Outcome: Progressing Goal: Patient maintains weight Outcome: Progressing Goal: Patient/Family demonstrates understanding of diet Outcome: Progressing Goal: Patient/Family independently completes tube feeding Outcome: Progressing Goal: Patient will have no more than 5 lb weight change during LOS Outcome: Progressing Goal: Patient will utilize adaptive techniques to administer nutrition Outcome: Progressing Goal: Patient will verbalize dietary restrictions Outcome: Progressing   

## 2023-10-24 NOTE — Progress Notes (Signed)
 PT Cancellation Note  Patient Details Name: Derek Cox. MRN: [id number] DOB: 1946-01-11   Cancelled Treatment:    Reason Eval/Treat Not Completed: Other (comment). Pt sleeping, RN asked PT to hold as trying to keep patient calm/not combative to be able to d/c home today. Acute PT to return as able, if needed.  Norene Ames, PT, DPT Acute Rehabilitation Services Secure chat preferred Office #: [phone]    Norene CHRISTELLA Ames 10/24/2023, 12:08 PM
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
## 2023-10-24 NOTE — TOC Transition Note (Addendum)
 Transition of Care [facility]) - Discharge Note   Patient Details  Name: Derek Cox. MRN: [id number] Date of Birth: May 28, 1945  Transition of Care [facility]) CM/SW Contact:  Rosaline JONELLE Joe, RN Phone Number: 10/24/2023, 11:46 AM   Clinical Narrative:    CM spoke with the attending MD, Dr. Arlon and plan is for patient to discharge home with family today.  Patient was seen by psychiatry this morning and they made additional medicine recommendations.  PTAR will be set up for transportation home.  Bedside nursing will provide TOC medications for home that were filled yesterday.  Discharge summary will be faxed to [facility] [email or web address].  10/24/2023 1521 - [facility] RNCM requested discharge summary be sent to her secure email.  Patient was discharged home earlier by [facility] with 24 hour care in place.     Barriers to Discharge: Continued Medical Work up   Patient Goals and CMS Choice Patient states their goals for this hospitalization and ongoing recovery are:: Unable to assess          Discharge Placement                       Discharge Plan and Services Additional resources added to the After Visit Summary for   In-house Referral: Clinical Social Work                                   Social Drivers of Health (SDOH) Interventions SDOH Screenings   Food Insecurity: No Food Insecurity (10/12/2023)  Housing: Low Risk  (10/12/2023)  Transportation Needs: Unknown (10/12/2023)  Utilities: Not At Risk (10/12/2023)  Depression (PHQ2-9): Low Risk  (04/12/2021)  Financial Resource Strain: Low Risk  (07/28/2023)   Received from [facility]  Physical Activity: Inactive (04/12/2021)  Social Connections: Moderately Isolated (10/12/2023)  Stress: No Stress Concern Present (04/12/2021)  Tobacco Use: Medium Risk (10/10/2023)     Readmission Risk Interventions    10/24/2023   11:46 AM  Readmission Risk Prevention Plan  Transportation Screening  Complete  PCP or Specialist Appt within 5-7 Days Complete  Home Care Screening Complete  Medication Review (RN CM) Complete
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
## 2023-10-25 DIAGNOSIS — Z981 Arthrodesis status: Secondary | ICD-10-CM | POA: Diagnosis not present

## 2023-10-25 DIAGNOSIS — G610 Guillain-Barre syndrome: Secondary | ICD-10-CM | POA: Diagnosis not present

## 2023-10-25 DIAGNOSIS — N138 Other obstructive and reflux uropathy: Secondary | ICD-10-CM | POA: Diagnosis not present

## 2023-10-25 DIAGNOSIS — I10 Essential (primary) hypertension: Secondary | ICD-10-CM | POA: Diagnosis not present

## 2023-10-25 DIAGNOSIS — W19XXXD Unspecified fall, subsequent encounter: Secondary | ICD-10-CM | POA: Diagnosis not present

## 2023-10-25 DIAGNOSIS — Z87891 Personal history of nicotine dependence: Secondary | ICD-10-CM | POA: Diagnosis not present

## 2023-10-25 DIAGNOSIS — G9341 Metabolic encephalopathy: Secondary | ICD-10-CM | POA: Diagnosis not present

## 2023-10-25 DIAGNOSIS — I69398 Other sequelae of cerebral infarction: Secondary | ICD-10-CM | POA: Diagnosis not present

## 2023-10-25 DIAGNOSIS — E785 Hyperlipidemia, unspecified: Secondary | ICD-10-CM | POA: Diagnosis not present

## 2023-10-25 DIAGNOSIS — S0101XD Laceration without foreign body of scalp, subsequent encounter: Secondary | ICD-10-CM | POA: Diagnosis not present

## 2023-10-25 DIAGNOSIS — G20A1 Parkinson's disease without dyskinesia, without mention of fluctuations: Secondary | ICD-10-CM | POA: Diagnosis not present

## 2023-10-25 DIAGNOSIS — Z85828 Personal history of other malignant neoplasm of skin: Secondary | ICD-10-CM | POA: Diagnosis not present

## 2023-10-25 DIAGNOSIS — F02818 Dementia in other diseases classified elsewhere, unspecified severity, with other behavioral disturbance: Secondary | ICD-10-CM | POA: Diagnosis not present

## 2023-10-25 DIAGNOSIS — G40909 Epilepsy, unspecified, not intractable, without status epilepticus: Secondary | ICD-10-CM | POA: Diagnosis not present

## 2023-10-25 DIAGNOSIS — D62 Acute posthemorrhagic anemia: Secondary | ICD-10-CM | POA: Diagnosis not present

## 2023-10-25 DIAGNOSIS — N401 Enlarged prostate with lower urinary tract symptoms: Secondary | ICD-10-CM | POA: Diagnosis not present

## 2023-10-25 DIAGNOSIS — Z7189 Other specified counseling: Secondary | ICD-10-CM | POA: Diagnosis not present

## 2023-10-25 DIAGNOSIS — Z9089 Acquired absence of other organs: Secondary | ICD-10-CM | POA: Diagnosis not present

## 2023-10-25 DIAGNOSIS — M19042 Primary osteoarthritis, left hand: Secondary | ICD-10-CM | POA: Diagnosis not present

## 2023-10-25 DIAGNOSIS — F02811 Dementia in other diseases classified elsewhere, unspecified severity, with agitation: Secondary | ICD-10-CM | POA: Diagnosis not present

## 2023-10-25 DIAGNOSIS — G309 Alzheimer's disease, unspecified: Secondary | ICD-10-CM | POA: Diagnosis not present

## 2023-10-25 DIAGNOSIS — K5900 Constipation, unspecified: Secondary | ICD-10-CM | POA: Diagnosis not present

## 2023-10-25 DIAGNOSIS — G4730 Sleep apnea, unspecified: Secondary | ICD-10-CM | POA: Diagnosis not present

## 2023-10-26 DIAGNOSIS — G4730 Sleep apnea, unspecified: Secondary | ICD-10-CM | POA: Diagnosis not present

## 2023-10-26 DIAGNOSIS — F02811 Dementia in other diseases classified elsewhere, unspecified severity, with agitation: Secondary | ICD-10-CM | POA: Diagnosis not present

## 2023-10-26 DIAGNOSIS — G20A1 Parkinson's disease without dyskinesia, without mention of fluctuations: Secondary | ICD-10-CM | POA: Diagnosis not present

## 2023-10-26 DIAGNOSIS — I10 Essential (primary) hypertension: Secondary | ICD-10-CM | POA: Diagnosis not present

## 2023-10-26 DIAGNOSIS — S0101XD Laceration without foreign body of scalp, subsequent encounter: Secondary | ICD-10-CM | POA: Diagnosis not present

## 2023-10-26 DIAGNOSIS — F02818 Dementia in other diseases classified elsewhere, unspecified severity, with other behavioral disturbance: Secondary | ICD-10-CM | POA: Diagnosis not present

## 2023-10-30 DIAGNOSIS — F02818 Dementia in other diseases classified elsewhere, unspecified severity, with other behavioral disturbance: Secondary | ICD-10-CM | POA: Diagnosis not present

## 2023-10-30 DIAGNOSIS — S0101XD Laceration without foreign body of scalp, subsequent encounter: Secondary | ICD-10-CM | POA: Diagnosis not present

## 2023-10-30 DIAGNOSIS — G20A1 Parkinson's disease without dyskinesia, without mention of fluctuations: Secondary | ICD-10-CM | POA: Diagnosis not present

## 2023-10-30 DIAGNOSIS — F02811 Dementia in other diseases classified elsewhere, unspecified severity, with agitation: Secondary | ICD-10-CM | POA: Diagnosis not present

## 2023-10-30 DIAGNOSIS — I10 Essential (primary) hypertension: Secondary | ICD-10-CM | POA: Diagnosis not present

## 2023-10-30 DIAGNOSIS — G4730 Sleep apnea, unspecified: Secondary | ICD-10-CM | POA: Diagnosis not present

## 2023-11-01 DIAGNOSIS — G4730 Sleep apnea, unspecified: Secondary | ICD-10-CM | POA: Diagnosis not present

## 2023-11-01 DIAGNOSIS — F02811 Dementia in other diseases classified elsewhere, unspecified severity, with agitation: Secondary | ICD-10-CM | POA: Diagnosis not present

## 2023-11-01 DIAGNOSIS — F02818 Dementia in other diseases classified elsewhere, unspecified severity, with other behavioral disturbance: Secondary | ICD-10-CM | POA: Diagnosis not present

## 2023-11-01 DIAGNOSIS — G20A1 Parkinson's disease without dyskinesia, without mention of fluctuations: Secondary | ICD-10-CM | POA: Diagnosis not present

## 2023-11-01 DIAGNOSIS — S0101XD Laceration without foreign body of scalp, subsequent encounter: Secondary | ICD-10-CM | POA: Diagnosis not present

## 2023-11-01 DIAGNOSIS — I10 Essential (primary) hypertension: Secondary | ICD-10-CM | POA: Diagnosis not present

## 2023-11-02 DIAGNOSIS — G4730 Sleep apnea, unspecified: Secondary | ICD-10-CM | POA: Diagnosis not present

## 2023-11-02 DIAGNOSIS — I10 Essential (primary) hypertension: Secondary | ICD-10-CM | POA: Diagnosis not present

## 2023-11-02 DIAGNOSIS — F02811 Dementia in other diseases classified elsewhere, unspecified severity, with agitation: Secondary | ICD-10-CM | POA: Diagnosis not present

## 2023-11-02 DIAGNOSIS — S0101XD Laceration without foreign body of scalp, subsequent encounter: Secondary | ICD-10-CM | POA: Diagnosis not present

## 2023-11-02 DIAGNOSIS — F02818 Dementia in other diseases classified elsewhere, unspecified severity, with other behavioral disturbance: Secondary | ICD-10-CM | POA: Diagnosis not present

## 2023-11-02 DIAGNOSIS — G20A1 Parkinson's disease without dyskinesia, without mention of fluctuations: Secondary | ICD-10-CM | POA: Diagnosis not present

## 2023-11-03 DIAGNOSIS — I10 Essential (primary) hypertension: Secondary | ICD-10-CM | POA: Diagnosis not present

## 2023-11-03 DIAGNOSIS — F02818 Dementia in other diseases classified elsewhere, unspecified severity, with other behavioral disturbance: Secondary | ICD-10-CM | POA: Diagnosis not present

## 2023-11-03 DIAGNOSIS — G20A1 Parkinson's disease without dyskinesia, without mention of fluctuations: Secondary | ICD-10-CM | POA: Diagnosis not present

## 2023-11-03 DIAGNOSIS — S0101XD Laceration without foreign body of scalp, subsequent encounter: Secondary | ICD-10-CM | POA: Diagnosis not present

## 2023-11-03 DIAGNOSIS — F02811 Dementia in other diseases classified elsewhere, unspecified severity, with agitation: Secondary | ICD-10-CM | POA: Diagnosis not present

## 2023-11-03 DIAGNOSIS — G4730 Sleep apnea, unspecified: Secondary | ICD-10-CM | POA: Diagnosis not present

## 2023-11-06 ENCOUNTER — Other Ambulatory Visit (HOSPITAL_COMMUNITY): Payer: Self-pay

## 2023-11-08 ENCOUNTER — Other Ambulatory Visit: Payer: Self-pay | Admitting: Family Medicine

## 2023-11-08 DIAGNOSIS — I10 Essential (primary) hypertension: Secondary | ICD-10-CM | POA: Diagnosis not present

## 2023-11-08 DIAGNOSIS — G4730 Sleep apnea, unspecified: Secondary | ICD-10-CM | POA: Diagnosis not present

## 2023-11-08 DIAGNOSIS — G20A1 Parkinson's disease without dyskinesia, without mention of fluctuations: Secondary | ICD-10-CM | POA: Diagnosis not present

## 2023-11-08 DIAGNOSIS — N138 Other obstructive and reflux uropathy: Secondary | ICD-10-CM

## 2023-11-08 DIAGNOSIS — F02811 Dementia in other diseases classified elsewhere, unspecified severity, with agitation: Secondary | ICD-10-CM | POA: Diagnosis not present

## 2023-11-08 DIAGNOSIS — F02818 Dementia in other diseases classified elsewhere, unspecified severity, with other behavioral disturbance: Secondary | ICD-10-CM | POA: Diagnosis not present

## 2023-11-08 DIAGNOSIS — S0101XD Laceration without foreign body of scalp, subsequent encounter: Secondary | ICD-10-CM | POA: Diagnosis not present

## 2023-11-09 ENCOUNTER — Telehealth: Payer: Self-pay | Admitting: *Deleted

## 2023-11-09 DIAGNOSIS — G20A1 Parkinson's disease without dyskinesia, without mention of fluctuations: Secondary | ICD-10-CM | POA: Diagnosis not present

## 2023-11-09 DIAGNOSIS — S0101XD Laceration without foreign body of scalp, subsequent encounter: Secondary | ICD-10-CM | POA: Diagnosis not present

## 2023-11-09 DIAGNOSIS — F02811 Dementia in other diseases classified elsewhere, unspecified severity, with agitation: Secondary | ICD-10-CM | POA: Diagnosis not present

## 2023-11-09 DIAGNOSIS — I10 Essential (primary) hypertension: Secondary | ICD-10-CM | POA: Diagnosis not present

## 2023-11-09 DIAGNOSIS — F02818 Dementia in other diseases classified elsewhere, unspecified severity, with other behavioral disturbance: Secondary | ICD-10-CM | POA: Diagnosis not present

## 2023-11-09 DIAGNOSIS — G4730 Sleep apnea, unspecified: Secondary | ICD-10-CM | POA: Diagnosis not present

## 2023-11-09 NOTE — Telephone Encounter (Signed)
 Copied from CRM [id number]. Topic: General - Other >> Nov 09, 2023 12:38 PM Rosina BIRCH wrote: Reason for RMF:[id number] wife called stating the patient has in home sitters coming in plus occupational therapy, physical therapy, speech therapy and nurse. The patient wife states the more people come the more the patient gets agitated. The patient is taking QUEtiapine  25 mg. The patient wife give him two in the morning, two at night and if needed during the day she gives him some more every couple of hours. It never calms the patient down and the medication does not phase him/not working. The patient is cussing and taking his hands and beating them on the table and yelling. The physical therapy told the patient wife to take the patient to the emergency room and they have psych people there to determine what medicine to give him. The patient can not even walk and he is agitated. The patient can not form his words anymore and has trouble siting up. The patient wife want to know if there is any other medication to prescribe CB [phone]

## 2023-11-10 DIAGNOSIS — F0283 Dementia in other diseases classified elsewhere, unspecified severity, with mood disturbance: Secondary | ICD-10-CM | POA: Diagnosis not present

## 2023-11-10 DIAGNOSIS — G20A1 Parkinson's disease without dyskinesia, without mention of fluctuations: Secondary | ICD-10-CM | POA: Diagnosis not present

## 2023-11-10 DIAGNOSIS — F0284 Dementia in other diseases classified elsewhere, unspecified severity, with anxiety: Secondary | ICD-10-CM | POA: Diagnosis not present

## 2023-11-10 DIAGNOSIS — G309 Alzheimer's disease, unspecified: Secondary | ICD-10-CM | POA: Diagnosis not present

## 2023-11-10 MED ORDER — RISPERIDONE 2 MG PO TABS: 2.0000 mg | ORAL_TABLET | Freq: Two times a day (BID) | ORAL | 0 refills | Status: AC

## 2023-11-10 NOTE — Telephone Encounter (Signed)
 Stop the Quetiapine, and instead I sent in Risperidone 2 mg to take twice a day. Report back in 2 weeks.

## 2023-11-10 NOTE — Telephone Encounter (Signed)
Left message for patient to return my call,

## 2023-11-13 DIAGNOSIS — G4730 Sleep apnea, unspecified: Secondary | ICD-10-CM | POA: Diagnosis not present

## 2023-11-13 DIAGNOSIS — G20A1 Parkinson's disease without dyskinesia, without mention of fluctuations: Secondary | ICD-10-CM | POA: Diagnosis not present

## 2023-11-13 DIAGNOSIS — S0101XD Laceration without foreign body of scalp, subsequent encounter: Secondary | ICD-10-CM | POA: Diagnosis not present

## 2023-11-13 DIAGNOSIS — I10 Essential (primary) hypertension: Secondary | ICD-10-CM | POA: Diagnosis not present

## 2023-11-13 DIAGNOSIS — F02811 Dementia in other diseases classified elsewhere, unspecified severity, with agitation: Secondary | ICD-10-CM | POA: Diagnosis not present

## 2023-11-13 DIAGNOSIS — F02818 Dementia in other diseases classified elsewhere, unspecified severity, with other behavioral disturbance: Secondary | ICD-10-CM | POA: Diagnosis not present

## 2023-11-13 NOTE — Telephone Encounter (Signed)
 Left detailed message on voicemail informing patient of the message below and to call back with any questions.

## 2023-11-16 DIAGNOSIS — G40909 Epilepsy, unspecified, not intractable, without status epilepticus: Secondary | ICD-10-CM | POA: Diagnosis not present

## 2023-11-16 DIAGNOSIS — N401 Enlarged prostate with lower urinary tract symptoms: Secondary | ICD-10-CM | POA: Diagnosis not present

## 2023-11-16 DIAGNOSIS — G3183 Dementia with Lewy bodies: Secondary | ICD-10-CM | POA: Diagnosis not present

## 2023-11-16 DIAGNOSIS — I10 Essential (primary) hypertension: Secondary | ICD-10-CM | POA: Diagnosis not present

## 2023-11-16 DIAGNOSIS — D638 Anemia in other chronic diseases classified elsewhere: Secondary | ICD-10-CM | POA: Diagnosis not present

## 2023-11-16 DIAGNOSIS — G4730 Sleep apnea, unspecified: Secondary | ICD-10-CM | POA: Diagnosis not present

## 2023-11-16 DIAGNOSIS — I69398 Other sequelae of cerebral infarction: Secondary | ICD-10-CM | POA: Diagnosis not present

## 2023-11-16 DIAGNOSIS — Z9181 History of falling: Secondary | ICD-10-CM | POA: Diagnosis not present

## 2023-11-16 DIAGNOSIS — S0101XA Laceration without foreign body of scalp, initial encounter: Secondary | ICD-10-CM | POA: Diagnosis not present

## 2023-11-16 DIAGNOSIS — G20A1 Parkinson's disease without dyskinesia, without mention of fluctuations: Secondary | ICD-10-CM | POA: Diagnosis not present

## 2023-11-16 DIAGNOSIS — E785 Hyperlipidemia, unspecified: Secondary | ICD-10-CM | POA: Diagnosis not present

## 2023-11-17 DIAGNOSIS — G3183 Dementia with Lewy bodies: Secondary | ICD-10-CM | POA: Diagnosis not present

## 2023-11-17 DIAGNOSIS — S0101XA Laceration without foreign body of scalp, initial encounter: Secondary | ICD-10-CM | POA: Diagnosis not present

## 2023-11-17 DIAGNOSIS — G20A1 Parkinson's disease without dyskinesia, without mention of fluctuations: Secondary | ICD-10-CM | POA: Diagnosis not present

## 2023-11-17 DIAGNOSIS — I10 Essential (primary) hypertension: Secondary | ICD-10-CM | POA: Diagnosis not present

## 2023-11-17 DIAGNOSIS — Z9181 History of falling: Secondary | ICD-10-CM | POA: Diagnosis not present

## 2023-11-17 DIAGNOSIS — I69398 Other sequelae of cerebral infarction: Secondary | ICD-10-CM | POA: Diagnosis not present

## 2023-11-20 DIAGNOSIS — G20A1 Parkinson's disease without dyskinesia, without mention of fluctuations: Secondary | ICD-10-CM | POA: Diagnosis not present

## 2023-11-20 DIAGNOSIS — G3183 Dementia with Lewy bodies: Secondary | ICD-10-CM | POA: Diagnosis not present

## 2023-11-20 DIAGNOSIS — S0101XA Laceration without foreign body of scalp, initial encounter: Secondary | ICD-10-CM | POA: Diagnosis not present

## 2023-11-20 DIAGNOSIS — I69398 Other sequelae of cerebral infarction: Secondary | ICD-10-CM | POA: Diagnosis not present

## 2023-11-20 DIAGNOSIS — Z9181 History of falling: Secondary | ICD-10-CM | POA: Diagnosis not present

## 2023-11-20 DIAGNOSIS — I10 Essential (primary) hypertension: Secondary | ICD-10-CM | POA: Diagnosis not present

## 2023-11-21 ENCOUNTER — Other Ambulatory Visit (HOSPITAL_COMMUNITY): Payer: Self-pay

## 2023-11-21 DIAGNOSIS — S0101XA Laceration without foreign body of scalp, initial encounter: Secondary | ICD-10-CM | POA: Diagnosis not present

## 2023-11-21 DIAGNOSIS — I69398 Other sequelae of cerebral infarction: Secondary | ICD-10-CM | POA: Diagnosis not present

## 2023-11-21 DIAGNOSIS — G20A1 Parkinson's disease without dyskinesia, without mention of fluctuations: Secondary | ICD-10-CM | POA: Diagnosis not present

## 2023-11-21 DIAGNOSIS — G3183 Dementia with Lewy bodies: Secondary | ICD-10-CM | POA: Diagnosis not present

## 2023-11-21 DIAGNOSIS — Z9181 History of falling: Secondary | ICD-10-CM | POA: Diagnosis not present

## 2023-11-21 DIAGNOSIS — I10 Essential (primary) hypertension: Secondary | ICD-10-CM | POA: Diagnosis not present

## 2023-11-23 ENCOUNTER — Other Ambulatory Visit (HOSPITAL_COMMUNITY): Payer: Self-pay

## 2023-11-24 DIAGNOSIS — G3183 Dementia with Lewy bodies: Secondary | ICD-10-CM | POA: Diagnosis not present

## 2023-11-24 DIAGNOSIS — G20A1 Parkinson's disease without dyskinesia, without mention of fluctuations: Secondary | ICD-10-CM | POA: Diagnosis not present

## 2023-11-24 DIAGNOSIS — I69398 Other sequelae of cerebral infarction: Secondary | ICD-10-CM | POA: Diagnosis not present

## 2023-11-24 DIAGNOSIS — S0101XA Laceration without foreign body of scalp, initial encounter: Secondary | ICD-10-CM | POA: Diagnosis not present

## 2023-11-24 DIAGNOSIS — Z9181 History of falling: Secondary | ICD-10-CM | POA: Diagnosis not present

## 2023-11-24 DIAGNOSIS — I10 Essential (primary) hypertension: Secondary | ICD-10-CM | POA: Diagnosis not present

## 2023-11-27 DIAGNOSIS — N401 Enlarged prostate with lower urinary tract symptoms: Secondary | ICD-10-CM | POA: Diagnosis not present

## 2023-11-27 DIAGNOSIS — G4730 Sleep apnea, unspecified: Secondary | ICD-10-CM | POA: Diagnosis not present

## 2023-11-27 DIAGNOSIS — G20A1 Parkinson's disease without dyskinesia, without mention of fluctuations: Secondary | ICD-10-CM | POA: Diagnosis not present

## 2023-11-27 DIAGNOSIS — D638 Anemia in other chronic diseases classified elsewhere: Secondary | ICD-10-CM | POA: Diagnosis not present

## 2023-11-27 DIAGNOSIS — G3183 Dementia with Lewy bodies: Secondary | ICD-10-CM | POA: Diagnosis not present

## 2023-11-27 DIAGNOSIS — I10 Essential (primary) hypertension: Secondary | ICD-10-CM | POA: Diagnosis not present

## 2023-11-27 DIAGNOSIS — I69398 Other sequelae of cerebral infarction: Secondary | ICD-10-CM | POA: Diagnosis not present

## 2023-11-27 DIAGNOSIS — E785 Hyperlipidemia, unspecified: Secondary | ICD-10-CM | POA: Diagnosis not present

## 2023-11-27 DIAGNOSIS — G40909 Epilepsy, unspecified, not intractable, without status epilepticus: Secondary | ICD-10-CM | POA: Diagnosis not present

## 2023-11-27 DIAGNOSIS — S0101XA Laceration without foreign body of scalp, initial encounter: Secondary | ICD-10-CM | POA: Diagnosis not present

## 2023-11-27 DIAGNOSIS — Z9181 History of falling: Secondary | ICD-10-CM | POA: Diagnosis not present

## 2023-12-01 DIAGNOSIS — S0101XA Laceration without foreign body of scalp, initial encounter: Secondary | ICD-10-CM | POA: Diagnosis not present

## 2023-12-01 DIAGNOSIS — G20A1 Parkinson's disease without dyskinesia, without mention of fluctuations: Secondary | ICD-10-CM | POA: Diagnosis not present

## 2023-12-01 DIAGNOSIS — I10 Essential (primary) hypertension: Secondary | ICD-10-CM | POA: Diagnosis not present

## 2023-12-01 DIAGNOSIS — G3183 Dementia with Lewy bodies: Secondary | ICD-10-CM | POA: Diagnosis not present

## 2023-12-01 DIAGNOSIS — Z9181 History of falling: Secondary | ICD-10-CM | POA: Diagnosis not present

## 2023-12-01 DIAGNOSIS — I69398 Other sequelae of cerebral infarction: Secondary | ICD-10-CM | POA: Diagnosis not present

## 2023-12-08 DIAGNOSIS — I10 Essential (primary) hypertension: Secondary | ICD-10-CM | POA: Diagnosis not present

## 2023-12-08 DIAGNOSIS — Z9181 History of falling: Secondary | ICD-10-CM | POA: Diagnosis not present

## 2023-12-08 DIAGNOSIS — G3183 Dementia with Lewy bodies: Secondary | ICD-10-CM | POA: Diagnosis not present

## 2023-12-08 DIAGNOSIS — S0101XA Laceration without foreign body of scalp, initial encounter: Secondary | ICD-10-CM | POA: Diagnosis not present

## 2023-12-08 DIAGNOSIS — I69398 Other sequelae of cerebral infarction: Secondary | ICD-10-CM | POA: Diagnosis not present

## 2023-12-08 DIAGNOSIS — G20A1 Parkinson's disease without dyskinesia, without mention of fluctuations: Secondary | ICD-10-CM | POA: Diagnosis not present

## 2023-12-14 DIAGNOSIS — S0101XA Laceration without foreign body of scalp, initial encounter: Secondary | ICD-10-CM | POA: Diagnosis not present

## 2023-12-14 DIAGNOSIS — G20A1 Parkinson's disease without dyskinesia, without mention of fluctuations: Secondary | ICD-10-CM | POA: Diagnosis not present

## 2023-12-14 DIAGNOSIS — I69398 Other sequelae of cerebral infarction: Secondary | ICD-10-CM | POA: Diagnosis not present

## 2023-12-14 DIAGNOSIS — I10 Essential (primary) hypertension: Secondary | ICD-10-CM | POA: Diagnosis not present

## 2023-12-14 DIAGNOSIS — G3183 Dementia with Lewy bodies: Secondary | ICD-10-CM | POA: Diagnosis not present

## 2023-12-14 DIAGNOSIS — Z9181 History of falling: Secondary | ICD-10-CM | POA: Diagnosis not present

## 2023-12-15 DIAGNOSIS — I69398 Other sequelae of cerebral infarction: Secondary | ICD-10-CM | POA: Diagnosis not present

## 2023-12-15 DIAGNOSIS — Z9181 History of falling: Secondary | ICD-10-CM | POA: Diagnosis not present

## 2023-12-15 DIAGNOSIS — G20A1 Parkinson's disease without dyskinesia, without mention of fluctuations: Secondary | ICD-10-CM | POA: Diagnosis not present

## 2023-12-15 DIAGNOSIS — G3183 Dementia with Lewy bodies: Secondary | ICD-10-CM | POA: Diagnosis not present

## 2023-12-15 DIAGNOSIS — I10 Essential (primary) hypertension: Secondary | ICD-10-CM | POA: Diagnosis not present

## 2023-12-15 DIAGNOSIS — S0101XA Laceration without foreign body of scalp, initial encounter: Secondary | ICD-10-CM | POA: Diagnosis not present

## 2023-12-18 DIAGNOSIS — G20A1 Parkinson's disease without dyskinesia, without mention of fluctuations: Secondary | ICD-10-CM | POA: Diagnosis not present

## 2023-12-18 DIAGNOSIS — G3183 Dementia with Lewy bodies: Secondary | ICD-10-CM | POA: Diagnosis not present

## 2023-12-18 DIAGNOSIS — S0101XA Laceration without foreign body of scalp, initial encounter: Secondary | ICD-10-CM | POA: Diagnosis not present

## 2023-12-18 DIAGNOSIS — I10 Essential (primary) hypertension: Secondary | ICD-10-CM | POA: Diagnosis not present

## 2023-12-18 DIAGNOSIS — I69398 Other sequelae of cerebral infarction: Secondary | ICD-10-CM | POA: Diagnosis not present

## 2023-12-18 DIAGNOSIS — Z9181 History of falling: Secondary | ICD-10-CM | POA: Diagnosis not present

## 2023-12-19 ENCOUNTER — Emergency Department (HOSPITAL_BASED_OUTPATIENT_CLINIC_OR_DEPARTMENT_OTHER)

## 2023-12-19 ENCOUNTER — Other Ambulatory Visit: Payer: Self-pay

## 2023-12-19 ENCOUNTER — Emergency Department (HOSPITAL_BASED_OUTPATIENT_CLINIC_OR_DEPARTMENT_OTHER)
Admission: EM | Admit: 2023-12-19 | Discharge: 2023-12-19 | Disposition: A | Discharge status: Home / Self Care | Attending: Emergency Medicine | Admitting: Emergency Medicine

## 2023-12-19 DIAGNOSIS — Z043 Encounter for examination and observation following other accident: Secondary | ICD-10-CM | POA: Diagnosis not present

## 2023-12-19 DIAGNOSIS — W07XXXA Fall from chair, initial encounter: Secondary | ICD-10-CM | POA: Insufficient documentation

## 2023-12-19 DIAGNOSIS — G20C Parkinsonism, unspecified: Secondary | ICD-10-CM | POA: Diagnosis not present

## 2023-12-19 DIAGNOSIS — S61411A Laceration without foreign body of right hand, initial encounter: Secondary | ICD-10-CM | POA: Insufficient documentation

## 2023-12-19 DIAGNOSIS — F0390 Unspecified dementia without behavioral disturbance: Secondary | ICD-10-CM | POA: Insufficient documentation

## 2023-12-19 DIAGNOSIS — I10 Essential (primary) hypertension: Secondary | ICD-10-CM | POA: Diagnosis not present

## 2023-12-19 DIAGNOSIS — I6523 Occlusion and stenosis of bilateral carotid arteries: Secondary | ICD-10-CM | POA: Diagnosis not present

## 2023-12-19 DIAGNOSIS — Z79899 Other long term (current) drug therapy: Secondary | ICD-10-CM | POA: Insufficient documentation

## 2023-12-19 DIAGNOSIS — G9389 Other specified disorders of brain: Secondary | ICD-10-CM | POA: Diagnosis not present

## 2023-12-19 DIAGNOSIS — M47812 Spondylosis without myelopathy or radiculopathy, cervical region: Secondary | ICD-10-CM | POA: Diagnosis not present

## 2023-12-19 DIAGNOSIS — S6991XA Unspecified injury of right wrist, hand and finger(s), initial encounter: Secondary | ICD-10-CM | POA: Diagnosis present

## 2023-12-19 DIAGNOSIS — M5030 Other cervical disc degeneration, unspecified cervical region: Secondary | ICD-10-CM | POA: Diagnosis not present

## 2023-12-19 DIAGNOSIS — R58 Hemorrhage, not elsewhere classified: Secondary | ICD-10-CM | POA: Diagnosis not present

## 2023-12-19 DIAGNOSIS — Z981 Arthrodesis status: Secondary | ICD-10-CM | POA: Diagnosis not present

## 2023-12-19 MED ORDER — LIDOCAINE-EPINEPHRINE-TETRACAINE (LET) TOPICAL GEL
3.0000 mL | Freq: Once | TOPICAL | Status: AC
  Administered 2023-12-19: 3 mL via TOPICAL
  Filled 2023-12-19: qty 3
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
## 2023-12-19 NOTE — ED Notes (Addendum)
 Reviewed AVS/discharge instructions with patient and spouse. Time allotted for and all questions answered. Patient is agreeable for d/c and escorted in wheelchair to ED exit by staff and leaving with wife in her vehicle. Pt refused d/c vitals.

## 2023-12-19 NOTE — Discharge Instructions (Signed)
 Please follow-up with primary care in the next several weeks for evaluation of his wound healing.

## 2023-12-19 NOTE — ED Provider Notes (Signed)
 [place] [facility]GENCY DEPARTMENT AT [facility] Provider Note   CSN: [id number] Arrival date & time: 12/19/23  1315     Patient presents with: Skin Tear   Derek Cox. is a 78 y.o. male who comes to the ED today with a skin tear on the right dorsal hand.  He was getting up from a chair at his home when he slipped off the chair falling onto the dorsal aspect of the right hand and forearm.  Care aide was with him at the time and helped him to his feet, called ambulance which brought him to the hospital for the same.  There is no reported head injury, no reported loss of consciousness.  Review of his previous medical history shows that he has diagnosis of Parkinson's disease, advanced dementia, seizure disorder, hyperlipidemia, essential hypertension.  Review of medications does not show any anticoagulation.   HPI     Prior to Admission medications   Medication Sig Start Date End Date Taking? Authorizing Provider  rivastigmine (EXELON) 1.5 MG capsule Take 1.5 mg by mouth 2 (two) times daily. agitation 11/10/23  Yes [provider]  acetaminophen  (TYLENOL ) 325 MG tablet Take 2 tablets (650 mg total) by mouth every 6 (six) hours as needed for mild pain (pain score 1-3) or fever (or Fever >/= 101). 10/23/23   Rosario Leatrice FERNS, MD  cloNIDine  (CATAPRES  - DOSED IN MG/24 HR) 0.2 mg/24hr patch Place 1 patch (0.2 mg total) onto the skin once a week. 10/23/23   Rosario Leatrice FERNS, MD  levETIRAcetam  (KEPPRA ) 750 MG tablet Take 1 tablet (750 mg total) by mouth 2 (two) times daily. 10/23/23   Rosario Leatrice FERNS, MD  metoprolol  tartrate (LOPRESSOR ) 25 MG tablet Take 1.5 tablets (37.5 mg total) by mouth 2 (two) times daily. 10/23/23   Rosario Leatrice FERNS, MD  QUEtiapine  (SEROQUEL ) 25 MG tablet Take 25-50 mg by mouth at bedtime.    [provider]  risperiDONE  (RISPERDAL ) 0.5 MG tablet Take 0.25-0.5 mg by mouth 2 (two) times daily.    [provider]  risperiDONE   (RISPERDAL ) 2 MG tablet Take 1 tablet (2 mg total) by mouth 2 (two) times daily. 11/10/23   [name], MD  rosuvastatin  (CRESTOR ) 5 MG tablet Take 1 tablet (5 mg total) by mouth daily. 10/23/23   Rosario Leatrice FERNS, MD  tamsulosin  (FLOMAX ) 0.4 MG CAPS capsule TAKE 1 CAPSULE EVERY DAY 11/08/23   [name], MD    Allergies: Dilantin  [phenytoin ]    Review of Systems  Skin:  Positive for wound.  All other systems reviewed and are negative.   Updated Vital Signs BP 138/73 (BP Location: Left Arm)   Pulse 69   Temp 97.9 F (36.6 C)   Resp 18   SpO2 96%   Physical Exam Vitals and nursing note reviewed.  Constitutional:      General: He is not in acute distress.    Appearance: Normal appearance.  HENT:     Head: Normocephalic and atraumatic.     Mouth/Throat:     Mouth: Mucous membranes are moist.     Pharynx: Oropharynx is clear.  Eyes:     Extraocular Movements: Extraocular movements intact.     Conjunctiva/sclera: Conjunctivae normal.     Pupils: Pupils are equal, round, and reactive to light.  Cardiovascular:     Rate and Rhythm: Normal rate and regular rhythm.     Pulses: Normal pulses.     Heart sounds: Normal heart  sounds. No murmur heard.    No friction rub. No gallop.  Pulmonary:     Effort: Pulmonary effort is normal.     Breath sounds: Normal breath sounds.  Abdominal:     General: Abdomen is flat. Bowel sounds are normal.     Palpations: Abdomen is soft.  Musculoskeletal:        General: Normal range of motion.     Cervical back: Normal, normal range of motion and neck supple.     Thoracic back: Normal.     Lumbar back: Normal.     Right lower leg: No edema.     Left lower leg: No edema.     Comments: No gross deformities are appreciated in the upper extremities bilaterally, no deformities are noted to the lower extremities bilaterally.  No pain to palpation of the cervical spine,  Skin:    General: Skin is warm and dry.     Capillary Refill:  Capillary refill takes less than 2 seconds.     Comments: Substantial skin tear to the dorsum of the right hand, approximately 10 cm in length.  Neurological:     General: No focal deficit present.     Mental Status: He is alert. Mental status is at baseline.  Psychiatric:        Mood and Affect: Mood normal.     (all labs ordered are listed, but only abnormal results are displayed) Labs Reviewed - No data to display  EKG: None  Radiology: DG Forearm Right Result Date: 12/19/2023 CLINICAL DATA:  fall onto right hand/wrist EXAM: RIGHT FOREARM - 2 VIEW COMPARISON:  None Available. FINDINGS: The distal ulna and ulnar styloid is excluded from the field of view.No acute fracture or dislocation. There is no evidence of arthropathy or other focal bone abnormality. Soft tissues are unremarkable. IMPRESSION: No acute fracture or dislocation. Electronically Signed   By: Rogelia Myers M.D.   On: 12/19/2023 15:20   DG Hand 2 View Right Result Date: 12/19/2023 CLINICAL DATA:  [id number] Fall [id number] EXAM: RIGHT HAND - 2 VIEW COMPARISON:  None Available. FINDINGS: No acute fracture or dislocation. Soft tissues are unremarkable. No radiopaque foreign body. IMPRESSION: No acute fracture or dislocation. Electronically Signed   By: Rogelia Myers M.D.   On: 12/19/2023 15:18   CT Head Wo Contrast Result Date: 12/19/2023 CLINICAL DATA:  Status post fall. EXAM: CT HEAD WITHOUT CONTRAST TECHNIQUE: Contiguous axial images were obtained from the base of the skull through the vertex without intravenous contrast. RADIATION DOSE REDUCTION: This exam was performed according to the departmental dose-optimization program which includes automated exposure control, adjustment of the mA and/or kV according to patient size and/or use of iterative reconstruction technique. COMPARISON:  October 11, 2023 FINDINGS: Brain: There is generalized cerebral atrophy with widening of the extra-axial spaces and ventricular dilatation. There  are areas of decreased attenuation within the white matter tracts of the supratentorial brain, consistent with microvascular disease changes. Chronic cystic encephalomalacia is seen within the left frontal lobe. Vascular: Marked severity bilateral cavernous carotid artery calcification is noted. Skull: A left frontal craniotomy defect is seen. Sinuses/Orbits: No acute finding. Other: None. IMPRESSION: 1. Generalized cerebral atrophy and microvascular disease changes of the supratentorial brain. 2. Chronic left frontal lobe encephalomalacia. 3. No acute intracranial abnormality. Electronically Signed   By: Suzen Dials M.D.   On: 12/19/2023 14:53     .Laceration Repair  Date/Time: 12/19/2023 2:30 PM  Performed by: Myriam Dorn BROCKS, PA Authorized by:  Myriam Dorn BROCKS, PA   Consent:    Consent obtained:  Verbal   Consent given by:  Spouse and patient   Risks, benefits, and alternatives were discussed: yes     Risks discussed:  Infection, pain, need for additional repair and poor wound healing   Alternatives discussed:  No treatment, delayed treatment, observation and referral Universal protocol:    Procedure explained and questions answered to patient or proxy's satisfaction: yes     Patient identity confirmed:  Verbally with patient, arm band and hospital-assigned identification number Anesthesia:    Anesthesia method:  Topical application   Topical anesthetic:  LET Laceration details:    Location:  Hand   Hand location:  R hand, dorsum   Length (cm):  10   Depth (mm):  0 Exploration:    Limited defect created (wound extended): no     Hemostasis achieved with:  LET and direct pressure   Imaging obtained: x-ray     Imaging outcome: foreign body not noted     Wound exploration: wound explored through full range of motion and entire depth of wound visualized     Contaminated: no   Treatment:    Area cleansed with:  Shur-Clens   Amount of cleaning:  Standard   Irrigation  solution:  Sterile saline   Irrigation volume:  100   Irrigation method:  Syringe   Debridement:  None   Undermining:  None   Scar revision: no   Skin repair:    Repair method:  Steri-Strips   Number of Steri-Strips:  5 Approximation:    Approximation:  Close Repair type:    Repair type:  Simple Post-procedure details:    Dressing:  Non-adherent dressing and bulky dressing   Procedure completion:  Tolerated well, no immediate complications    Medications Ordered in the ED  lidocaine -EPINEPHrine -tetracaine  (LET) topical gel (3 mLs Topical Given 12/19/23 1422)                                    Medical Decision Making Amount and/or Complexity of Data Reviewed Radiology: ordered.   Patient complaint of fall with likely distracting injury and history of dementia, CT of the head and neck was ordered to rule out acute intracranial bleed as well as to rule out fracture to the cranium and to the C-spine.  These are negative for fracture and negative for intracranial bleed.  Obtained x-ray imaging of the right hand and forearm to evaluate for fracture secondary to fall onto the affected areas.  These are also negative of any acute fracture.  As such, as noted in the procedure note closed the wound with micro mend wound closure devices.  Wrapped with sterile nonadhesive dressing and bulky dressing.  Plan at this time is to discharge with outpatient follow-up for wound management.  As imaging findings are reassuring and physical exam do not show any other signs of injury, he does not have any dyspnea, and according to family is at his mental status baseline, find he is stable for discharge and outpatient management at this time.     Final diagnoses:  Skin tear of right hand without complication, initial encounter    ED Discharge Orders     None          Myriam Dorn BROCKS, [place] 12/19/23 1535    Charlyn Sora, MD 12/23/23 [phone]

## 2023-12-21 ENCOUNTER — Telehealth: Payer: Self-pay | Admitting: *Deleted

## 2023-12-21 NOTE — Telephone Encounter (Signed)
 I reviewed the ED note. They applied steri strips to the wound. Normally we leave these alone and let them fall off naturally (in a week or two)

## 2023-12-21 NOTE — Telephone Encounter (Signed)
 Copied from CRM #[id number]. Topic: General - Other >> Dec 21, 2023  8:36 AM Carlyon D wrote: Reason for CRM: pt wife calling pt fell Tuesday pt fell went to [facility] busted the top of hand skin just came off put clamp like things on instead of stiches.  Pt wife is stating pt has strip clamps and does not know  how to remove and when to remove them as the ER did not tell her anything in regards to any information from ER. Please reach out to pt wife to discuss further

## 2023-12-22 DIAGNOSIS — G20A1 Parkinson's disease without dyskinesia, without mention of fluctuations: Secondary | ICD-10-CM | POA: Diagnosis not present

## 2023-12-22 DIAGNOSIS — S0101XA Laceration without foreign body of scalp, initial encounter: Secondary | ICD-10-CM | POA: Diagnosis not present

## 2023-12-22 DIAGNOSIS — I69398 Other sequelae of cerebral infarction: Secondary | ICD-10-CM | POA: Diagnosis not present

## 2023-12-22 DIAGNOSIS — Z9181 History of falling: Secondary | ICD-10-CM | POA: Diagnosis not present

## 2023-12-22 DIAGNOSIS — I10 Essential (primary) hypertension: Secondary | ICD-10-CM | POA: Diagnosis not present

## 2023-12-22 DIAGNOSIS — G3183 Dementia with Lewy bodies: Secondary | ICD-10-CM | POA: Diagnosis not present

## 2023-12-22 NOTE — Telephone Encounter (Signed)
 Left a detailed message on pt spouse mobile number, advised to cal the office back

## 2023-12-24 DIAGNOSIS — S0101XA Laceration without foreign body of scalp, initial encounter: Secondary | ICD-10-CM | POA: Diagnosis not present

## 2023-12-24 DIAGNOSIS — I10 Essential (primary) hypertension: Secondary | ICD-10-CM | POA: Diagnosis not present

## 2023-12-24 DIAGNOSIS — I69398 Other sequelae of cerebral infarction: Secondary | ICD-10-CM | POA: Diagnosis not present

## 2023-12-24 DIAGNOSIS — G3183 Dementia with Lewy bodies: Secondary | ICD-10-CM | POA: Diagnosis not present

## 2023-12-24 DIAGNOSIS — G20A1 Parkinson's disease without dyskinesia, without mention of fluctuations: Secondary | ICD-10-CM | POA: Diagnosis not present

## 2023-12-24 DIAGNOSIS — Z9181 History of falling: Secondary | ICD-10-CM | POA: Diagnosis not present

## 2023-12-25 ENCOUNTER — Inpatient Hospital Stay: Admitting: Family Medicine

## 2023-12-25 DIAGNOSIS — G20A1 Parkinson's disease without dyskinesia, without mention of fluctuations: Secondary | ICD-10-CM | POA: Diagnosis not present

## 2023-12-25 DIAGNOSIS — I10 Essential (primary) hypertension: Secondary | ICD-10-CM | POA: Diagnosis not present

## 2023-12-25 DIAGNOSIS — R451 Restlessness and agitation: Secondary | ICD-10-CM | POA: Diagnosis not present

## 2023-12-25 DIAGNOSIS — R351 Nocturia: Secondary | ICD-10-CM | POA: Diagnosis not present

## 2023-12-25 DIAGNOSIS — Z9181 History of falling: Secondary | ICD-10-CM | POA: Diagnosis not present

## 2023-12-25 DIAGNOSIS — I69398 Other sequelae of cerebral infarction: Secondary | ICD-10-CM | POA: Diagnosis not present

## 2023-12-25 DIAGNOSIS — G3183 Dementia with Lewy bodies: Secondary | ICD-10-CM | POA: Diagnosis not present

## 2023-12-25 DIAGNOSIS — S0101XA Laceration without foreign body of scalp, initial encounter: Secondary | ICD-10-CM | POA: Diagnosis not present

## 2023-12-25 DIAGNOSIS — N401 Enlarged prostate with lower urinary tract symptoms: Secondary | ICD-10-CM | POA: Diagnosis not present

## 2023-12-27 DIAGNOSIS — G3183 Dementia with Lewy bodies: Secondary | ICD-10-CM | POA: Diagnosis not present

## 2023-12-27 DIAGNOSIS — D638 Anemia in other chronic diseases classified elsewhere: Secondary | ICD-10-CM | POA: Diagnosis not present

## 2023-12-27 DIAGNOSIS — Z9181 History of falling: Secondary | ICD-10-CM | POA: Diagnosis not present

## 2023-12-27 DIAGNOSIS — G4730 Sleep apnea, unspecified: Secondary | ICD-10-CM | POA: Diagnosis not present

## 2023-12-27 DIAGNOSIS — N401 Enlarged prostate with lower urinary tract symptoms: Secondary | ICD-10-CM | POA: Diagnosis not present

## 2023-12-27 DIAGNOSIS — E785 Hyperlipidemia, unspecified: Secondary | ICD-10-CM | POA: Diagnosis not present

## 2023-12-27 DIAGNOSIS — G40909 Epilepsy, unspecified, not intractable, without status epilepticus: Secondary | ICD-10-CM | POA: Diagnosis not present

## 2023-12-27 DIAGNOSIS — I69398 Other sequelae of cerebral infarction: Secondary | ICD-10-CM | POA: Diagnosis not present

## 2023-12-27 DIAGNOSIS — G20A1 Parkinson's disease without dyskinesia, without mention of fluctuations: Secondary | ICD-10-CM | POA: Diagnosis not present

## 2023-12-27 DIAGNOSIS — S0101XA Laceration without foreign body of scalp, initial encounter: Secondary | ICD-10-CM | POA: Diagnosis not present

## 2023-12-27 DIAGNOSIS — I10 Essential (primary) hypertension: Secondary | ICD-10-CM | POA: Diagnosis not present

## 2023-12-28 DIAGNOSIS — G3183 Dementia with Lewy bodies: Secondary | ICD-10-CM | POA: Diagnosis not present

## 2023-12-28 DIAGNOSIS — I69398 Other sequelae of cerebral infarction: Secondary | ICD-10-CM | POA: Diagnosis not present

## 2023-12-28 DIAGNOSIS — G20A1 Parkinson's disease without dyskinesia, without mention of fluctuations: Secondary | ICD-10-CM | POA: Diagnosis not present

## 2023-12-28 DIAGNOSIS — I10 Essential (primary) hypertension: Secondary | ICD-10-CM | POA: Diagnosis not present

## 2023-12-28 DIAGNOSIS — Z9181 History of falling: Secondary | ICD-10-CM | POA: Diagnosis not present

## 2023-12-28 DIAGNOSIS — S0101XA Laceration without foreign body of scalp, initial encounter: Secondary | ICD-10-CM | POA: Diagnosis not present

## 2023-12-29 DIAGNOSIS — S0101XA Laceration without foreign body of scalp, initial encounter: Secondary | ICD-10-CM | POA: Diagnosis not present

## 2023-12-29 DIAGNOSIS — I10 Essential (primary) hypertension: Secondary | ICD-10-CM | POA: Diagnosis not present

## 2023-12-29 DIAGNOSIS — Z9181 History of falling: Secondary | ICD-10-CM | POA: Diagnosis not present

## 2023-12-29 DIAGNOSIS — G20A1 Parkinson's disease without dyskinesia, without mention of fluctuations: Secondary | ICD-10-CM | POA: Diagnosis not present

## 2023-12-29 DIAGNOSIS — I69398 Other sequelae of cerebral infarction: Secondary | ICD-10-CM | POA: Diagnosis not present

## 2023-12-29 DIAGNOSIS — G3183 Dementia with Lewy bodies: Secondary | ICD-10-CM | POA: Diagnosis not present

## 2023-12-30 DIAGNOSIS — I10 Essential (primary) hypertension: Secondary | ICD-10-CM | POA: Diagnosis not present

## 2023-12-30 DIAGNOSIS — I69398 Other sequelae of cerebral infarction: Secondary | ICD-10-CM | POA: Diagnosis not present

## 2023-12-30 DIAGNOSIS — S0101XA Laceration without foreign body of scalp, initial encounter: Secondary | ICD-10-CM | POA: Diagnosis not present

## 2023-12-30 DIAGNOSIS — G3183 Dementia with Lewy bodies: Secondary | ICD-10-CM | POA: Diagnosis not present

## 2023-12-30 DIAGNOSIS — Z9181 History of falling: Secondary | ICD-10-CM | POA: Diagnosis not present

## 2023-12-30 DIAGNOSIS — G20A1 Parkinson's disease without dyskinesia, without mention of fluctuations: Secondary | ICD-10-CM | POA: Diagnosis not present

## 2024-01-01 DIAGNOSIS — G20A1 Parkinson's disease without dyskinesia, without mention of fluctuations: Secondary | ICD-10-CM | POA: Diagnosis not present

## 2024-01-01 DIAGNOSIS — I69398 Other sequelae of cerebral infarction: Secondary | ICD-10-CM | POA: Diagnosis not present

## 2024-01-01 DIAGNOSIS — I10 Essential (primary) hypertension: Secondary | ICD-10-CM | POA: Diagnosis not present

## 2024-01-01 DIAGNOSIS — G3183 Dementia with Lewy bodies: Secondary | ICD-10-CM | POA: Diagnosis not present

## 2024-01-01 DIAGNOSIS — Z9181 History of falling: Secondary | ICD-10-CM | POA: Diagnosis not present

## 2024-01-01 DIAGNOSIS — S0101XA Laceration without foreign body of scalp, initial encounter: Secondary | ICD-10-CM | POA: Diagnosis not present

## 2024-01-02 DIAGNOSIS — I69398 Other sequelae of cerebral infarction: Secondary | ICD-10-CM | POA: Diagnosis not present

## 2024-01-02 DIAGNOSIS — G20A1 Parkinson's disease without dyskinesia, without mention of fluctuations: Secondary | ICD-10-CM | POA: Diagnosis not present

## 2024-01-02 DIAGNOSIS — Z9181 History of falling: Secondary | ICD-10-CM | POA: Diagnosis not present

## 2024-01-02 DIAGNOSIS — G3183 Dementia with Lewy bodies: Secondary | ICD-10-CM | POA: Diagnosis not present

## 2024-01-02 DIAGNOSIS — S0101XA Laceration without foreign body of scalp, initial encounter: Secondary | ICD-10-CM | POA: Diagnosis not present

## 2024-01-02 DIAGNOSIS — I10 Essential (primary) hypertension: Secondary | ICD-10-CM | POA: Diagnosis not present

## 2024-01-03 DIAGNOSIS — I10 Essential (primary) hypertension: Secondary | ICD-10-CM | POA: Diagnosis not present

## 2024-01-03 DIAGNOSIS — Z9181 History of falling: Secondary | ICD-10-CM | POA: Diagnosis not present

## 2024-01-03 DIAGNOSIS — S0101XA Laceration without foreign body of scalp, initial encounter: Secondary | ICD-10-CM | POA: Diagnosis not present

## 2024-01-03 DIAGNOSIS — G20A1 Parkinson's disease without dyskinesia, without mention of fluctuations: Secondary | ICD-10-CM | POA: Diagnosis not present

## 2024-01-03 DIAGNOSIS — I69398 Other sequelae of cerebral infarction: Secondary | ICD-10-CM | POA: Diagnosis not present

## 2024-01-03 DIAGNOSIS — G3183 Dementia with Lewy bodies: Secondary | ICD-10-CM | POA: Diagnosis not present

## 2024-01-04 DIAGNOSIS — G3183 Dementia with Lewy bodies: Secondary | ICD-10-CM | POA: Diagnosis not present

## 2024-01-04 DIAGNOSIS — I10 Essential (primary) hypertension: Secondary | ICD-10-CM | POA: Diagnosis not present

## 2024-01-04 DIAGNOSIS — I69398 Other sequelae of cerebral infarction: Secondary | ICD-10-CM | POA: Diagnosis not present

## 2024-01-04 DIAGNOSIS — S0101XA Laceration without foreign body of scalp, initial encounter: Secondary | ICD-10-CM | POA: Diagnosis not present

## 2024-01-04 DIAGNOSIS — G20A1 Parkinson's disease without dyskinesia, without mention of fluctuations: Secondary | ICD-10-CM | POA: Diagnosis not present

## 2024-01-04 DIAGNOSIS — Z9181 History of falling: Secondary | ICD-10-CM | POA: Diagnosis not present

## 2024-01-05 DIAGNOSIS — G3183 Dementia with Lewy bodies: Secondary | ICD-10-CM | POA: Diagnosis not present

## 2024-01-05 DIAGNOSIS — I10 Essential (primary) hypertension: Secondary | ICD-10-CM | POA: Diagnosis not present

## 2024-01-05 DIAGNOSIS — I69398 Other sequelae of cerebral infarction: Secondary | ICD-10-CM | POA: Diagnosis not present

## 2024-01-05 DIAGNOSIS — Z9181 History of falling: Secondary | ICD-10-CM | POA: Diagnosis not present

## 2024-01-05 DIAGNOSIS — S0101XA Laceration without foreign body of scalp, initial encounter: Secondary | ICD-10-CM | POA: Diagnosis not present

## 2024-01-05 DIAGNOSIS — G20A1 Parkinson's disease without dyskinesia, without mention of fluctuations: Secondary | ICD-10-CM | POA: Diagnosis not present

## 2024-01-06 DIAGNOSIS — I10 Essential (primary) hypertension: Secondary | ICD-10-CM | POA: Diagnosis not present

## 2024-01-06 DIAGNOSIS — G20A1 Parkinson's disease without dyskinesia, without mention of fluctuations: Secondary | ICD-10-CM | POA: Diagnosis not present

## 2024-01-06 DIAGNOSIS — Z9181 History of falling: Secondary | ICD-10-CM | POA: Diagnosis not present

## 2024-01-06 DIAGNOSIS — I69398 Other sequelae of cerebral infarction: Secondary | ICD-10-CM | POA: Diagnosis not present

## 2024-01-06 DIAGNOSIS — S0101XA Laceration without foreign body of scalp, initial encounter: Secondary | ICD-10-CM | POA: Diagnosis not present

## 2024-01-06 DIAGNOSIS — G3183 Dementia with Lewy bodies: Secondary | ICD-10-CM | POA: Diagnosis not present

## 2024-01-07 DIAGNOSIS — I69398 Other sequelae of cerebral infarction: Secondary | ICD-10-CM | POA: Diagnosis not present

## 2024-01-07 DIAGNOSIS — G3183 Dementia with Lewy bodies: Secondary | ICD-10-CM | POA: Diagnosis not present

## 2024-01-07 DIAGNOSIS — G20A1 Parkinson's disease without dyskinesia, without mention of fluctuations: Secondary | ICD-10-CM | POA: Diagnosis not present

## 2024-01-07 DIAGNOSIS — S0101XA Laceration without foreign body of scalp, initial encounter: Secondary | ICD-10-CM | POA: Diagnosis not present

## 2024-01-07 DIAGNOSIS — Z9181 History of falling: Secondary | ICD-10-CM | POA: Diagnosis not present

## 2024-01-07 DIAGNOSIS — I10 Essential (primary) hypertension: Secondary | ICD-10-CM | POA: Diagnosis not present

## 2024-01-08 DIAGNOSIS — G3183 Dementia with Lewy bodies: Secondary | ICD-10-CM | POA: Diagnosis not present

## 2024-01-08 DIAGNOSIS — S0101XA Laceration without foreign body of scalp, initial encounter: Secondary | ICD-10-CM | POA: Diagnosis not present

## 2024-01-08 DIAGNOSIS — I10 Essential (primary) hypertension: Secondary | ICD-10-CM | POA: Diagnosis not present

## 2024-01-08 DIAGNOSIS — I69398 Other sequelae of cerebral infarction: Secondary | ICD-10-CM | POA: Diagnosis not present

## 2024-01-08 DIAGNOSIS — G20A1 Parkinson's disease without dyskinesia, without mention of fluctuations: Secondary | ICD-10-CM | POA: Diagnosis not present

## 2024-01-08 DIAGNOSIS — Z9181 History of falling: Secondary | ICD-10-CM | POA: Diagnosis not present

## 2024-01-09 DIAGNOSIS — S0101XA Laceration without foreign body of scalp, initial encounter: Secondary | ICD-10-CM | POA: Diagnosis not present

## 2024-01-09 DIAGNOSIS — I69398 Other sequelae of cerebral infarction: Secondary | ICD-10-CM | POA: Diagnosis not present

## 2024-01-09 DIAGNOSIS — Z9181 History of falling: Secondary | ICD-10-CM | POA: Diagnosis not present

## 2024-01-09 DIAGNOSIS — I10 Essential (primary) hypertension: Secondary | ICD-10-CM | POA: Diagnosis not present

## 2024-01-09 DIAGNOSIS — G3183 Dementia with Lewy bodies: Secondary | ICD-10-CM | POA: Diagnosis not present

## 2024-01-09 DIAGNOSIS — G20A1 Parkinson's disease without dyskinesia, without mention of fluctuations: Secondary | ICD-10-CM | POA: Diagnosis not present

## 2024-01-10 DIAGNOSIS — Z9181 History of falling: Secondary | ICD-10-CM | POA: Diagnosis not present

## 2024-01-10 DIAGNOSIS — I10 Essential (primary) hypertension: Secondary | ICD-10-CM | POA: Diagnosis not present

## 2024-01-10 DIAGNOSIS — S0101XA Laceration without foreign body of scalp, initial encounter: Secondary | ICD-10-CM | POA: Diagnosis not present

## 2024-01-10 DIAGNOSIS — I69398 Other sequelae of cerebral infarction: Secondary | ICD-10-CM | POA: Diagnosis not present

## 2024-01-10 DIAGNOSIS — G3183 Dementia with Lewy bodies: Secondary | ICD-10-CM | POA: Diagnosis not present

## 2024-01-10 DIAGNOSIS — G20A1 Parkinson's disease without dyskinesia, without mention of fluctuations: Secondary | ICD-10-CM | POA: Diagnosis not present

## 2024-01-11 DIAGNOSIS — S0101XA Laceration without foreign body of scalp, initial encounter: Secondary | ICD-10-CM | POA: Diagnosis not present

## 2024-01-11 DIAGNOSIS — I10 Essential (primary) hypertension: Secondary | ICD-10-CM | POA: Diagnosis not present

## 2024-01-11 DIAGNOSIS — G20A1 Parkinson's disease without dyskinesia, without mention of fluctuations: Secondary | ICD-10-CM | POA: Diagnosis not present

## 2024-01-11 DIAGNOSIS — I69398 Other sequelae of cerebral infarction: Secondary | ICD-10-CM | POA: Diagnosis not present

## 2024-01-11 DIAGNOSIS — Z9181 History of falling: Secondary | ICD-10-CM | POA: Diagnosis not present

## 2024-01-11 DIAGNOSIS — G3183 Dementia with Lewy bodies: Secondary | ICD-10-CM | POA: Diagnosis not present

## 2024-01-12 DIAGNOSIS — I69398 Other sequelae of cerebral infarction: Secondary | ICD-10-CM | POA: Diagnosis not present

## 2024-01-12 DIAGNOSIS — G3183 Dementia with Lewy bodies: Secondary | ICD-10-CM | POA: Diagnosis not present

## 2024-01-12 DIAGNOSIS — Z9181 History of falling: Secondary | ICD-10-CM | POA: Diagnosis not present

## 2024-01-12 DIAGNOSIS — G20A1 Parkinson's disease without dyskinesia, without mention of fluctuations: Secondary | ICD-10-CM | POA: Diagnosis not present

## 2024-01-12 DIAGNOSIS — S0101XA Laceration without foreign body of scalp, initial encounter: Secondary | ICD-10-CM | POA: Diagnosis not present

## 2024-01-12 DIAGNOSIS — I10 Essential (primary) hypertension: Secondary | ICD-10-CM | POA: Diagnosis not present

## 2024-01-13 DIAGNOSIS — G20A1 Parkinson's disease without dyskinesia, without mention of fluctuations: Secondary | ICD-10-CM | POA: Diagnosis not present

## 2024-01-13 DIAGNOSIS — S0101XA Laceration without foreign body of scalp, initial encounter: Secondary | ICD-10-CM | POA: Diagnosis not present

## 2024-01-13 DIAGNOSIS — I10 Essential (primary) hypertension: Secondary | ICD-10-CM | POA: Diagnosis not present

## 2024-01-13 DIAGNOSIS — G3183 Dementia with Lewy bodies: Secondary | ICD-10-CM | POA: Diagnosis not present

## 2024-01-13 DIAGNOSIS — Z9181 History of falling: Secondary | ICD-10-CM | POA: Diagnosis not present

## 2024-01-13 DIAGNOSIS — I69398 Other sequelae of cerebral infarction: Secondary | ICD-10-CM | POA: Diagnosis not present

## 2024-01-14 DIAGNOSIS — I10 Essential (primary) hypertension: Secondary | ICD-10-CM | POA: Diagnosis not present

## 2024-01-14 DIAGNOSIS — I69398 Other sequelae of cerebral infarction: Secondary | ICD-10-CM | POA: Diagnosis not present

## 2024-01-14 DIAGNOSIS — Z9181 History of falling: Secondary | ICD-10-CM | POA: Diagnosis not present

## 2024-01-14 DIAGNOSIS — G3183 Dementia with Lewy bodies: Secondary | ICD-10-CM | POA: Diagnosis not present

## 2024-01-14 DIAGNOSIS — G20A1 Parkinson's disease without dyskinesia, without mention of fluctuations: Secondary | ICD-10-CM | POA: Diagnosis not present

## 2024-01-14 DIAGNOSIS — S0101XA Laceration without foreign body of scalp, initial encounter: Secondary | ICD-10-CM | POA: Diagnosis not present

## 2024-01-15 DIAGNOSIS — G3183 Dementia with Lewy bodies: Secondary | ICD-10-CM | POA: Diagnosis not present

## 2024-01-15 DIAGNOSIS — I69398 Other sequelae of cerebral infarction: Secondary | ICD-10-CM | POA: Diagnosis not present

## 2024-01-15 DIAGNOSIS — G20A1 Parkinson's disease without dyskinesia, without mention of fluctuations: Secondary | ICD-10-CM | POA: Diagnosis not present

## 2024-01-15 DIAGNOSIS — I10 Essential (primary) hypertension: Secondary | ICD-10-CM | POA: Diagnosis not present

## 2024-01-15 DIAGNOSIS — S0101XA Laceration without foreign body of scalp, initial encounter: Secondary | ICD-10-CM | POA: Diagnosis not present

## 2024-01-15 DIAGNOSIS — Z9181 History of falling: Secondary | ICD-10-CM | POA: Diagnosis not present

## 2024-01-27 DEATH — deceased
